# Patient Record
Sex: Female | Born: 1965 | Race: White | Hispanic: No | Marital: Single | Smoking: Current every day smoker
Health system: Southern US, Community
[De-identification: ages and names within clinical notes are randomized; demographics above are authoritative.]

## PROBLEM LIST (undated history)

## (undated) ENCOUNTER — Emergency Department (HOSPITAL_COMMUNITY): Admission: EM | Payer: Self-pay | Source: Home / Self Care

## (undated) DIAGNOSIS — I10 Essential (primary) hypertension: Secondary | ICD-10-CM

## (undated) DIAGNOSIS — J45909 Unspecified asthma, uncomplicated: Secondary | ICD-10-CM

## (undated) DIAGNOSIS — J189 Pneumonia, unspecified organism: Secondary | ICD-10-CM

## (undated) DIAGNOSIS — S0990XA Unspecified injury of head, initial encounter: Secondary | ICD-10-CM

## (undated) DIAGNOSIS — Z9181 History of falling: Secondary | ICD-10-CM

## (undated) DIAGNOSIS — R413 Other amnesia: Secondary | ICD-10-CM

## (undated) DIAGNOSIS — I6200 Nontraumatic subdural hemorrhage, unspecified: Secondary | ICD-10-CM

## (undated) DIAGNOSIS — K219 Gastro-esophageal reflux disease without esophagitis: Secondary | ICD-10-CM

## (undated) HISTORY — PX: BRAIN SURGERY: SHX531

## (undated) HISTORY — DX: Essential (primary) hypertension: I10

## (undated) HISTORY — PX: HERNIA REPAIR: SHX51

---

## 2002-01-28 ENCOUNTER — Emergency Department (HOSPITAL_COMMUNITY): Admission: EM | Admit: 2002-01-28 | Discharge: 2002-01-28 | Payer: Self-pay | Admitting: Emergency Medicine

## 2002-07-05 ENCOUNTER — Inpatient Hospital Stay (HOSPITAL_COMMUNITY): Admission: EM | Admit: 2002-07-05 | Discharge: 2002-07-11 | Payer: Self-pay | Admitting: Psychiatry

## 2005-06-01 ENCOUNTER — Observation Stay (HOSPITAL_COMMUNITY): Admission: EM | Admit: 2005-06-01 | Discharge: 2005-06-03 | Payer: Self-pay | Admitting: Emergency Medicine

## 2005-06-02 ENCOUNTER — Ambulatory Visit: Payer: Self-pay | Admitting: Gastroenterology

## 2005-10-01 ENCOUNTER — Inpatient Hospital Stay (HOSPITAL_COMMUNITY): Admission: EM | Admit: 2005-10-01 | Discharge: 2005-10-05 | Payer: Self-pay | Admitting: Emergency Medicine

## 2005-10-01 ENCOUNTER — Encounter: Payer: Self-pay | Admitting: Obstetrics & Gynecology

## 2005-10-02 ENCOUNTER — Encounter (INDEPENDENT_AMBULATORY_CARE_PROVIDER_SITE_OTHER): Payer: Self-pay | Admitting: *Deleted

## 2006-02-27 ENCOUNTER — Emergency Department (HOSPITAL_COMMUNITY): Admission: EM | Admit: 2006-02-27 | Discharge: 2006-02-27 | Payer: Self-pay | Admitting: Emergency Medicine

## 2006-09-17 ENCOUNTER — Emergency Department (HOSPITAL_COMMUNITY): Admission: EM | Admit: 2006-09-17 | Discharge: 2006-09-18 | Payer: Self-pay | Admitting: Emergency Medicine

## 2008-06-06 ENCOUNTER — Emergency Department (HOSPITAL_COMMUNITY): Admission: EM | Admit: 2008-06-06 | Discharge: 2008-06-06 | Payer: Self-pay | Admitting: Emergency Medicine

## 2008-09-22 ENCOUNTER — Ambulatory Visit: Payer: Self-pay | Admitting: Pulmonary Disease

## 2008-09-22 ENCOUNTER — Encounter: Payer: Self-pay | Admitting: Emergency Medicine

## 2008-09-22 ENCOUNTER — Inpatient Hospital Stay (HOSPITAL_COMMUNITY): Admission: AD | Admit: 2008-09-22 | Discharge: 2008-10-21 | Payer: Self-pay | Admitting: Neurosurgery

## 2008-09-22 ENCOUNTER — Encounter (INDEPENDENT_AMBULATORY_CARE_PROVIDER_SITE_OTHER): Payer: Self-pay | Admitting: Neurosurgery

## 2008-09-22 DIAGNOSIS — F1910 Other psychoactive substance abuse, uncomplicated: Secondary | ICD-10-CM

## 2008-09-28 ENCOUNTER — Ambulatory Visit: Payer: Self-pay | Admitting: Physical Medicine & Rehabilitation

## 2008-10-23 ENCOUNTER — Emergency Department (HOSPITAL_COMMUNITY): Admission: EM | Admit: 2008-10-23 | Discharge: 2008-10-23 | Payer: Self-pay | Admitting: Emergency Medicine

## 2008-10-27 ENCOUNTER — Emergency Department (HOSPITAL_COMMUNITY): Admission: EM | Admit: 2008-10-27 | Discharge: 2008-10-27 | Payer: Self-pay | Admitting: Emergency Medicine

## 2008-11-03 ENCOUNTER — Emergency Department (HOSPITAL_COMMUNITY): Admission: EM | Admit: 2008-11-03 | Discharge: 2008-11-03 | Payer: Self-pay | Admitting: Family Medicine

## 2008-11-18 ENCOUNTER — Emergency Department (HOSPITAL_COMMUNITY): Admission: EM | Admit: 2008-11-18 | Discharge: 2008-11-18 | Payer: Self-pay | Admitting: Emergency Medicine

## 2008-11-23 ENCOUNTER — Ambulatory Visit (HOSPITAL_COMMUNITY): Admission: RE | Admit: 2008-11-23 | Discharge: 2008-11-23 | Payer: Self-pay | Admitting: Neurosurgery

## 2008-12-07 ENCOUNTER — Emergency Department (HOSPITAL_COMMUNITY): Admission: EM | Admit: 2008-12-07 | Discharge: 2008-12-07 | Payer: Self-pay | Admitting: Emergency Medicine

## 2009-01-20 ENCOUNTER — Encounter: Admission: RE | Admit: 2009-01-20 | Discharge: 2009-01-20 | Payer: Self-pay | Admitting: Unknown Physician Specialty

## 2009-02-09 ENCOUNTER — Encounter: Admission: RE | Admit: 2009-02-09 | Discharge: 2009-02-09 | Payer: Self-pay | Admitting: Unknown Physician Specialty

## 2009-04-02 ENCOUNTER — Ambulatory Visit: Payer: Self-pay | Admitting: Internal Medicine

## 2009-04-02 ENCOUNTER — Inpatient Hospital Stay (HOSPITAL_COMMUNITY): Admission: EM | Admit: 2009-04-02 | Discharge: 2009-04-10 | Payer: Self-pay | Admitting: Emergency Medicine

## 2009-04-08 ENCOUNTER — Encounter (INDEPENDENT_AMBULATORY_CARE_PROVIDER_SITE_OTHER): Payer: Self-pay | Admitting: Internal Medicine

## 2009-04-08 DIAGNOSIS — J189 Pneumonia, unspecified organism: Secondary | ICD-10-CM | POA: Insufficient documentation

## 2009-04-08 DIAGNOSIS — F319 Bipolar disorder, unspecified: Secondary | ICD-10-CM | POA: Insufficient documentation

## 2009-04-08 DIAGNOSIS — R569 Unspecified convulsions: Secondary | ICD-10-CM | POA: Insufficient documentation

## 2009-04-08 DIAGNOSIS — I6200 Nontraumatic subdural hemorrhage, unspecified: Secondary | ICD-10-CM

## 2009-04-08 HISTORY — DX: Nontraumatic subdural hemorrhage, unspecified: I62.00

## 2009-05-11 ENCOUNTER — Emergency Department (HOSPITAL_COMMUNITY): Admission: EM | Admit: 2009-05-11 | Discharge: 2009-05-11 | Payer: Self-pay | Admitting: Emergency Medicine

## 2010-04-23 ENCOUNTER — Encounter: Payer: Self-pay | Admitting: Unknown Physician Specialty
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
## 2010-06-18 LAB — STREP PNEUMONIAE ANTIBODY SEROTYPES
Strep pneumo Type 12: 0.31 ug/mL
Strep pneumo Type 19: 1.29 ug/mL
Strep pneumo Type 4: 0.08 ug/mL
Strep pneumo Type 9: 3.9 ug/mL
Strep pneumoniae Type 18C Abs: 9.76 ug/mL
Strep pneumoniae Type 23F Abs: 0.19 ug/mL
Strep pneumoniae Type 3 Abs: 0.27 ug/mL
Strep pneumoniae Type 7F Abs: 0.76 ug/mL
Strep pneumoniae Type 8 Abs: 3.23 ug/mL
Strep pneumoniae Type 9N Abs: 2.19 ug/mL

## 2010-06-18 LAB — BASIC METABOLIC PANEL
BUN: 12 mg/dL (ref 6–23)
BUN: 13 mg/dL (ref 6–23)
BUN: 13 mg/dL (ref 6–23)
BUN: 6 mg/dL (ref 6–23)
BUN: 7 mg/dL (ref 6–23)
BUN: 8 mg/dL (ref 6–23)
CO2: 27 mEq/L (ref 19–32)
CO2: 27 mEq/L (ref 19–32)
CO2: 28 mEq/L (ref 19–32)
Calcium: 7.8 mg/dL — ABNORMAL LOW (ref 8.4–10.5)
Calcium: 7.8 mg/dL — ABNORMAL LOW (ref 8.4–10.5)
Calcium: 8 mg/dL — ABNORMAL LOW (ref 8.4–10.5)
Calcium: 8.2 mg/dL — ABNORMAL LOW (ref 8.4–10.5)
Calcium: 8.4 mg/dL (ref 8.4–10.5)
Chloride: 107 mEq/L (ref 96–112)
Chloride: 111 mEq/L (ref 96–112)
Creatinine, Ser: 0.5 mg/dL (ref 0.4–1.2)
Creatinine, Ser: 0.67 mg/dL (ref 0.4–1.2)
Creatinine, Ser: 0.71 mg/dL (ref 0.4–1.2)
GFR calc Af Amer: >60 mL/min (ref >60)
GFR calc Af Amer: >60 mL/min (ref >60)
GFR calc Af Amer: >60 mL/min (ref >60)
GFR calc Af Amer: >60 mL/min (ref >60)
GFR calc non Af Amer: >60 mL/min (ref >60)
GFR calc non Af Amer: >60 mL/min (ref >60)
GFR calc non Af Amer: >60 mL/min (ref >60)
GFR calc non Af Amer: >60 mL/min (ref >60)
GFR calc non Af Amer: >60 mL/min (ref >60)
GFR calc non Af Amer: >60 mL/min (ref >60)
Glucose, Bld: 117 mg/dL — ABNORMAL HIGH (ref 70–99)
Glucose, Bld: 72 mg/dL (ref 70–99)
Glucose, Bld: 82 mg/dL (ref 70–99)
Glucose, Bld: 96 mg/dL (ref 70–99)
Glucose, Bld: 97 mg/dL (ref 70–99)
Potassium: 3.4 mEq/L — ABNORMAL LOW (ref 3.5–5.1)
Potassium: 4 mEq/L (ref 3.5–5.1)
Potassium: 4 mEq/L (ref 3.5–5.1)
Potassium: 4.1 mEq/L (ref 3.5–5.1)
Sodium: 136 mEq/L (ref 135–145)
Sodium: 137 mEq/L (ref 135–145)
Sodium: 139 mEq/L (ref 135–145)
Sodium: 141 mEq/L (ref 135–145)
Sodium: 141 mEq/L (ref 135–145)
Sodium: 141 mEq/L (ref 135–145)
Sodium: 143 mEq/L (ref 135–145)

## 2010-06-18 LAB — HEMOGLOBIN A1C
Hgb A1c MFr Bld: 5.5 % (ref 4.6–6.1)
Mean Plasma Glucose: 111 mg/dL

## 2010-06-18 LAB — CARDIAC PANEL(CRET KIN+CKTOT+MB+TROPI)
CK, MB: 1.3 ng/mL (ref 0.3–4.0)
Relative Index: INVALID (ref 0.0–2.5)
Relative Index: INVALID (ref 0.0–2.5)
Total CK: 36 U/L (ref 7–177)
Troponin I: 0.01 ng/mL (ref 0.00–0.06)
Troponin I: 0.03 ng/mL (ref 0.00–0.06)

## 2010-06-18 LAB — CBC
HCT: 28.6 % — ABNORMAL LOW (ref 36.0–46.0)
HCT: 28.7 % — ABNORMAL LOW (ref 36.0–46.0)
HCT: 31.1 % — ABNORMAL LOW (ref 36.0–46.0)
HCT: 31.2 % — ABNORMAL LOW (ref 36.0–46.0)
HCT: 33.2 % — ABNORMAL LOW (ref 36.0–46.0)
Hemoglobin: 10.7 g/dL — ABNORMAL LOW (ref 12.0–15.0)
Hemoglobin: 10.8 g/dL — ABNORMAL LOW (ref 12.0–15.0)
Hemoglobin: 9.6 g/dL — ABNORMAL LOW (ref 12.0–15.0)
Hemoglobin: 9.9 g/dL — ABNORMAL LOW (ref 12.0–15.0)
MCHC: 34.5 g/dL (ref 30.0–36.0)
MCHC: 34.6 g/dL (ref 30.0–36.0)
MCHC: 35.1 g/dL (ref 30.0–36.0)
MCHC: 35.3 g/dL (ref 30.0–36.0)
MCV: 97.7 fL (ref 78.0–100.0)
Platelets: 157 10*3/uL (ref 150–400)
Platelets: 173 10*3/uL (ref 150–400)
Platelets: 190 10*3/uL (ref 150–400)
Platelets: 192 10*3/uL (ref 150–400)
Platelets: 288 10*3/uL (ref 150–400)
Platelets: 348 10*3/uL (ref 150–400)
Platelets: 396 10*3/uL (ref 150–400)
RBC: 3.13 MIL/uL — ABNORMAL LOW (ref 3.87–5.11)
RBC: 3.18 MIL/uL — ABNORMAL LOW (ref 3.87–5.11)
RBC: 3.19 MIL/uL — ABNORMAL LOW (ref 3.87–5.11)
RDW: 15.2 % (ref 11.5–15.5)
RDW: 15.3 % (ref 11.5–15.5)
RDW: 15.4 % (ref 11.5–15.5)
RDW: 15.5 % (ref 11.5–15.5)
RDW: 15.6 % — ABNORMAL HIGH (ref 11.5–15.5)
RDW: 15.6 % — ABNORMAL HIGH (ref 11.5–15.5)
WBC: 5.8 10*3/uL (ref 4.0–10.5)
WBC: 8.5 10*3/uL (ref 4.0–10.5)
WBC: 9.8 10*3/uL (ref 4.0–10.5)
WBC: 9.9 10*3/uL (ref 4.0–10.5)

## 2010-06-18 LAB — BRAIN NATRIURETIC PEPTIDE: Pro B Natriuretic peptide (BNP): 243 pg/mL — ABNORMAL HIGH (ref 0.0–100.0)

## 2010-06-18 LAB — CULTURE, BLOOD (ROUTINE X 2): Culture: NO GROWTH

## 2010-06-18 LAB — CK TOTAL AND CKMB (NOT AT ARMC): CK, MB: 1 ng/mL (ref 0.3–4.0)

## 2010-06-18 LAB — DIFFERENTIAL
Basophils Relative: 0 % (ref 0–1)
Eosinophils Absolute: 0 10*3/uL (ref 0.0–0.7)
Eosinophils Relative: 0 % (ref 0–5)
Lymphs Abs: 0.5 10*3/uL — ABNORMAL LOW (ref 0.7–4.0)
Monocytes Absolute: 0.2 10*3/uL (ref 0.1–1.0)
Neutro Abs: 5.1 10*3/uL (ref 1.7–7.7)

## 2010-06-18 LAB — COMPREHENSIVE METABOLIC PANEL
ALT: 21 U/L (ref 0–35)
AST: 20 U/L (ref 0–37)
Albumin: 3 g/dL — ABNORMAL LOW (ref 3.5–5.2)
Alkaline Phosphatase: 49 U/L (ref 39–117)
BUN: 18 mg/dL (ref 6–23)
Chloride: 104 mEq/L (ref 96–112)
GFR calc Af Amer: >60 mL/min (ref >60)
Potassium: 3.3 mEq/L — ABNORMAL LOW (ref 3.5–5.1)
Sodium: 139 mEq/L (ref 135–145)
Total Bilirubin: 1 mg/dL (ref 0.3–1.2)

## 2010-06-18 LAB — LIPID PANEL
LDL Cholesterol: 73 mg/dL (ref 0–99)
Total CHOL/HDL Ratio: 10 RATIO
VLDL: 35 mg/dL (ref 0–40)

## 2010-06-18 LAB — BLOOD GAS, ARTERIAL
Bicarbonate: 25.2 mEq/L — ABNORMAL HIGH (ref 20.0–24.0)
O2 Content: 2 L/min
O2 Saturation: 94.8 %
pCO2 arterial: 43.1 mmHg (ref 35.0–45.0)
pO2, Arterial: 77.6 mmHg — ABNORMAL LOW (ref 80.0–100.0)

## 2010-06-18 LAB — LEGIONELLA ANTIGEN, URINE: Legionella Antigen, Urine: NEGATIVE

## 2010-06-18 LAB — URINE CULTURE: Culture: NO GROWTH

## 2010-06-18 LAB — PHOSPHORUS: Phosphorus: 1.2 mg/dL — ABNORMAL LOW (ref 2.3–4.6)

## 2010-06-18 LAB — MAGNESIUM: Magnesium: 1.8 mg/dL (ref 1.5–2.5)

## 2010-07-09 LAB — URINE MICROSCOPIC-ADD ON

## 2010-07-09 LAB — POCT I-STAT, CHEM 8
Creatinine, Ser: 0.9 mg/dL (ref 0.4–1.2)
Glucose, Bld: 83 mg/dL (ref 70–99)
Hemoglobin: 10.5 g/dL — ABNORMAL LOW (ref 12.0–15.0)
Sodium: 141 mEq/L (ref 135–145)
TCO2: 23 mmol/L (ref 0–100)

## 2010-07-09 LAB — URINALYSIS, ROUTINE W REFLEX MICROSCOPIC
Glucose, UA: NEGATIVE mg/dL
Nitrite: NEGATIVE
Specific Gravity, Urine: 1.028 (ref 1.005–1.030)
pH: 6.5 (ref 5.0–8.0)

## 2010-07-10 LAB — ETHANOL: Alcohol, Ethyl (B): <5 mg/dL (ref 0–10)

## 2010-07-10 LAB — RAPID URINE DRUG SCREEN, HOSP PERFORMED
Amphetamines: NOT DETECTED
Barbiturates: NOT DETECTED
Opiates: NOT DETECTED

## 2010-07-10 LAB — CROSSMATCH: Antibody Screen: NEGATIVE

## 2010-07-10 LAB — BLOOD GAS, ARTERIAL
Acid-Base Excess: 1.4 mmol/L (ref 0.0–2.0)
Bicarbonate: 21.8 mEq/L (ref 20.0–24.0)
Bicarbonate: 23.1 mEq/L (ref 20.0–24.0)
Bicarbonate: 24.6 mEq/L — ABNORMAL HIGH (ref 20.0–24.0)
Bicarbonate: 24.7 mEq/L — ABNORMAL HIGH (ref 20.0–24.0)
FIO2: 0.5 %
O2 Saturation: 98.3 %
O2 Saturation: 99.2 %
O2 Saturation: 97.8 %
O2 Saturation: 98.7 %
PEEP: 5 cmH2O
PEEP: 5 cmH2O
Patient temperature: 37
Patient temperature: 37
Patient temperature: 98.6
TCO2: 19.7 mmol/L (ref 0–100)
TCO2: 21.8 mmol/L (ref 0–100)
TCO2: 25.6 mmol/L (ref 0–100)
pH, Arterial: 7.416 — ABNORMAL HIGH (ref 7.350–7.400)
pO2, Arterial: 205 mmHg — ABNORMAL HIGH (ref 80.0–100.0)
pO2, Arterial: 131 mmHg — ABNORMAL HIGH (ref 80.0–100.0)

## 2010-07-10 LAB — URINALYSIS, ROUTINE W REFLEX MICROSCOPIC
Glucose, UA: NEGATIVE mg/dL
Leukocytes, UA: NEGATIVE
Specific Gravity, Urine: >1.03 — ABNORMAL HIGH (ref 1.005–1.030)
pH: 5.5 (ref 5.0–8.0)

## 2010-07-10 LAB — BASIC METABOLIC PANEL
BUN: 10 mg/dL (ref 6–23)
BUN: 6 mg/dL (ref 6–23)
CO2: 26 mEq/L (ref 19–32)
CO2: 26 mEq/L (ref 19–32)
CO2: 28 mEq/L (ref 19–32)
Calcium: 7 mg/dL — ABNORMAL LOW (ref 8.4–10.5)
Chloride: 105 mEq/L (ref 96–112)
Chloride: 109 mEq/L (ref 96–112)
Chloride: 113 mEq/L — ABNORMAL HIGH (ref 96–112)
Creatinine, Ser: 0.52 mg/dL (ref 0.4–1.2)
Creatinine, Ser: 0.73 mg/dL (ref 0.4–1.2)
GFR calc Af Amer: >60 mL/min (ref >60)
GFR calc non Af Amer: >60 mL/min (ref >60)
Glucose, Bld: 190 mg/dL — ABNORMAL HIGH (ref 70–99)
Potassium: 3.4 mEq/L — ABNORMAL LOW (ref 3.5–5.1)
Sodium: 138 mEq/L (ref 135–145)
Sodium: 140 mEq/L (ref 135–145)
Sodium: 143 mEq/L (ref 135–145)

## 2010-07-10 LAB — CBC
HCT: 37.5 % (ref 36.0–46.0)
Hemoglobin: 13.1 g/dL (ref 12.0–15.0)
Hemoglobin: 9.1 g/dL — ABNORMAL LOW (ref 12.0–15.0)
Hemoglobin: 9.7 g/dL — ABNORMAL LOW (ref 12.0–15.0)
MCHC: 34.4 g/dL (ref 30.0–36.0)
MCHC: 34.4 g/dL (ref 30.0–36.0)
MCV: 96.6 fL (ref 78.0–100.0)
RBC: 3.92 MIL/uL (ref 3.87–5.11)
RBC: 2.92 MIL/uL — ABNORMAL LOW (ref 3.87–5.11)
RDW: 12.7 % (ref 11.5–15.5)
WBC: 15.9 10*3/uL — ABNORMAL HIGH (ref 4.0–10.5)

## 2010-07-10 LAB — COMPREHENSIVE METABOLIC PANEL
Albumin: 3.7 g/dL (ref 3.5–5.2)
Alkaline Phosphatase: 82 U/L (ref 39–117)
BUN: 15 mg/dL (ref 6–23)
Chloride: 110 mEq/L (ref 96–112)
Glucose, Bld: 89 mg/dL (ref 70–99)
Potassium: 4.2 mEq/L (ref 3.5–5.1)
Total Bilirubin: 0.4 mg/dL (ref 0.3–1.2)

## 2010-07-10 LAB — URINE MICROSCOPIC-ADD ON

## 2010-07-10 LAB — POCT CARDIAC MARKERS
CKMB, poc: 2 ng/mL (ref 1.0–8.0)
Myoglobin, poc: 169 ng/mL (ref 12–200)
Troponin i, poc: <0.05 ng/mL (ref 0.00–0.09)

## 2010-07-10 LAB — SALICYLATE LEVEL
Salicylate Lvl: 26.1 mg/dL — ABNORMAL HIGH (ref 2.8–20.0)
Salicylate Lvl: 16.2 mg/dL (ref 2.8–20.0)
Salicylate Lvl: 5.8 mg/dL (ref 2.8–20.0)

## 2010-07-10 LAB — DIFFERENTIAL
Basophils Absolute: 0 10*3/uL (ref 0.0–0.1)
Basophils Relative: 0 % (ref 0–1)
Monocytes Absolute: 0.5 10*3/uL (ref 0.1–1.0)
Neutro Abs: 14.2 10*3/uL — ABNORMAL HIGH (ref 1.7–7.7)

## 2010-07-10 LAB — ABO/RH
ABO/RH(D): O POS
ABO/RH(D): O POS

## 2010-07-13 LAB — DIFFERENTIAL
Basophils Absolute: 0 10*3/uL (ref 0.0–0.1)
Lymphocytes Relative: 28 % (ref 12–46)
Monocytes Absolute: 0.4 10*3/uL (ref 0.1–1.0)
Neutro Abs: 3.5 10*3/uL (ref 1.7–7.7)
Neutrophils Relative %: 60 % (ref 43–77)

## 2010-07-13 LAB — COMPREHENSIVE METABOLIC PANEL
Albumin: 3.8 g/dL (ref 3.5–5.2)
BUN: 6 mg/dL (ref 6–23)
Chloride: 106 mEq/L (ref 96–112)
Creatinine, Ser: 0.75 mg/dL (ref 0.4–1.2)
Total Bilirubin: 0.4 mg/dL (ref 0.3–1.2)
Total Protein: 6.9 g/dL (ref 6.0–8.3)

## 2010-07-13 LAB — CBC
HCT: 40.4 % (ref 36.0–46.0)
MCV: 96.1 fL (ref 78.0–100.0)
Platelets: 374 10*3/uL (ref 150–400)
RDW: 12.6 % (ref 11.5–15.5)

## 2010-07-13 LAB — URINALYSIS, ROUTINE W REFLEX MICROSCOPIC
Hgb urine dipstick: NEGATIVE
Nitrite: NEGATIVE
Specific Gravity, Urine: <1.005 — ABNORMAL LOW (ref 1.005–1.030)
Urobilinogen, UA: 0.2 mg/dL (ref 0.0–1.0)

## 2010-07-13 LAB — RAPID URINE DRUG SCREEN, HOSP PERFORMED
Barbiturates: NOT DETECTED
Opiates: NOT DETECTED
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
## 2010-08-15 NOTE — Consult Note (Signed)
NAME:  Melanie [name], Melanie [name]                  ACCOUNT NO.:   MEDICAL RECORD NO.:  [id number]           PATIENT TYPE:   LOCATION:                                 FACILITY:   PHYSICIAN:  Antonietta Breach, M.D.  DATE OF BIRTH:  [birth year]   DATE OF CONSULTATION:  10/15/2008  DATE OF DISCHARGE:                                 CONSULTATION   Ms. Greenwood has been having mild depressed mood with seizures every  other day.  She has a history of greater than 3 major depression periods  in her past and has a history of Lexapro being effective.   MENTAL STATUS EXAMINATION:  She is able to name 3/3 words immediate and  2/3 words at 3 minutes.  She knows the year, the month, day of the week  and the day of the month.  She also is oriented to place and person.  Her thought process is normal.  She still has some mild trouble with  word finding.  Her judgment however is within normal limits and her  insight is intact.   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  She does have some residual cognitive and memory deficits,  however, she does have the ability to make a consistent choice.  She can  also differentiate between her options of treatment as well as her  options of post discharge environment and their respective risks versus  benefits.  She can appreciate the nature of her general medical  problems.  She also can reason well.   She does have the capacity to leave the hospital against medical advice  if she chooses to do so.   Major depressive disorder recurrent, moderate   Ms. Hatler is not at risk to harm herself or others.  She agrees to  call emergency services immediately for any thoughts of harming herself,  thoughts of harming others or distress.   The indications, alternatives and adverse effects of Celexa were  discussed with the patient for anti-depression.  She understands and  would like to start Celexa.   Would start Celexa 10 mg p.o.  q.a.m. and then increase as tolerated in 1  week to 20 mg q.a.m.   The indications, alternatives and adverse effects were addressed well,  were discussed with the patient for anti-insomnia.  She is continuing  with insomnia.  The patient understands and would like to proceed with  Restoril with a goal of discontinuing Restoril as her main anti-  depression regimen resolves her insomnia, would not use more than  Restoril 15 mg at bedtime p.r.n. insomnia.   Would ask the social worker to set Ms. Cruise up with an outpatient  psychiatric appointment during the first week of discharge.  She had  also been admitted to cognitive behavioral therapy.     Antonietta Breach, M.D.    JW/MEDQ  D:  09/04/2009  T:  09/05/2009  Job:  [id number]

## 2010-08-15 NOTE — Procedures (Signed)
EEG NUMBER:  [id number]   REFERRING PHYSICIAN:  Danae Orleans. Venetia Maxon, MD.   CLINICAL HISTORY:  A 45 year old woman with left subdural hematoma, now  with confusion, and speech difficulty.  EEG is for evaluation.  The  patient had a previous EEG done on September 30, 2008 associated with mild  background slowing indicative of cerebral neuronal dysfunction, but no  focal findings.  Repeat EEG for evaluation.   DESCRIPTION:  The dominant rhythm tracing is intermittently seen  throughout, and it is seen better on the right than on the left.  This  rhythm is a alpha rhythm of 9 Hz, which predominates posteriorly.  Occasional 9 Hz rhythm are seen on the left, but also on occasion  rhythms are relatively slow and is present on the left in the 6-7 Hz  range.  In addition, there is intermittent generalized slowing, which  occurs throughout, with periods of generalized theta waves and some  frontal dominant delta rhythms.  No epileptiform discharges are seen.  Single channel devoted EKG revealed sinus rhythm throughout with a rate  of approximately 60 beats per minute.   CONCLUSIONS:  Abnormal study due to presence of  1. Mild intermittent generalized slowing of the background rhythms,      findings suggestive of diffuse widespread cerebral dysfunction.  2. Intermittent left-sided slowing, findings suggestive of focal      pathology involving the left side.  No epileptiform discharges are      seen.      Michael L. Thad Ranger, M.D.  Electronically Signed     WUJ:WJXB  D:  10/05/2008 23:57:51  T:  10/06/2008 05:36:10  Job #:  [id number]
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
## 2010-08-18 NOTE — Discharge Summary (Signed)
Melanie [name]             ACCOUNT NO.:  [id number]   MEDICAL RECORD NO.:  [id number]          PATIENT TYPE:  OBV   LOCATION:  A228                          FACILITY:  [facility]   PHYSICIAN:  Margaretmary Dys, M.D.DATE OF BIRTH:  1966-02-19   DATE OF ADMISSION:  05/31/2005  DATE OF DISCHARGE:  03/04/2007LH                                 DISCHARGE SUMMARY   DISCHARGE DIAGNOSES:  1.  Refractory hypoglycemia likely secondary to surreptitious use of an oral      hypoglycemic agent or cocaine binging.  2.  Syncope related to #1 above.  3.  Hepatitis B and C positive.  4.  Polysubstance abuse with marijuana, benzodiazepine, crack cocaine,      opiates, and tobacco.   DIET:  Regular diet.   ACTIVITY:  As tolerated.   SPECIAL INSTRUCTIONS:  1.  The patient was advised to quit the use of illicit drug use.  2.  The patient was advised to inform her sexual partners about hepatitis B      positivity. The patient says her boyfriend is aware.   FOLLOWUP:  The patient is to follow up with Dr. Jena Gauss in two weeks for  results of hepatitis B and C viral loads.   DISCHARGE MEDICATIONS:  None.   HOSPITAL COURSE:  Melanie [name] is a 45 year old Caucasian female with no  significant past medical history who was brought into the emergency room  with severe hypoglycemia. Apparently when EMS arrived at her home her blood  sugar was in the 20s. She did receive an amp of D-50, she became alert and  conscious, and also was given some food en route.  Since her arrival in the  emergency room she dropped her blood sugars again in the 70s, she received  three amps of D-50.  Despite the multiple D-50s her blood sugars remained  refractory low, she was admitted, and started on continuous infusion of 5%  dextrose which was subsequently changed to 10% dextrose as she was receiving  multiple doses of 50%. That seemed to have helped.   About 18-24 hours after admission her blood sugars improved and we were  able  to dicontinue her dextrose infusion without any more episodes of  hypoglycemia.   It is unclear why these hypoglycemic episodes have occurred and an extensive  evaluation was done. The patient had an elevated C-peptide level and also  elevated insulin levels. However, because the patient did not become more  hypoglycemic after we stopped her dextrose infusion. It was unlikely that  this is an insulinoma, would be more in favor of illicit drug use.   The patient also had some vague complaints of abdominal pain of unclear  etiology, although she had a ventral hernia that did not appear to be  obstructed or incarcerated.   On June 03, 2005, the patient was much better, had kept her blood sugars up  from more than 12 hours without supplemental dextrose infusion, and was  discharged in satisfactory condition.   DISPOSITION:  Home.   PERTINENT LABORATORY DATA:  White blood cell count was 9.3 with 88%  neutrophils,  hemoglobin 14, MCV 96,  platelet count 323,000. Sodium was 138,  potassium 3.6, chloride 105, bicarbonate 29, glucose 30, BUN 5, creatinine  0.5, calcium 8.3, alcohol level less than 5.  UA shows many squamous cells  and many bacteria, but leukocytes and nitrites were negative. Ketones were  negative. CT of the abdomen and pelvis showed evidence of ventral hernia,  which may be mildly inflamed; otherwise unremarkable. The head CT scan on  admission was negative with no evidence of intracranial abnormality.   A comprehensive workup for the hypoglycemia was also undertaken and AST was  60, ALT 64, albumin 2.9. C-peptide was high at 8.85, upper limits of normal  is 3.90. Thyroid stimulating hormone was down to 0.345. Hepatitis B surface  antibody and Hepatitis C antibody was also positive. Random insulin level  was 40. Urine toxicology was positive for benzodiazepines, cocaines,  opiates, and marijuana.   DISPOSITION:  I discussed with the endocrinologist on call at  [facility] and she was of the opinion that most likely the patient took  oral hypoglycemic agents or overdosed possibly cocaine binging but she will  be happy to see the patient as an outpatient or even in transfer if her  hypoglycemia stays refractory. As I mentioned above, her blood glucose has  improved and stayed that way. She will be discharged as outlined above. The  patient was recommended for psychiatric counseling which she declined during  this hospitalization.      Margaretmary Dys, M.D.  Electronically Signed     AM/MEDQ  D:  06/03/2005  T:  06/03/2005  Job:  [id number]

## 2010-08-18 NOTE — H&P (Signed)
Melanie [name], Melanie [name]             ACCOUNT NO.:  [id number]   MEDICAL RECORD NO.:  [id number]          PATIENT TYPE:  INP   LOCATION:  1823                         FACILITY:  [facility]   PHYSICIAN:  Cherylynn Ridges, M.D.    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   HISTORY OF PRESENT ILLNESS:  The patient has apparently been by several  medical facilities today, one being [facility], one being a hospital up in  [place] and one being at [facility].  She has a history of a known  left lower quadrant mass and was thought possibly to be ovarian cyst,  however, previous CT scan done in June 01, 2005, demonstrated an  incarcerated ventral hernia with some fat in the subcu.  Her current pain  started yesterday and has just got worse, but she has had minimal vomiting,  mild nausea, but this is mostly related to the pain of the hernia.  She has  had no real obstructive symptoms with projectile vomiting or obstipation.  This mass has maybe gotten slightly larger in size, but she comes in now  mostly because of discomfort which has worsened now because of the constant  pushing on her hernia trying to get it reduced.   PAST MEDICAL HISTORY:  Unremarkable.   PAST SURGICAL HISTORY:  1.  Previous carpal tunnel.  2.  C-sections x2.   SOCIAL HISTORY:  She is a smoker, nondrinker.  Otherwise, in no acute  distress.   REVIEW OF SYSTEMS:  She had a normal bowel movement yesterday.  She is very  hungry currently and wants to eat a regular meal.   PHYSICAL EXAMINATION:  VITAL SIGNS:  She is afebrile.  Other vital signs are  stable.  HEENT:  She is normocephalic, atraumatic and anicteric.  She does look  slightly dry.  Her lips are parched.  Tongue is chapped.  NECK:  Neck  is supple.  No palpable masses.  No bruits.  LUNGS:  Lungs are clear to auscultation.  She has no wheezes, rales or  rhonchi.  CARDIAC:  Regular rhythm and rate with no murmurs, gallops, thrills or  heaves.  ABDOMEN:  Her abdomen has a fullness in the left lower quadrant above the  side of a previous transverse Pfannenstiel incision.  The area is somewhat  tender from Dr. Evette Cristal having attempted to reduce the hernia after being  administered pain medication.  She has normoactive bowel sounds.  RECTAL:  Not performed.  PELVIC:  Not performed.   LABORATORY DATA AND X-RAY FINDINGS:  Normal white count.  She is slightly  anemic with hemoglobin of 11.8 and hematocrit of 33%.  Other laboratories  are normal with the exception of mildly elevated SGOT.   IMPRESSION:  Chronic incarcerated hernia.   PLAN:  Because there is possibility it may have changed since March of this  year, I  am repeating a computed axial tomography scan with contrast  currently to make sure she does not have any bowel herniation and  obstruction.  We will go ahead and admit her for pain control and IV  hydration.      Cherylynn Ridges, M.D.  Electronically Signed     JOW/MEDQ  D:  10/01/2005  T:  10/01/2005  Job:  [id number]
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
## 2010-08-18 NOTE — Discharge Summary (Signed)
Melanie [name], Melanie [name]             ACCOUNT NO.:  [id number]   MEDICAL RECORD NO.:  [id number]          PATIENT TYPE:  INP   LOCATION:  5727                         FACILITY:  [facility]   PHYSICIAN:  Angelia Mould. Derrell Lolling, M.D.DATE OF BIRTH:  [birth year]   DATE OF ADMISSION:  10/01/2005  DATE OF DISCHARGE:  10/05/2005                                 DISCHARGE SUMMARY   CHIEF COMPLAINT AND REASON FOR ADMISSION:  [name] is a healthy female  patient who presented with abdominal pain and chronically incarcerated left  lower quadrant ventral hernia.  The patient had been to [facility],  a hospital in [place], [place] as well as [facility] prior to being  evaluated in the ER at [facility].  Initially they thought her symptoms were  related to an ovarian cyst.  On review of CT scans done in the past in March  of this year the patient had an incarcerated ventral hernia with  subcutaneous fat, but the patient apparently was stable and was discharged  after that CT.  She presented back on the date of admission with pain that  initiated 24 hours prior to presentation and has increased significantly  over a period of less than 24 hours.  She has had nausea and minimal  vomiting.   On clinical exam she had no obstructive symptoms and her main complaint is  of pain.  Again, on exam she has fullness of the left lower quadrant above  the site of a previous transverse Pfannenstiel incision.  She is slightly  tender over this area.  There have been prior attempts during the day to  reduce the hernia after pain medications.  Her bowel sounds were present.  Her white count was normal.  She had some mild baseline anemia.  A repeat CT  scan was done and was pending at time of admission.   ADMITTING DIAGNOSES:  1.  Chronic incarcerated hernia.  2.  Tobacco abuse.   HOSPITAL COURSE:  The patient was admitted to the general medical floor  where she was started on p.r.n. Dilaudid IV for pain  control, IV fluids and  made n.p.o. for possible surgical intervention within the next 24 hours.   CT scan showed a lower middle ventral hernia, findings consistent with  incarceration.  Dr. Derrell Lolling discussed findings with the patient and plans  were made to take the patient to the OR for surgical repair due to increased  complaints of pain preoperatively.  The patient was switched to Dilaudid  PCA.   On October 02, 2005, the patient was taken to the OR by Dr. Derrell Lolling where she  underwent repair of a ventral incarcerated hernia with mesh and partial  omentectomy due to preop and postoperative diagnosis of incarcerated ventral  hernia.  She was in stable condition and sent to the PACU to recover and  subsequently sent back to 5700 for the remainder of the hospitalization.  She also had a J-P drain inserted intraoperatively as well.   Over the next few days the patient did extremely well.  She was requiring  high amounts of Dilaudid due  to complaints of pain.  She had large amounts  of J-P drainage out serosanguineous averaging 100 mL or more over a 24-hour  period.  By postop day 2 she was tolerating a regular diet, ambulating well  but still complaining of significant pain.  At this point attempts were made  to wean the patient off PCA and begin Percocet.  Toradol was also used for  pain.   By October 05, 2005, which was postop day 3, the patient was still complaining  of incisional pain but was ambulating in the room and to the bathroom  without difficulty and was able to navigate toileting without any problems.  Her abdomen was slightly distended but soft.  Staple incision was clean, dry  and intact.  With manual pressure no drainage was noted.  She still had a  significant amount of J-P drainage, 170 mL of serosanguineous, so the J-P  drain was left in place.  She was tolerating Percocet and related that she  does well with the addition of NSAIDs as well and felt the Toradol helped,  so she  will continue over-the-counter ibuprofen at home.  The patient  reports that she is unemployed and has significant problems with obtaining  medications, therefore, the recommendation to use over-the-counter ibuprofen  instead of Toradol.  Also worked with case management to assist the patient  in obtaining 4 dollar [employer] prescription for Pepcid to take while she was  taking the extra doses of ibuprofen.  She was also sent home on Percocet for  pain and she was given instructions by nursing staff regarding how to  manage the J-P drain.  Home health services were also requested to help with  management of the J-P drain until this can be discontinued.   DISCHARGE DIAGNOSES:  1.  Incarcerated ventral hernia status post exploratory laparotomy with      placement of mesh and partial omentectomy.  2.  Tobacco abuse.  3.  Pain issues.   DISCHARGE MEDICATIONS:  1.  Percocet 5/325 one or two every 4 hours as needed for pain #40      dispensed.  2.  Over-the-counter ibuprofen, Advil or Aleve as directed on the bottle.      Please use as needed in between Percocet for pain.  Please take with      food.  3.  Over-the-counter Colace, a stool softener.  Take while using the      Percocet.  4.  Famotidine 20 mg 1 b.i.d. while taking ibuprofen.  This is a Estate agent prescription.   DIET:  No restrictions.   ACTIVITY:  Increase activity slowly.  Sponge bathe while J-P drain is in  place.  No driving while taking the Percocet.  No lifting for 6 weeks.  Return to work no sooner than 6 weeks after your initial surgery date.  Dr.  Derrell Lolling will give you further instructions on when you are allowed to return  to work.   WOUND CARE:  Pat staple line dry after bathing.  You have a J-P drain in  place, keep the bulb compressed, empty at least once daily or when full,  write down all amounts and bring this paperwork with you when you follow up  with Dr. Derrell Lolling.  Elenor Quinones:  You are to see Dr.  Derrell Lolling on July 13 at 4:20 p.m.      Allison L. Rennis Harding, N.P.      Angelia Mould. Derrell Lolling, M.D.  Electronically  Signed    ALE/MEDQ  D:  10/05/2005  T:  10/05/2005  Job:  [id number]

## 2010-08-18 NOTE — Discharge Summary (Signed)
Melanie [name], Melanie [name]                       ACCOUNT NO.:  [id number]   MEDICAL RECORD NO.:  [id number]                   PATIENT TYPE:  IPS   LOCATION:  0508                                 FACILITY:  [facility]   PHYSICIAN:  Jeanice Lim, M.D.              DATE OF BIRTH:  [birth year]   DATE OF ADMISSION:  07/05/2002  DATE OF DISCHARGE:  07/11/2002                                 DISCHARGE SUMMARY   IDENTIFYING DATA:  This is a 45 year old divorced Caucasian female,  involuntarily committed, found on [place], had a note in pocket, suspicious  activity for suicidal ideation, smoking crack on and off for eight years,  had been on a three-week binge.   MEDICATIONS:  None.   ALLERGIES:  No known drug allergies.   PHYSICAL EXAMINATION:  Physical exam essentially within normal limits,  neurologically nonfocal.   ROUTINE ADMISSION LABORATORY DATA:  CBC within normal limits.  Alcohol level  200.  Urine drug screen positive for marijuana.   MENTAL STATUS EXAMINATION:  Alert, well-groomed female with good eye  contact, cooperative, speech clear, mood guilty, uncomfortable, affect  anxious, thought processes goal-directed, cognitively intact, judgment and  insight fair with a history of poor impulse control.   ADMITTING DIAGNOSES:   AXES I:  1. Depression, not otherwise specified.  2. Polysubstance abuse including cocaine, marijuana and possible alcohol     dependence.   AXIS II:  None.   AXIS III:  None.   AXIS IV:  Moderate, relatively related to social environment and limited  support system.   AXIS V:  30/60.   HOSPITAL COURSE:  The patient was admitted, ordered routine p.r.n.  medications, underwent further monitoring and was encouraged to participate  in individual, group and milieu therapy as well as substance abuse  treatment.  The patient was placed on low-dose Librium protocol for safe  detox, Protonix, Phenergan for nausea and Trileptal was started for mood  lability.  The patient was optimized on Trileptal and required Librium  p.r.n. in addition to taper due to withdrawal symptoms.  The patient  gradually reported a positive response, tolerating detox with a slight  extension of taper due to prolonged withdrawal symptoms and reported  improvement without clinical intervention.   CONDITION ON DISCHARGE:  Condition on discharge was markedly improved.  Mood  was more euthymic, affect brighter, thought processes goal-directed, thought  content negative for dangerous ideations or psychotic symptoms.  The patient  denied any acute withdrawal symptoms and reported motivation to be compliant  with the aftercare plan  as well as sober and abstinent.   DISCHARGE MEDICATIONS:  The patient was discharged on medications of:  1. Protonix 40 mg daily.  2. Trileptal 150 mg b.i.d. and two q.h.s.  3. Lexapro 10 mg one-half q.a.m.   FOLLOWUP:  The patient was to follow up at [facility] on July 14, 2002 at 3:30 p.m.  DISCHARGE DIAGNOSES:   AXES I:  1. Depression, not otherwise specified.  2. Polysubstance abuse including cocaine, marijuana and possible alcohol     dependence.   AXIS II:  None.   AXIS III:  None.   AXIS IV:  Moderate, relatively related to social environment and limited  support system.   AXIS V:  Global assessment of functioning on discharge was 50 to 55.                                                    Jeanice Lim, M.D.    JEM/MEDQ  D:  07/29/2002  T:  07/30/2002  Job:  [id number]

## 2010-08-18 NOTE — Group Therapy Note (Signed)
Melanie [name], Melanie [name]             ACCOUNT NO.:  [id number]   MEDICAL RECORD NO.:  [id number]          PATIENT TYPE:  OBV   LOCATION:  A228                          FACILITY:  [facility]   PHYSICIAN:  Margaretmary Dys, M.D.DATE OF BIRTH:  June 12, 1965   DATE OF PROCEDURE:  06/02/2005  DATE OF DISCHARGE:                                   PROGRESS NOTE   SUBJECTIVE:  Patient feels well today, has no significant complaints.  Still  has some vague abdominal pain.  Blood sugars have been better controlled on  10% dextrose and currently range from 141-208.  Patient has been weaned down  to 5% dextrose now.  Patient continues to deny taking any illicit drugs or  oral hyperglycemic agents.   She has no fever or chills.  No diarrhea.   OBJECTIVE:  GENERAL APPEARANCE:  Conscious, alert, comfortable, not in acute  distress.  Well oriented to time, place and person.  VITAL SIGNS:  Blood pressure 125/80, pulse 93, respirations 18, T-max 97.1.  Blood sugar has ranged between 141 to 208.  HEENT:  Normocephalic and atraumatic.  Oral mucosa was moist, no exudates.  NECK:  Supple.  No JVD or lymphadenopathy.  LUNGS:  Clear clinically.  CARDIOVASCULAR:  S1 and S2 regular.  ABDOMEN:  Soft and nontender, bowel sounds positive.  Patient has a ventral  hernia.  EXTREMITIES:  No edema.  NEUROLOGIC:  Grossly intact with no focal neurologic deficits.   LABORATORY/DIAGNOSTIC DATA:  White blood cell count was 5.8, hemoglobin  12.4, hematocrit 35.7, platelet count 264, no left shift.  Sodium 141,  chloride 103, CO2 30, glucose 161, BUN 4, creatinine 0.7.  Total bilirubin  0.5, alkaline phosphatase 134, AST 46, ALT 60, total protein 5.1, albumin  2.7, calcium 8.2.  Amylase level 101.  C peptide was 8.85.  Lipase 36.  TSH  0.345.  Hepatitis B surface antibody was positive.  Hepatitis C antibody was  positive.  Random insulin level was 40.   1.  Hyperglycemia which has improved on dextrose infusions.  It is  still      unclear why patient had hypoglycemia but we still strongly suspect      surreptitious use of sulfonyureas  or a hypoglycemic agent.  She seems      to be improving as she has not had any more hyperglycemia episode      despite being on 5% dextrose.  I discussed with the endocrinologist at      [facility], Dr. Jenelle Mages, who also felt that this was likely due      to some kind of drug overdose, advised waiting for urine levels for      sulfonyurea before undergoing any additional testing for  insulinoma.   1.  Hepatitis C positive with elevated liver function tests.  I will request      GI to see the patient. Will hold off requesting ultrasound or viral load      and defer to GI.  2.  Polysubstance abuse.  Patient continues to deny recent use of illicit      drug use.  ACT team consult has been obtained.  Patient will need some      kind of substance abuse program to help her.      Margaretmary Dys, M.D.  Electronically Signed     AM/MEDQ  D:  06/02/2005  T:  06/02/2005  Job:  [id number]

## 2010-08-18 NOTE — Consult Note (Signed)
[name], [name]             ACCOUNT NO.:  [id number]   MEDICAL RECORD NO.:  [id number]          PATIENT TYPE:  OBV   LOCATION:  A228                          FACILITY:  [facility]   PHYSICIAN:  Kassie Mends, M.D.      DATE OF BIRTH:  May 25, 1965   DATE OF CONSULTATION:  06/02/2005  DATE OF DISCHARGE:                                   CONSULTATION   GASTROENTEROLOGY CONSULTATION:   REQUESTING PHYSICIAN:  Margaretmary Dys, M.D.   REASON FOR CONSULTATION:  Elevated liver enzymes.   HISTORY OF PRESENT ILLNESS:  [name] is a 45 year old female who  presented to the emergency department on June 01, 2005 with mental status  changes, possibly seizure, and nausea and vomiting.  She has a past medical  history of suicide ideations in 2004.  Because [name] was having mental  status changes, her friend who found her called EMS.  They checked her  fingerstick, it was 20.  She was given D-50 and initially responded and then  her blood sugar dropped again.  She again had D-50 given.  She was placed on  D-10 at 150 mL/hr and then D-5 normal saline at 75 mL/hr.  Her fingersticks  have been 34 to 298.  She also complains of left lower quadrant abdominal  pain since before Thanksgiving.  She describes it as sharp.  Sometimes it  radiates to her right lower quadrant but it is primarily in the left lower  quadrant.  She has tried Aleve, Tylenol, and Motrin without relief from her  pain.  She denies any vomiting associated with the pain, diarrhea, black  stools or blood in her stool.  Her evaluation in the ED revealed elevated  liver enzymes. Viral serologies revealed prior HBV infection and current HCV  infection. She reports having a history of hepatitis at age 55.  Ten years  ago she snorted cocaine and shared straws with others.   PAST MEDICAL HISTORY:  Depression.   PAST SURGICAL HISTORY:  C-section x2.   ALLERGIES:  No known drug allergies.   MEDICATIONS:  1.  Dilaudid 100 mg for  12 doses.  2.  Reglan 10 mg for 2 doses.  3.  Lortab x2.  4.  Tylox x2.  5.  Phenergan 12.5 mg x2.   FAMILY HISTORY:  There is a family history of diabetes in her grandparents  and her sister.   SOCIAL HISTORY:  She smokes and rarely drinks alcohol.  She is personal pay  for medicines.   REVIEW OF SYSTEMS:  Per the HPI otherwise all systems are negative except  her last menstrual period was February 14.   PHYSICAL EXAMINATION:  VITAL SIGNS:  Temperature 97.1, blood pressure  125/80, pulse 93.GENERAL:  She is in no apparent distress, alert and  oriented x4.HEENT:  Atraumatic, normocephalic.  Pupils equal and reactive to  light.  Mouth:  No oral lesions, posterior pharynx without erythema or  exudate.  Her neck is without lymphadenopathy, has full range of motion.  CHEST:  Her lungs are clear to auscultation bilaterally.  Her cardiovascular  exam is a  regular rhythm, no murmur, normal S1 and S2.ABDOMEN:  Bowel sounds  are present, soft, nondistended, mild deformity about midline in the lower  quadrant, reproducible abdominal pain with raising her head, shoulders and  arms off the bed and holding consistent with a positive Carnett's  sign.EXTREMITIES:  Without cyanosis, clubbing, or edema.NEUROLOGIC:  She has  no focal neurologic deficits.   LABORATORIES:  Fingersticks are 153 to 175, white blood cell count 5.8 and  hemoglobin 12.4, platelets 264, alcohol less than 5, INR 0.9, potassium 3.6,  total bili 0.5, alk phos 134, AST 46, ALT 60, amylase 101, lipase 36, urine  drug screen positive for benzodiazepines, cocaine, opiates, and marijuana.   RADIOGRAPHIC STUDIES:  CT scan shows mildly dilated pancreatic duct and  common bile duct, moderate to large ventral hernia.   ASSESSMENT:  [name] is a 45 year old female who had a prior hepatitis B  virus infection which is now resolved.  She is infected with hepatitis C for  approximately 10 years.  She has a mildly dilated common bile  duct and  pancreatic duct on CT scan of unknown etiology. Thank you for allowing me to  see [name] in consultation.  My list of recommendations follows.   PLAN:  1.  Suggest endoscopic ultrasound to evaluate pancreatic duct and dilated      common bile duct on CT scan.  If entertaining the diagnosis of      insulinoma would recommend EUS as well.  Endoscopic ultrasound would      also be useful in trying to help identify the mass.  2.  She may receive treatment for hepatitis C as an outpatient.  3.  Discussed with the patient the need to completely avoid alcohol.  She      was told not to share toothbrushes with family members.  She was also      told if she ever develops bleeding that her family should wear gloves if      they are assisting her.  4.  Her left lower quadrant pain is musculoskeletal in origin.  Her CT scan      showed no evidence of incarceration of her ventral hernia.  She should      consider surgery consult as an outpatient if her abdominal pain      persists.      Kassie Mends, M.D.  Electronically Signed     SM/MEDQ  D:  06/02/2005  T:  06/02/2005  Job:  [id number]

## 2010-08-18 NOTE — Op Note (Signed)
Melanie [name], Melanie [name]             ACCOUNT NO.:  [id number]   MEDICAL RECORD NO.:  [id number]          PATIENT TYPE:  INP   LOCATION:  5727                         FACILITY:  [facility]   PHYSICIAN:  Angelia Mould. Derrell Lolling, M.D.DATE OF BIRTH:  Mar 05, 1966   DATE OF PROCEDURE:  10/02/2005  DATE OF DISCHARGE:                                 OPERATIVE REPORT   PREOPERATIVE DIAGNOSIS:  Incarcerated ventral incisional hernia.   POSTOPERATIVE DIAGNOSIS:  Incarcerated ventral incisional hernia.   OPERATION PERFORMED:  Repair incarcerated ventral incisional hernia with  mesh, partial omentectomy.   SURGEON:  Angelia Mould. Derrell Lolling, M.D.   OPERATIVE INDICATIONS:  This is a 45 year old white female who has had two  cesarean sections in the past.  For the past few weeks she has noted a lump  in her left lower quadrant.  It became quite painful, recently; and it was  so severe that she came to the emergency room last night and was admitted to  this hospital by Dr. Jimmye Norman.  A CT scan shows a midline ventral  incisional hernia with a large volume of fatty tissue incarcerated in the  subcutaneous tissue through a more narrowed defect.  The small intestine and  colon did not appear to be entering the defect.  The patient remained quite  tender on the mass, but had no GI symptoms, no fever or leukocytosis.  It  was her desire to go ahead and have this repaired, because of her pain; and  she was brought to operating room for that purpose.   OPERATIVE TECHNIQUE:  Following the induction of general endotracheal  anesthesia a Foley catheter was inserted.  Intravenous antibiotics were  given.  The patient's abdomen was prepped and draped in a sterile fashion.  A lower midline incision was made and the dissection carried down through  the subcutaneous tissue until we encountered a hernia sac.  The hernia sac,  itself, was probably 8 cm in diameter or greater.  We dissected this away  from the subcutaneous tissue  and then dissected down to the abdominal wall  fascia.  We dissected the hernia sac away from the fascial edge until we had  completely freed up the hernia sac, and the underlying omentum.  We could  then pass our finger into the peritoneal cavity; and run it all the way  around 360-degrees and see that there were no other incarcerated tissues.  There was a lot of bleeding from the omentum requiring resection of part of  the omentum.   We placed about 8 clamps on the inferior part of the omentum, divided the  bleeding part of the omentum, and removed it.  The omental vessels were then  ligated with 2-0 Vicryl ties.  This provided good hemostasis and the omentum  was healthy and pink.  It was returned the abdominal cavity.  We could see,  or feel no other abnormalities.  The fascia was then closed in the midline  with 5 interrupted sutures of #1 Novofil.  We irrigated the wound.  We took  a 3 inch x 6 inch piece of polypropylene  mesh; and then sewed it as an  anterior onlay repair with 8 interrupted mattress sutures of #0 Prolene.  We  irrigated the wound.  Hemostasis was good.  A 19-French Blake drain was  placed in the wound; and brought out in the left lower quadrant through the  previous Pfannenstiel incision.  This was sutured to the skin with a nylon  suture and connected to a suction bulb.  The subcutaneous tissue was closed  with  interrupted sutures of 2-0 Vicryl.  The skin was closed with skin staples.  Clean bandages were placed; and the patient taken to the recovery room in  stable condition.  Estimated blood loss was about 30 mL. Complications none.  Sponge, needle, and instrument counts were correct.      Angelia Mould. Derrell Lolling, M.D.  Electronically Signed     HMI/MEDQ  D:  10/02/2005  T:  10/02/2005  Job:  [id number]

## 2010-08-18 NOTE — H&P (Signed)
Melanie [name], Melanie [name]                       ACCOUNT NO.:  [id number]   MEDICAL RECORD NO.:  [id number]                   PATIENT TYPE:  IPS   LOCATION:  0508                                 FACILITY:  [facility]   PHYSICIAN:  Jeanice Lim, M.D.              DATE OF BIRTH:  12-03-1965   DATE OF ADMISSION:  07/05/2002  DATE OF DISCHARGE:  07/11/2002                         PSYCHIATRIC ADMISSION ASSESSMENT   IDENTIFYING INFORMATION:  This is a 45 year old divorced white female  involuntarily committed on July 05, 2002.   HISTORY OF PRESENT ILLNESS:  The patient presents on commitment.  The  patient was found [place] a bridge, caught by a motorist, who had called the  sheriff due to her suspicious activities.  She denies that she was trying to  jump.  She states it was not a suicide attempt.  She states she did have a  note in her pocket written to loved ones but denies it was a suicide note.  She reports some depression.  She has been doing some drinking and doing  drugs and smoking some crack cocaine.  She states she drank a pint on July 05, 2002.  Also has been smoking crack cocaine on and off for the past eight  years with a three-week history of binging.  She also reports she has done  some binge-drinking.  She denies any psychotic symptoms.  She is motivated  to remain clean.  She reports she has experienced some mood swings in the  past.   PAST PSYCHIATRIC HISTORY:  First hospitalization at [facility].  Was at [facility] last year for depression.  No other psychiatric  admissions.  No history of a suicide attempt.  She sees Dr. Jenne Campus in  [facility].  Has a history of detox from alcohol.  Her  longest history of sobriety has been 3-4 months.  That was from December to  March.   SOCIAL HISTORY:  This is a 45 year old divorced white female, divorced for  eight years.  First marriage.  Two children, age 25 and 13.  Lives with her  parents.  Her 40 year old  is with her ex-husband.  Her 6-year-old remains  with her.  She stays at home.  She reports legal problems.  No history of a  DUI.   FAMILY HISTORY:  None.   ALCOHOL/DRUG HISTORY:  Smokes a pack a day.  She reports she binge-drinks on  liquor.  Her last drink was on July 05, 2002 where she drank a pint.  She  does not drink in the mornings.  She denies any blackouts or seizures.  She  has been smoking crack cocaine, binging for the past three weeks.  Smoking  on and off for the past eight years.  States her last cocaine use was the  day before admission.   PRIMARY CARE PHYSICIAN:  Dr. Gaye Pollack.   MEDICAL PROBLEMS:  None.  MEDICATIONS:  None.   ALLERGIES:  No known allergies.   PHYSICAL EXAMINATION:  Done at [facility].  The patient is a young,  middle-aged female in no acute distress.  Her available vital signs were  temperature 97.4, heart rate 101, respirations 18, blood pressure 120/70.  She is 4 feet 11 inches tall.  She is 114 pounds.   LABORATORY DATA:  CBC is within normal limits.  Alcohol level is 200.  Urine  drug screen was positive for cocaine and positive for marijuana.   MENTAL STATUS EXAM:  Alert, well-groomed female with good eye contact.  Cooperative.  Speech is clear.  Mood:  The patient feels guilty and  uncomfortable.  She seems anxious.  Her thought processes are coherent with  no evidence of psychosis.  No auditory or visual hallucinations.  No  suicidal or homicidal ideation.  Cognitive function intact.  Memory is good.  Judgment is fair.  Insight is fair.  Poor impulse control.   DIAGNOSES:   AXIS I:  1. Depressive disorder not otherwise specified.  2. Polysubstance abuse.  3. Rule out bipolar disorder.   AXIS II:  Deferred.   AXIS III:  None.   AXIS IV:  Problems with primary support group, social environment, other  psychosocial problems.   AXIS V:  Current 35; past year 15.   PLAN:  Involuntary commitment for depression,  questionable suicidal  ideation, polysubstance abuse.  Contract for safety.  Check every 15  minutes.  Stabilize mood and thinking.  Will initiate the low-dose Librium  to detox safely.  Have clear liquids available for nausea, Protonix for  gastritis.  Will initiate a mood stabilizer.  The patient is to remain  alcohol and drug-free.  Medication-compliance was discussed with the  patient.  The patient to follow up with Dr. Jenne Campus.   TENTATIVE LENGTH OF STAY:  Three to five days.     Landry Corporal, N.P.                       Jeanice Lim, M.D.    JO/MEDQ  D:  07/27/2002  T:  07/28/2002  Job:  [phone]

## 2011-01-17 LAB — URINALYSIS, ROUTINE W REFLEX MICROSCOPIC
Glucose, UA: NEGATIVE
Hgb urine dipstick: NEGATIVE
Specific Gravity, Urine: <1.005 — ABNORMAL LOW
pH: 6.5

## 2011-01-17 LAB — WET PREP, GENITAL
Trich, Wet Prep: NONE SEEN
WBC, Wet Prep HPF POC: NONE SEEN
Yeast Wet Prep HPF POC: NONE SEEN

## 2011-01-17 LAB — GC/CHLAMYDIA PROBE AMP, GENITAL: GC Probe Amp, Genital: NEGATIVE

## 2011-11-23 ENCOUNTER — Inpatient Hospital Stay (HOSPITAL_COMMUNITY)
Admission: EM | Admit: 2011-11-23 | Discharge: 2011-11-30 | DRG: 871 | Disposition: A | Discharge status: Skilled Nursing Facility | Payer: Medicaid Other | Attending: Pulmonary Disease | Admitting: Pulmonary Disease

## 2011-11-23 ENCOUNTER — Emergency Department (HOSPITAL_COMMUNITY): Payer: Medicaid Other

## 2011-11-23 ENCOUNTER — Encounter (HOSPITAL_COMMUNITY): Payer: Self-pay

## 2011-11-23 DIAGNOSIS — J45909 Unspecified asthma, uncomplicated: Secondary | ICD-10-CM | POA: Diagnosis present

## 2011-11-23 DIAGNOSIS — L0291 Cutaneous abscess, unspecified: Secondary | ICD-10-CM

## 2011-11-23 DIAGNOSIS — E871 Hypo-osmolality and hyponatremia: Secondary | ICD-10-CM | POA: Diagnosis present

## 2011-11-23 DIAGNOSIS — R6521 Severe sepsis with septic shock: Secondary | ICD-10-CM | POA: Diagnosis present

## 2011-11-23 DIAGNOSIS — J9600 Acute respiratory failure, unspecified whether with hypoxia or hypercapnia: Secondary | ICD-10-CM

## 2011-11-23 DIAGNOSIS — D703 Neutropenia due to infection: Secondary | ICD-10-CM | POA: Diagnosis present

## 2011-11-23 DIAGNOSIS — F319 Bipolar disorder, unspecified: Secondary | ICD-10-CM

## 2011-11-23 DIAGNOSIS — K219 Gastro-esophageal reflux disease without esophagitis: Secondary | ICD-10-CM | POA: Diagnosis present

## 2011-11-23 DIAGNOSIS — R197 Diarrhea, unspecified: Secondary | ICD-10-CM | POA: Diagnosis present

## 2011-11-23 DIAGNOSIS — E079 Disorder of thyroid, unspecified: Secondary | ICD-10-CM | POA: Diagnosis present

## 2011-11-23 DIAGNOSIS — F1910 Other psychoactive substance abuse, uncomplicated: Secondary | ICD-10-CM

## 2011-11-23 DIAGNOSIS — Z9181 History of falling: Secondary | ICD-10-CM

## 2011-11-23 DIAGNOSIS — A403 Sepsis due to Streptococcus pneumoniae: Principal | ICD-10-CM | POA: Diagnosis present

## 2011-11-23 DIAGNOSIS — R413 Other amnesia: Secondary | ICD-10-CM | POA: Diagnosis present

## 2011-11-23 DIAGNOSIS — I6200 Nontraumatic subdural hemorrhage, unspecified: Secondary | ICD-10-CM

## 2011-11-23 DIAGNOSIS — E876 Hypokalemia: Secondary | ICD-10-CM | POA: Diagnosis present

## 2011-11-23 DIAGNOSIS — R404 Transient alteration of awareness: Secondary | ICD-10-CM | POA: Diagnosis present

## 2011-11-23 DIAGNOSIS — A419 Sepsis, unspecified organism: Secondary | ICD-10-CM

## 2011-11-23 DIAGNOSIS — F17200 Nicotine dependence, unspecified, uncomplicated: Secondary | ICD-10-CM | POA: Diagnosis present

## 2011-11-23 DIAGNOSIS — R6520 Severe sepsis without septic shock: Secondary | ICD-10-CM | POA: Diagnosis present

## 2011-11-23 DIAGNOSIS — Z23 Encounter for immunization: Secondary | ICD-10-CM

## 2011-11-23 DIAGNOSIS — D696 Thrombocytopenia, unspecified: Secondary | ICD-10-CM

## 2011-11-23 DIAGNOSIS — J189 Pneumonia, unspecified organism: Secondary | ICD-10-CM

## 2011-11-23 DIAGNOSIS — R5381 Other malaise: Secondary | ICD-10-CM | POA: Diagnosis present

## 2011-11-23 DIAGNOSIS — J13 Pneumonia due to Streptococcus pneumoniae: Secondary | ICD-10-CM

## 2011-11-23 DIAGNOSIS — R569 Unspecified convulsions: Secondary | ICD-10-CM

## 2011-11-23 HISTORY — DX: History of falling: Z91.81

## 2011-11-23 HISTORY — DX: Gastro-esophageal reflux disease without esophagitis: K21.9

## 2011-11-23 HISTORY — DX: Unspecified asthma, uncomplicated: J45.909

## 2011-11-23 HISTORY — DX: Other amnesia: R41.3

## 2011-11-23 HISTORY — DX: Unspecified injury of head, initial encounter: S09.90XA

## 2011-11-23 LAB — URINALYSIS, ROUTINE W REFLEX MICROSCOPIC
Bilirubin Urine: NEGATIVE
Glucose, UA: NEGATIVE mg/dL
Ketones, ur: NEGATIVE mg/dL
Leukocytes, UA: NEGATIVE
Nitrite: NEGATIVE
Protein, ur: 30 mg/dL — AB
Specific Gravity, Urine: 1.01 (ref 1.005–1.030)
Urobilinogen, UA: 0.2 mg/dL (ref 0.0–1.0)
pH: 6.5 (ref 5.0–8.0)

## 2011-11-23 LAB — COMPREHENSIVE METABOLIC PANEL
AST: 78 U/L — ABNORMAL HIGH (ref 0–37)
Albumin: 2.5 g/dL — ABNORMAL LOW (ref 3.5–5.2)
BUN: 30 mg/dL — ABNORMAL HIGH (ref 6–23)
CO2: 23 mEq/L (ref 19–32)
Calcium: 9.4 mg/dL (ref 8.4–10.5)
Creatinine, Ser: 0.92 mg/dL (ref 0.50–1.10)
GFR calc non Af Amer: 72 mL/min — ABNORMAL LOW (ref >90)

## 2011-11-23 LAB — PROCALCITONIN: Procalcitonin: 15.49 ng/mL

## 2011-11-23 LAB — BLOOD GAS, ARTERIAL
Bicarbonate: 21.6 mEq/L (ref 20.0–24.0)
O2 Content: 2 L/min
O2 Saturation: 91.5 %
Patient temperature: 37
pH, Arterial: 7.409 (ref 7.350–7.450)

## 2011-11-23 LAB — URINE MICROSCOPIC-ADD ON

## 2011-11-23 LAB — CBC WITH DIFFERENTIAL/PLATELET
Eosinophils Absolute: 0 10*3/uL (ref 0.0–0.7)
Lymphs Abs: 0.2 10*3/uL — ABNORMAL LOW (ref 0.7–4.0)
MCH: 32 pg (ref 26.0–34.0)
MCHC: 34.3 g/dL (ref 30.0–36.0)
Monocytes Absolute: 0.1 10*3/uL (ref 0.1–1.0)
Neutrophils Relative %: 72 % (ref 43–77)
Platelets: 172 10*3/uL (ref 150–400)

## 2011-11-23 LAB — TROPONIN I: Troponin I: <0.3 ng/mL (ref <0.30)

## 2011-11-23 LAB — CK TOTAL AND CKMB (NOT AT ARMC): CK, MB: 7.5 ng/mL (ref 0.3–4.0)

## 2011-11-23 MED ORDER — SODIUM CHLORIDE 0.9 % IV SOLN
1000.0000 mL | INTRAVENOUS | Status: DC
  Administered 2011-11-24: 1000 mL via INTRAVENOUS

## 2011-11-23 MED ORDER — VANCOMYCIN HCL IN DEXTROSE 1-5 GM/200ML-% IV SOLN
1000.0000 mg | Freq: Once | INTRAVENOUS | Status: AC
  Administered 2011-11-23: 1000 mg via INTRAVENOUS
  Filled 2011-11-23: qty 200

## 2011-11-23 MED ORDER — SODIUM CHLORIDE 0.9 % IV SOLN: 1000.0000 mL | Freq: Once | INTRAVENOUS | Status: DC

## 2011-11-23 MED ORDER — DEXTROSE 5 % IV SOLN
500.0000 mg | Freq: Once | INTRAVENOUS | Status: AC
  Administered 2011-11-23: 500 mg via INTRAVENOUS
  Filled 2011-11-23: qty 500

## 2011-11-23 MED ORDER — SODIUM CHLORIDE 0.9 % IV SOLN: 1000.0000 mL | INTRAVENOUS | Status: DC

## 2011-11-23 MED ORDER — SODIUM CHLORIDE 0.9 % IV BOLUS (SEPSIS)
1000.0000 mL | Freq: Once | INTRAVENOUS | Status: AC
  Administered 2011-11-23: 1000 mL via INTRAVENOUS

## 2011-11-23 MED ORDER — PIPERACILLIN-TAZOBACTAM 3.375 G IVPB
3.3750 g | Freq: Once | INTRAVENOUS | Status: AC
  Administered 2011-11-23: 3.375 g via INTRAVENOUS
  Filled 2011-11-23: qty 50

## 2011-11-23 MED ORDER — SODIUM CHLORIDE 0.9 % IV SOLN
1000.0000 mL | Freq: Once | INTRAVENOUS | Status: DC
  Administered 2011-11-23: 1000 mL via INTRAVENOUS

## 2011-11-23 MED ORDER — IPRATROPIUM BROMIDE 0.02 % IN SOLN
0.5000 mg | Freq: Once | RESPIRATORY_TRACT | Status: AC
  Administered 2011-11-23: 0.5 mg via RESPIRATORY_TRACT
  Filled 2011-11-23: qty 2.5

## 2011-11-23 MED ORDER — LIDOCAINE-EPINEPHRINE (PF) 1 %-1:200000 IJ SOLN
INTRAMUSCULAR | Status: AC
  Administered 2011-11-23: 22:00:00
  Filled 2011-11-23: qty 10

## 2011-11-23 MED ORDER — METHYLPREDNISOLONE SODIUM SUCC 125 MG IJ SOLR
125.0000 mg | Freq: Once | INTRAMUSCULAR | Status: AC
  Administered 2011-11-23: 125 mg via INTRAVENOUS
  Filled 2011-11-23: qty 2

## 2011-11-23 MED ORDER — ALBUTEROL SULFATE (5 MG/ML) 0.5% IN NEBU
5.0000 mg | INHALATION_SOLUTION | Freq: Once | RESPIRATORY_TRACT | Status: AC
  Administered 2011-11-23: 5 mg via RESPIRATORY_TRACT
  Filled 2011-11-23: qty 1

## 2011-11-23 MED ORDER — MIDAZOLAM HCL 5 MG/5ML IJ SOLN
INTRAMUSCULAR | Status: AC
  Administered 2011-11-23: 22:00:00
  Filled 2011-11-23: qty 5

## 2011-11-23 MED ORDER — SODIUM CHLORIDE 0.9 % IV SOLN: INTRAVENOUS | Status: DC

## 2011-11-23 NOTE — ED Notes (Signed)
CRITICAL VALUE ALERT  Critical value received:  WBC 1.2  Date of notification: 11/23/11  Time of notification:  2013  Critical value read back:yes  Nurse who received alert:  S. Colleen Can, RN  MD notified (1st page):  Dr. Rosalia Hammers  Time of first page:  [phone]

## 2011-11-23 NOTE — ED Notes (Signed)
Patient had a run of SVT, rate up to 205. Dr. Rosalia Hammers at bedside when this occurred. Rate then returned to 118.

## 2011-11-23 NOTE — ED Notes (Signed)
CRITICAL VALUE ALERT  Critical value received:  CKMB 7.5  Date of notification:  11/23/11  Time of notification:  2052  Critical value read back:yes  Nurse who received alert:  Arnetha Courser, RN  MD notified (1st page):  2052  Responding MD:  Dr. Rosalia Hammers

## 2011-11-23 NOTE — ED Notes (Signed)
Complain of being SOB. Pt states she does not know how long. Per EMS, refused IV and CPAP

## 2011-11-23 NOTE — ED Notes (Signed)
Been having trouble breathing for 1 week per pt.

## 2011-11-23 NOTE — ED Provider Notes (Signed)
History     CSN: [id number]  Arrival date & time 11/23/11  4098   First MD Initiated Contact with Patient 11/23/11 1934      Chief Complaint  Patient presents with  . Shortness of Breath    (Consider location/radiation/quality/duration/timing/severity/associated sxs/prior treatment) HPI Patient had brain injury two years ago.  Patient's son giving history due to brain injury and illness.  Son noted multiple falls on Monday and breathing "got bad".  Coughing, smokes, fever unknown.  Decreased po intake.  PMD is Dr. Jorene Guest.  Past Medical History  Diagnosis Date  . Thyroid disease   . GERD (gastroesophageal reflux disease)   . Seizure   . Asthma   . Head injury, acute   . Short-term memory loss   . Risk for falls     Past Surgical History  Procedure Date  . Brain surgery     History reviewed. No pertinent family history.  History  Substance Use Topics  . Smoking status: Unknown If Ever Smoked  . Smokeless tobacco: Not on file  . Alcohol Use: No    OB History    Grav Para Term Preterm Abortions TAB SAB Ect Mult Living                  Review of Systems  Unable to perform ROS   Allergies  Review of patient's allergies indicates no known allergies.  Home Medications  No current outpatient prescriptions on file.  BP 85/45  Pulse 133  Temp 99.1 F (37.3 C) (Oral)  Resp 22  Ht 4\' 11"  (1.499 m)  Wt 140 lb (63.504 kg)  BMI 28.28 kg/m2  SpO2 90%  LMP 11/01/2011  Physical Exam  Nursing note and vitals reviewed. Constitutional: She is oriented to person, place, and time. She appears well-developed and well-nourished.  HENT:  Head: Normocephalic and atraumatic.  Eyes: Conjunctivae and EOM are normal. Pupils are equal, round, and reactive to light.  Neck: Normal range of motion. Neck supple.  Cardiovascular: Regular rhythm.  Tachycardia present.   Pulmonary/Chest:       Diffuse rales  Abdominal: Soft.       Diffuse abdominal tenderness    Musculoskeletal: Normal range of motion.  Neurological: She is alert and oriented to person, place, and time. She has normal reflexes.  Skin: Skin is warm and dry.       Skin mottled  Psychiatric: She has a normal mood and affect.    ED Course  CENTRAL LINE Date/Time: 11/23/2011 10:30 PM Performed by: Hilario Quarry Authorized by: Hilario Quarry Consent: Verbal consent obtained. Written consent obtained.  CENTRAL LINE Date/Time: 11/23/2011 10:31 PM Performed by: Hilario Quarry Authorized by: Hilario Quarry Consent: Verbal consent obtained. Written consent obtained. Risks and benefits: risks, benefits and alternatives were discussed Consent given by: patient and parent Patient understanding: patient states understanding of the procedure being performed Patient consent: the patient's understanding of the procedure matches consent given Patient identity confirmed: verbally with patient and arm band Time out: Immediately prior to procedure a "time out" was called to verify the correct patient, procedure, equipment, support staff and site/side marked as required. Indications: vascular access and central pressure monitoring Anesthesia: local infiltration Local anesthetic: lidocaine 1% with epinephrine Anesthetic total: 2 ml Patient sedated: yes Sedation type: anxiolysis Sedatives: midazolam Vitals: Vital signs were monitored during sedation. Preparation: skin prepped with 2% chlorhexidine Skin prep agent dried: skin prep agent completely dried prior to procedure Sterile barriers: all five  maximum sterile barriers used - cap, mask, sterile gown, sterile gloves, and large sterile sheet Hand hygiene: hand hygiene performed prior to central venous catheter insertion Location details: right internal jugular Patient position: reverse Trendelenburg Catheter type: triple lumen Ultrasound guidance: yes Number of attempts: 2 Successful placement: yes Post-procedure: line sutured and  dressing applied Assessment: blood return through all parts, free fluid flow and placement verified by x-Dazja Houchin Patient tolerance: Patient tolerated the procedure well with no immediate complications.   (including critical care time)  Labs Reviewed - No data to display No results found.   No diagnosis found.  Date: 11/23/2011  Rate: 133  Rhythm: sinus tachycardia  QRS Axis: left  Intervals: normal  ST/T Wave abnormalities: normal  Conduction Disutrbances:none  Narrative Interpretation:   Old EKG Reviewed: none available   Initial valuation and is worrisome for sepsis. Patient has systolic blood pressure of 85/45 heart rate of 133 an oral temperature 99.1. The patient had a rectal temp checked which is 13. Dyspneic and has diffuse rhonchi but abdomen is also distended and diffusely tender. Patient is given a liter of normal saline and her heart rate is decreased to 118 and systolic blood pressure is elevated to 90. She has had Zosyn and vancomycin ordered for sepsis. Her chest x-[facility] has now been reviewed and reveals diffuse left-sided pneumonia and right lower lobe pneumonia and since she has presented from the community Zithromax was added for coverage. ABG is reviewed and she does notappear acidotic at this time. Her oxygen Labs none. Crossing over in [place] and I have discussed this with the lab in a semi-hard copy. Her white blood cell count is 1200. Sodium is 132, potassium 3.3, BUN 30, creatinine 0.92 SGOT 78 elevated, SGPT 38 elevated and as lactic acid 4.3. We're unable to obtain second IV site and I discussed with patient placing a central line. She is in agreement with this and written consent was being obtained   MDM     Patient's care discussed with Dr. Darrick Penna on call for critical care.  Patient is to be given an additional 2 L of normal saline through the central line. We discussed intubation. The patient had some desaturation during the procedure but she was agitated and the  pulse oximeter came off of her finger and it is also unknown if the mass has come off of her face on several occasions. The patient has become less agitated now the process is worn off and the draping is removed. She still convenient continues very tachypneic with speaking to [place] and continues to maintain good oxygen saturation. Protecting her airway well and seems to be stable for transport. I've also discussed her care with Dr. Gustavus Messing who will be assuming her care until transport arrives.  CRITICAL CARE Performed by: Hilario Quarry   Total critical care time:75 Critical care time was exclusive of separately billable procedures and treating other patients.  Critical care was necessary to treat or prevent imminent or life-threatening deterioration.  Critical care was time spent personally by me on the following activities: development of treatment plan with patient and/or surrogate as well as nursing, discussions with consultants, evaluation of patient's response to treatment, examination of patient, obtaining history from patient or surrogate, ordering and performing treatments and interventions, ordering and review of laboratory studies, ordering and review of radiographic studies, pulse oximetry and re-evaluation of patient's condition.      Hilario Quarry, MD 11/23/11 [id number]

## 2011-11-24 ENCOUNTER — Inpatient Hospital Stay (HOSPITAL_COMMUNITY): Payer: Medicaid Other

## 2011-11-24 ENCOUNTER — Encounter (HOSPITAL_COMMUNITY): Payer: Self-pay | Admitting: Neurology

## 2011-11-24 DIAGNOSIS — J9600 Acute respiratory failure, unspecified whether with hypoxia or hypercapnia: Secondary | ICD-10-CM

## 2011-11-24 DIAGNOSIS — A419 Sepsis, unspecified organism: Secondary | ICD-10-CM | POA: Diagnosis present

## 2011-11-24 DIAGNOSIS — J189 Pneumonia, unspecified organism: Secondary | ICD-10-CM

## 2011-11-24 DIAGNOSIS — R569 Unspecified convulsions: Secondary | ICD-10-CM

## 2011-11-24 DIAGNOSIS — R6520 Severe sepsis without septic shock: Secondary | ICD-10-CM

## 2011-11-24 LAB — CBC
MCH: 31.4 pg (ref 26.0–34.0)
MCHC: 33.3 g/dL (ref 30.0–36.0)
MCV: 94.2 fL (ref 78.0–100.0)
Platelets: 121 10*3/uL — ABNORMAL LOW (ref 150–400)
RBC: 3.28 MIL/uL — ABNORMAL LOW (ref 3.87–5.11)

## 2011-11-24 LAB — BASIC METABOLIC PANEL
CO2: 21 mEq/L (ref 19–32)
Glucose, Bld: 141 mg/dL — ABNORMAL HIGH (ref 70–99)
Potassium: 3.7 mEq/L (ref 3.5–5.1)
Sodium: 139 mEq/L (ref 135–145)

## 2011-11-24 LAB — MRSA PCR SCREENING: MRSA by PCR: POSITIVE — AB

## 2011-11-24 LAB — POCT I-STAT 3, ART BLOOD GAS (G3+)
Acid-base deficit: 6 mmol/L — ABNORMAL HIGH (ref 0.0–2.0)
Bicarbonate: 21.3 mEq/L (ref 20.0–24.0)
O2 Saturation: 94 %
Patient temperature: 97.7

## 2011-11-24 LAB — GLUCOSE, CAPILLARY
Glucose-Capillary: 88 mg/dL (ref 70–99)
Glucose-Capillary: 161 mg/dL — ABNORMAL HIGH (ref 70–99)

## 2011-11-24 LAB — CREATININE, SERUM: Creatinine, Ser: 0.73 mg/dL (ref 0.50–1.10)

## 2011-11-24 LAB — CORTISOL: Cortisol, Plasma: 89.8 ug/dL

## 2011-11-24 LAB — STREP PNEUMONIAE URINARY ANTIGEN: Strep Pneumo Urinary Antigen: POSITIVE — AB

## 2011-11-24 LAB — FIBRINOGEN: Fibrinogen: >800 mg/dL — ABNORMAL HIGH (ref 204–475)

## 2011-11-24 MED ORDER — PHENYLEPHRINE HCL 10 MG/ML IJ SOLN: 30.0000 ug/min | INTRAVENOUS | Status: DC | PRN

## 2011-11-24 MED ORDER — DOCUSATE SODIUM 100 MG PO CAPS
100.0000 mg | ORAL_CAPSULE | Freq: Two times a day (BID) | ORAL | Status: DC
  Administered 2011-11-24 – 2011-11-26 (×4): 100 mg via ORAL
  Filled 2011-11-24 (×12): qty 1

## 2011-11-24 MED ORDER — SODIUM CHLORIDE 0.9 % IV BOLUS (SEPSIS): 500.0000 mL | Freq: Once | INTRAVENOUS | Status: DC

## 2011-11-24 MED ORDER — DEXTROSE 5 % IV SOLN
500.0000 mg | Freq: Once | INTRAVENOUS | Status: DC
  Filled 2011-11-24: qty 500

## 2011-11-24 MED ORDER — HYDROCORTISONE SOD SUCCINATE 100 MG IJ SOLR
50.0000 mg | Freq: Four times a day (QID) | INTRAMUSCULAR | Status: DC
  Administered 2011-11-24 – 2011-11-26 (×9): 50 mg via INTRAVENOUS
  Filled 2011-11-24 (×13): qty 1

## 2011-11-24 MED ORDER — POTASSIUM CHLORIDE 10 MEQ/50ML IV SOLN
10.0000 meq | INTRAVENOUS | Status: AC
Start: 2011-11-24 — End: 2011-11-24
  Administered 2011-11-24 (×5): 10 meq via INTRAVENOUS
  Filled 2011-11-24: qty 250

## 2011-11-24 MED ORDER — DOBUTAMINE IN D5W 4-5 MG/ML-% IV SOLN: 2.5000 ug/kg/min | INTRAVENOUS | Status: DC | PRN

## 2011-11-24 MED ORDER — SODIUM CHLORIDE 0.9 % IV SOLN: 250.0000 mL | INTRAVENOUS | Status: DC | PRN

## 2011-11-24 MED ORDER — NOREPINEPHRINE BITARTRATE 1 MG/ML IJ SOLN: 2.0000 ug/min | INTRAVENOUS | Status: DC | PRN

## 2011-11-24 MED ORDER — VANCOMYCIN HCL IN DEXTROSE 1-5 GM/200ML-% IV SOLN
1000.0000 mg | Freq: Once | INTRAVENOUS | Status: DC
  Filled 2011-11-24: qty 200

## 2011-11-24 MED ORDER — MUPIROCIN 2 % EX OINT
1.0000 "application " | TOPICAL_OINTMENT | Freq: Two times a day (BID) | CUTANEOUS | Status: AC
  Administered 2011-11-24 – 2011-11-28 (×10): 1 via NASAL
  Filled 2011-11-24 (×3): qty 22

## 2011-11-24 MED ORDER — PIPERACILLIN-TAZOBACTAM 3.375 G IVPB 30 MIN
3.3750 g | Freq: Once | INTRAVENOUS | Status: DC
  Filled 2011-11-24: qty 50

## 2011-11-24 MED ORDER — CHLORHEXIDINE GLUCONATE CLOTH 2 % EX PADS
6.0000 | MEDICATED_PAD | Freq: Every day | CUTANEOUS | Status: AC
  Administered 2011-11-24 – 2011-11-28 (×5): 6 via TOPICAL

## 2011-11-24 MED ORDER — HEPARIN SODIUM (PORCINE) 5000 UNIT/ML IJ SOLN
5000.0000 [IU] | Freq: Three times a day (TID) | INTRAMUSCULAR | Status: DC
  Administered 2011-11-24 – 2011-11-29 (×16): 5000 [IU] via SUBCUTANEOUS
  Filled 2011-11-24 (×18): qty 1

## 2011-11-24 MED ORDER — PNEUMOCOCCAL VAC POLYVALENT 25 MCG/0.5ML IJ INJ
0.5000 mL | INJECTION | INTRAMUSCULAR | Status: AC
  Administered 2011-11-25: 0.5 mL via INTRAMUSCULAR
  Filled 2011-11-24: qty 0.5

## 2011-11-24 MED ORDER — SODIUM CHLORIDE 0.9 % IV BOLUS (SEPSIS): 500.0000 mL | INTRAVENOUS | Status: DC | PRN

## 2011-11-24 MED ORDER — SODIUM CHLORIDE 0.9 % IV SOLN: INTRAVENOUS | Status: DC

## 2011-11-24 MED ORDER — NORTRIPTYLINE HCL 10 MG PO CAPS
10.0000 mg | ORAL_CAPSULE | Freq: Every day | ORAL | Status: DC
  Administered 2011-11-24 – 2011-11-29 (×6): 10 mg via ORAL
  Filled 2011-11-24 (×7): qty 1

## 2011-11-24 MED ORDER — ALBUTEROL SULFATE (5 MG/ML) 0.5% IN NEBU: 2.5000 mg | INHALATION_SOLUTION | RESPIRATORY_TRACT | Status: DC | PRN

## 2011-11-24 MED ORDER — PANTOPRAZOLE SODIUM 40 MG IV SOLR
40.0000 mg | Freq: Every day | INTRAVENOUS | Status: DC
  Administered 2011-11-24: 40 mg via INTRAVENOUS
  Filled 2011-11-24 (×2): qty 40

## 2011-11-24 MED ORDER — OXYCODONE-ACETAMINOPHEN 5-325 MG PO TABS
1.0000 | ORAL_TABLET | ORAL | Status: DC | PRN
  Administered 2011-11-24 – 2011-11-30 (×19): 1 via ORAL
  Filled 2011-11-24 (×19): qty 1

## 2011-11-24 MED ORDER — PHENYLEPHRINE HCL 10 MG/ML IJ SOLN
25.0000 ug/min | INTRAMUSCULAR | Status: DC
  Administered 2011-11-24: 30 ug/min via INTRAVENOUS
  Filled 2011-11-24 (×2): qty 2

## 2011-11-24 MED ORDER — LEVETIRACETAM 500 MG PO TABS
500.0000 mg | ORAL_TABLET | Freq: Two times a day (BID) | ORAL | Status: DC
  Administered 2011-11-24 – 2011-11-30 (×13): 500 mg via ORAL
  Filled 2011-11-24 (×14): qty 1

## 2011-11-24 MED ORDER — PHENYLEPHRINE HCL 10 MG/ML IJ SOLN
25.0000 ug/min | INTRAVENOUS | Status: DC
  Administered 2011-11-24: 60 ug/min via INTRAVENOUS
  Filled 2011-11-24 (×2): qty 4

## 2011-11-24 MED ORDER — VANCOMYCIN HCL IN DEXTROSE 1-5 GM/200ML-% IV SOLN
1000.0000 mg | INTRAVENOUS | Status: DC
  Administered 2011-11-24 – 2011-11-25 (×2): 1000 mg via INTRAVENOUS
  Filled 2011-11-24 (×2): qty 200

## 2011-11-24 MED ORDER — PHENYLEPHRINE HCL 10 MG/ML IJ SOLN
INTRAMUSCULAR | Status: AC
  Filled 2011-11-24: qty 1

## 2011-11-24 MED ORDER — PIPERACILLIN-TAZOBACTAM 3.375 G IVPB
3.3750 g | Freq: Three times a day (TID) | INTRAVENOUS | Status: DC
  Administered 2011-11-24 – 2011-11-25 (×6): 3.375 g via INTRAVENOUS
  Filled 2011-11-24 (×8): qty 50

## 2011-11-24 MED ORDER — AZITHROMYCIN 500 MG IV SOLR
500.0000 mg | Freq: Every day | INTRAVENOUS | Status: DC
  Administered 2011-11-24 – 2011-11-25 (×2): 500 mg via INTRAVENOUS
  Filled 2011-11-24 (×2): qty 500

## 2011-11-24 NOTE — Progress Notes (Signed)
Dr Herma Carson made aware of blood cultures from [facility]. No new orders.

## 2011-11-24 NOTE — Progress Notes (Signed)
ANTIBIOTIC CONSULT NOTE - INITIAL  Pharmacy Consult for Vancomycin and Zosyn  Indication: rule out sepsis  No Known Allergies  Patient Measurements: Height: 4\' 11"  (149.9 cm) Weight: 145 lb 15.1 oz (66.2 kg) IBW/kg (Calculated) : 43.2  Adjusted Body Weight: 55 kg   Vital Signs: Temp: 99.1 F (37.3 C) (08/23 2336) Temp src: Rectal (08/23 2336) BP: 86/52 mmHg (08/24 0115) Pulse Rate: 112  (08/24 0145) Intake/Output from previous day: 08/23 0701 - 08/24 0700 In: -  Out: 300 [Urine:300] Intake/Output from this shift: Total I/O In: -  Out: 300 [Urine:300]  Labs:  [facility] 11/23/11 1930  WBC 1.2*  HGB 12.9  PLT 172  LABCREA --  CREATININE 0.92   Estimated Creatinine Clearance: 61.9 ml/min (by C-G formula based on Cr of 0.92). No results found for this basename: VANCOTROUGH:2,VANCOPEAK:2,VANCORANDOM:2,GENTTROUGH:2,GENTPEAK:2,GENTRANDOM:2,TOBRATROUGH:2,TOBRAPEAK:2,TOBRARND:2,AMIKACINPEAK:2,AMIKACINTROU:2,AMIKACIN:2, in the last 72 hours   Microbiology: No results found for this or any previous visit (from the past 720 hour(s)).  Medical History: Past Medical History  Diagnosis Date  . Thyroid disease   . GERD (gastroesophageal reflux disease)   . Seizure   . Asthma   . Head injury, acute   . Short-term memory loss   . Risk for falls     Medications:  Prescriptions prior to admission  Medication Sig Dispense Refill  . acetaminophen-codeine (TYLENOL #4) 300-60 MG per tablet Take 1 tablet by mouth 3 (three) times daily as needed.      Marland Kitchen albuterol (VENTOLIN HFA) 108 (90 BASE) MCG/ACT inhaler Inhale 2 puffs into the lungs every 6 (six) hours as needed.      . busPIRone (BUSPAR) 15 MG tablet Take 15 mg by mouth 3 (three) times daily.      . DULoxetine (CYMBALTA) 30 MG capsule Take 30 mg by mouth 2 (two) times daily.      Marland Kitchen levETIRAcetam (KEPPRA) 500 MG tablet Take 500 mg by mouth 2 (two) times daily.      . nortriptyline (PAMELOR) 10 MG capsule Take 10 mg by mouth at  bedtime.      . traZODone (DESYREL) 100 MG tablet Take 100 mg by mouth at bedtime.       Assessment: 46 yo female with sepsis for empiric antibiotics.  Vancomycin 1 g IV in ED at 9 pm last night   Goal of Therapy:  Vancomycin trough level 15-20 mcg/ml  Plan:  Vancomycin 1 g IV q24h Zosyn 3.375 g IV q8h Azithromycin 500 mg IV q24h  Kailah Pennel, Gary Fleet 11/24/2011,2:19 AM

## 2011-11-24 NOTE — H&P (Signed)
Name: Melanie [name] MRN: [id number] DOB: 04/11/1965    LOS: 1  PULMONARY / CRITICAL CARE MEDICINE  HPI:   46 years old female with PMH relevant for Asthma, history of head injury with seizures and short term memory loss. Presented to [facility] with one week history of malaise, fever, chills, productive cough and SOB. At admission to [facility] ED she was found to be hypoxic and hypotensive. Chest X ray showed bilateral infiltrates with very diffuse infiltrates on the entire LLL. Sepsis protocol was initiated, a right IJ triple lumen CVC was placed and the patient was transferred to [place]. At arrival to [facility] ICU she is alert, awake, with persistent hypotension and tachycardia. She is saturating 98% on 100% 6L [place] with a RR of 30/min. Apparently the patient was complaining of abdominal pain but now is resolved.  Current Status: Critical  Vital Signs: Temp:  [97.6 F (36.4 C)-103.5 F (39.7 C)] 97.6 F (36.4 C) (08/24 0400) Pulse Rate:  [85-133] 85  (08/24 0730) Resp:  [17-34] 18  (08/24 0730) BP: (52-113)/(33-78) 94/52 mmHg (08/24 0730) SpO2:  [90 %-100 %] 99 % (08/24 0730) FiO2 (%):  [100 %] 100 % (08/24 0115) Weight:  [63.504 kg (140 lb)-66.2 kg (145 lb 15.1 oz)] 66.2 kg (145 lb 15.1 oz) (08/24 0115)  Physical Examination: General:  Mild to moderate respiratory distress. Neuro:  Awake, alert, oriented x 3, nonfocal, short term memory issues. HEENT:  PERRL, pink conjunctivae, moist membranes Neck:  Supple, no JVD   Cardiovascular:  RRR, no M/R/G Lungs:  Bilateral diffuse crackles L>R Abdomen:  Soft, nontender, nondistended, bowel sounds present Musculoskeletal:  Moves all extremities, no pedal edema Skin:  No rash  ASSESSMENT AND PLAN  PULMONARY  Lab 11/24/11 0355 11/24/11 0142 11/23/11 2030  PHART -- 7.233* 7.409  PCO2ART -- 50.2* 34.9*  PO2ART -- 85.0 61.3*  HCO3 -- 21.3 21.6  O2SAT 72.2 94.0 91.5   Ventilator Settings: Vent Mode:  [-]  FiO2 (%):  [100 %] 100 % CXR:   Left lung multilobar pneumonia.  A:  1) Severe multilobar pneumonia 2) Hypercarbic and hypoxemic respiratory failure. (CO2 34.9 --> 50.2) 3) Septic shock P:   - Will continue supplemental O2 via [place] at 6L per minute  - Patient refused ABG. - Will follow blood, urine and sputum cultures. - Urine legionella and pneumococcal antigen pending. - Early goal directed therapy per sepsis protocol - Will continue Zosyn, Vancomycin and Azithromycin. - Albuterol PRN - Will intubate if respiratory status continues to worsen.  CARDIOVASCULAR  Lab 11/24/11 0230 11/23/11 2051 11/23/11 1930  TROPONINI -- -- <0.30  LATICACIDVEN 2.4* 4.3* --  PROBNP -- -- --   ECG:  Sinus tachycardia. Lines: Right IJ CVC placed today (11/23/11) at [facility] ED  A:  1) Septic shock 2) Lactic acidosis 3) Hyperdynamic heart with normal LV function on bedside echo. (personally done) P:  - Early goal directed therapy per sepsis protocol. - Titrate phenylephrine.  RENAL  Lab 11/24/11 0230 11/23/11 1930  NA -- 132*  K -- 3.3*  CL -- 96  CO2 -- 23  BUN -- 30*  CREATININE 0.73 0.92  CALCIUM -- 9.4  MG -- --  PHOS -- --   Intake/Output      08/23 0701 - 08/24 0700 08/24 0701 - 08/25 0700   I.V. (mL/kg) 348 (5.3)    IV Piggyback 200    Total Intake(mL/kg) 548 (8.3)    Urine (mL/kg/hr) 630 (0.4)  Total Output 630    Net -82           Intake/Output Summary (Last 24 hours) at 11/24/11 0813 Last data filed at 11/24/11 0700  Gross per 24 hour  Intake    548 ml  Output    630 ml  Net    -82 ml   Foley:  Placed today (11/23/11)  A:   1) Preserved kidney function 2) Hypokalemia 3) Hyponatremia legionella? P:   - Continue IVF resuscitation per sepsis protocol - Will replenish K - Follow up BMP in am.  GASTROINTESTINAL  Lab 11/23/11 1930  AST 78*  ALT 38*  ALKPHOS 41  BILITOT 0.8  PROT 6.6  ALBUMIN 2.5*   A:   1) Mildly elevated transaminases, likely secondary to sepsis. Legionella? P:    1) Will follow LFT's.  HEMATOLOGIC  Lab 11/24/11 0230 11/23/11 1930  HGB 10.3* 12.9  HCT 30.9* 37.6  PLT 121* 172  INR -- --  APTT -- --   A:   1) Neutropenia, unclear etiology (sepsis?) P:  1) Will follow CBC in am.  INFECTIOUS  Lab 11/24/11 0230 11/23/11 1930  WBC 2.3* 1.2*  PROCALCITON -- 15.49   Cultures: Blood, urine and sputum cultures sent 11/23/11) Antibiotics: Zosyn 8/23>>> Vancomycin 8/23>>> Azithromycin 8/23>>>  A:   1) Severe community acquired multilobar pneumonia in the setting of neutropenia. 2) Septic shock P:   - Cover broadly and follow cultures, legionella and pneumococcal antigen. - Will deescalate antibiotics with culture results.  ENDOCRINE  Lab 11/24/11 0426  GLUCAP 119*   A:   1) No history of DM  P:   - Will get POCT glucose q8hrs - Check cortisol level. - Stress dose steroids after cortisol level is drawn.  NEUROLOGIC  A:   1) History of head trauma, seizures and memory loss. 2) Awake, alert, oriented and non focal. P:   - Will continue anti seizure medications.  Will continue to titrate down pressors, maintain on low dose IVF and monitor for now, follow CVP and continue abx.  Stress dose steroids after cortisol level is drawn, follow up on culture, titrate O2, will intubate if respiratory status deteriorates.  The patient is critically ill with multiple organ systems failure and requires high complexity decision making for assessment and support, frequent evaluation and titration of therapies, application of advanced monitoring technologies and extensive interpretation of multiple databases.   Critical Care Time devoted to patient care services described in this note is: 73 Hour  Overton Mam, M.D. Pulmonary and Critical Care Medicine [facility] Pager: [phone]  11/24/2011, 8:07 AM

## 2011-11-24 NOTE — Progress Notes (Signed)
**Note De-Identified Quadir Muns Obfuscation** ABG attempted 1x patient became combative and refused the collection of the sample, MD notified

## 2011-11-24 NOTE — H&P (Addendum)
Name: Melanie [name] MRN: [id number] DOB: 26-May-1965    LOS: 1  PULMONARY / CRITICAL CARE MEDICINE  HPI:   46 years old female with PMH relevant for Asthma, history of head injury with seizures and short term memory loss. Presented to [facility] with one week history of malaise, fever, chills, productive cough and SOB. At admission to [facility] ED she was found to be hypoxic and hypotensive. Chest X ray showed bilateral infiltrates with very diffuse infiltrates on the entire LLL. Sepsis protocol was initiated, a right IJ triple lumen CVC was placed and the patient was transferred to [place]. At arrival to [facility] ICU she is alert, awake, with persistent hypotension and tachycardia. She is saturating 98% on 100% NRB with a RR of 24/min. Apparently the patient was complaining of abdominal pain but now is resolved.  Past Medical History  Diagnosis Date  . Thyroid disease   . GERD (gastroesophageal reflux disease)   . Seizure   . Asthma   . Head injury, acute   . Short-term memory loss   . Risk for falls    Past Surgical History  Procedure Date  . Brain surgery    Prior to Admission medications   Medication Sig Start Date End Date Taking? Authorizing Provider  acetaminophen-codeine (TYLENOL #4) 300-60 MG per tablet Take 1 tablet by mouth 3 (three) times daily as needed.   Yes Historical Provider, MD  albuterol (VENTOLIN HFA) 108 (90 BASE) MCG/ACT inhaler Inhale 2 puffs into the lungs every 6 (six) hours as needed.   Yes Historical Provider, MD  busPIRone (BUSPAR) 15 MG tablet Take 15 mg by mouth 3 (three) times daily.   Yes Historical Provider, MD  DULoxetine (CYMBALTA) 30 MG capsule Take 30 mg by mouth 2 (two) times daily.   Yes Historical Provider, MD  levETIRAcetam (KEPPRA) 500 MG tablet Take 500 mg by mouth 2 (two) times daily.   Yes Historical Provider, MD  nortriptyline (PAMELOR) 10 MG capsule Take 10 mg by mouth at bedtime.   Yes Historical Provider, MD  traZODone (DESYREL) 100 MG tablet  Take 100 mg by mouth at bedtime.   Yes Historical Provider, MD   Allergies No Known Allergies  Family History History reviewed. No pertinent family history. Social History 30 packs year smoking history. Currently smoking less than a pack per day. No alcohol or drugs.  Review Of Systems:  All systems reviewed and found negative except for what I mentioned in the HPI. .   Current Status:  Vital Signs: Temp:  [99.1 F (37.3 C)-103.5 F (39.7 C)] 99.1 F (37.3 C) (08/23 2336) Pulse Rate:  [92-133] 112  (08/24 0145) Resp:  [19-31] 19  (08/24 0145) BP: (71-98)/(43-68) 86/52 mmHg (08/24 0115) SpO2:  [90 %-100 %] 98 % (08/24 0145) FiO2 (%):  [100 %] 100 % (08/24 0115) Weight:  [140 lb (63.504 kg)-145 lb 15.1 oz (66.2 kg)] 145 lb 15.1 oz (66.2 kg) (08/24 0115)  Physical Examination: General:  Mild to moderate respiratory distress. Neuro:  Awake, alert, oriented x 3, nonfocal HEENT:  PERRL, pink conjunctivae, moist membranes Neck:  Supple, no JVD   Cardiovascular:  RRR, no M/R/G Lungs:  Bilateral diffuse crackles L>R Abdomen:  Soft, nontender, nondistended, bowel sounds present Musculoskeletal:  Moves all extremities, no pedal edema Skin:  No rash    ASSESSMENT AND PLAN  PULMONARY  Lab 11/24/11 0142 11/23/11 2030  PHART 7.233* 7.409  PCO2ART 50.2* 34.9*  PO2ART 85.0 61.3*  HCO3 21.3 21.6  O2SAT 94.0 91.5   Ventilator Settings: Vent Mode:  [-]  FiO2 (%):  [100 %] 100 % CXR:  Left lung multilobar pneumonia.   A:  1) Severe multilobar pneumonia 2) Hypercarbic and hypoxemic respiratory failure. (CO2 34.9 --> 50.2) 3) Septic shock P:   1) Will continue supplemental O2 via [place] at 6L per minute  2) Will repeat ABG at 4:00 am, may need intubation if worsening hypercarbia 3) Will follow blood, urine and sputum cultures 4) Will get urine legionella and pneumococcal antigen 5) Early goal directed therapy per sepsis protocol 6) Will continue Zosyn, Vancomycin and  Azithromycin. 7) Albuterol PRN   CARDIOVASCULAR  Lab 11/23/11 2051 11/23/11 1930  TROPONINI -- <0.30  LATICACIDVEN 4.3* --  PROBNP -- --   ECG:  Sinus tachycardia. Lines: Right IJ CVC placed today (11/23/11) at [facility] ED  A:  1) Septic shock 2) Lactic acidosis 3) Hyperdynamic heart with normal LV function on bedside echo. (personally done) P:  1) Early goal directed therapy per sepsis protocol. 2) Now on phenylephrine.  RENAL  Lab 11/23/11 1930  NA 132*  K 3.3*  CL 96  CO2 23  BUN 30*  CREATININE 0.92  CALCIUM 9.4  MG --  PHOS --   Intake/Output      08/23 0701 - 08/24 0700   Urine (mL/kg/hr) 300 (0.2)   Total Output 300   Net -300        Foley:  Placed today (11/23/11)  A:   1) Preserved kidney function 2) Hypokalemia 3) Hyponatremia legionella? P:   1) Continue IVF resuscitation per sepsis protocol 2) Will replenish K 3) Follow up CMP in am.  GASTROINTESTINAL  Lab 11/23/11 1930  AST 78*  ALT 38*  ALKPHOS 41  BILITOT 0.8  PROT 6.6  ALBUMIN 2.5*    A:   1) Mildly elevated transaminases, likely secondary to sepsis. Legionella? P:   1) Will follow LFT's  HEMATOLOGIC  Lab 11/23/11 1930  HGB 12.9  HCT 37.6  PLT 172  INR --  APTT --   A:   1) Neutropenia, unclear etiology (sepsis?) P:  1) Will follow CBC in am  INFECTIOUS  Lab 11/23/11 1930  WBC 1.2*  PROCALCITON 15.49   Cultures: Blood, urine and sputum cultures sent 11/23/11) Antibiotics: 1) Zosyn 2) Vancomycin 3) Azithromycin  A:   1) Severe community acquired multilobar pneumonia in the setting of neutropenia. 2) Septic shock P:   1) Will cover broadly and follow cultures, legionella and pneumococcal antigen. 2) Will deescalate antibiotics with culture results.  ENDOCRINE No results found for this basename: GLUCAP:5 in the last 168 hours A:   1) No history of DM  P:   1) Will get POCT glucose q8hrs  NEUROLOGIC  A:   1) History of head trauma, seizures and  memory loss. 2) Awake, alert, oriented and non focal. P:   10 Will continue anti seizure medications.  BEST PRACTICE / DISPOSITION - Level of Care:  ICU - Primary Service:  PCCM - Consultants:  None - Code Status:  Full code - Diet:  NPO - DVT Px:  Heparin - GI Px:  Protonix - Skin Integrity:  Intact - Social / Family:  Son updated at the bedside.  The patient is critically ill with multiple organ systems failure and requires high complexity decision making for assessment and support, frequent evaluation and titration of therapies, application of advanced monitoring technologies and extensive interpretation of multiple databases.  Critical Care Time devoted to patient care services described in this note is: 1 Hour  Overton Mam, M.D. Pulmonary and Critical Care Medicine [facility] Pager: [phone]  11/24/2011, 2:21 AM

## 2011-11-25 ENCOUNTER — Inpatient Hospital Stay (HOSPITAL_COMMUNITY): Payer: Medicaid Other

## 2011-11-25 LAB — BASIC METABOLIC PANEL
BUN: 22 mg/dL (ref 6–23)
Chloride: 108 mEq/L (ref 96–112)
GFR calc Af Amer: >90 mL/min (ref >90)
Glucose, Bld: 118 mg/dL — ABNORMAL HIGH (ref 70–99)
Potassium: 4 mEq/L (ref 3.5–5.1)
Sodium: 140 mEq/L (ref 135–145)

## 2011-11-25 LAB — CBC
HCT: 31.2 % — ABNORMAL LOW (ref 36.0–46.0)
Hemoglobin: 10.3 g/dL — ABNORMAL LOW (ref 12.0–15.0)
RDW: 14.1 % (ref 11.5–15.5)
WBC: 6.9 10*3/uL (ref 4.0–10.5)

## 2011-11-25 LAB — LEGIONELLA ANTIGEN, URINE: Legionella Antigen, Urine: NEGATIVE

## 2011-11-25 LAB — GLUCOSE, CAPILLARY
Glucose-Capillary: 132 mg/dL — ABNORMAL HIGH (ref 70–99)
Glucose-Capillary: 136 mg/dL — ABNORMAL HIGH (ref 70–99)
Glucose-Capillary: 93 mg/dL (ref 70–99)

## 2011-11-25 LAB — PHOSPHORUS: Phosphorus: 2.4 mg/dL (ref 2.3–4.6)

## 2011-11-25 MED ORDER — PANTOPRAZOLE SODIUM 40 MG PO TBEC
40.0000 mg | DELAYED_RELEASE_TABLET | Freq: Every day | ORAL | Status: DC
  Administered 2011-11-25 – 2011-11-26 (×2): 40 mg via ORAL
  Filled 2011-11-25 (×3): qty 1

## 2011-11-25 MED ORDER — GUAIFENESIN 100 MG/5ML PO SOLN
15.0000 mL | ORAL | Status: DC | PRN
  Filled 2011-11-25 (×2): qty 15

## 2011-11-25 MED ORDER — DEXTROSE 5 % IV SOLN
1.0000 g | Freq: Every day | INTRAVENOUS | Status: DC
  Administered 2011-11-26: 1 g via INTRAVENOUS
  Filled 2011-11-25 (×2): qty 10

## 2011-11-25 MED ORDER — GUAIFENESIN-DM 100-10 MG/5ML PO SYRP: 15.0000 mL | ORAL_SOLUTION | ORAL | Status: DC | PRN

## 2011-11-25 MED ORDER — VANCOMYCIN HCL 1000 MG IV SOLR
750.0000 mg | Freq: Two times a day (BID) | INTRAVENOUS | Status: DC
  Administered 2011-11-25 – 2011-11-26 (×2): 750 mg via INTRAVENOUS
  Filled 2011-11-25 (×3): qty 750

## 2011-11-25 NOTE — Progress Notes (Signed)
Name: Melanie [name] MRN: [id number] DOB: Mar 18, 1966    LOS: 2  PULMONARY / CRITICAL CARE MEDICINE  HPI:   46 years old female with PMH relevant for Asthma, history of head injury with seizures and short term memory loss. Presented to [facility] with one week history of malaise, fever, chills, productive cough and SOB. At admission to [facility] ED she was found to be hypoxic and hypotensive. Chest X ray showed bilateral infiltrates with very diffuse infiltrates on the entire LLL. Sepsis protocol was initiated, a right IJ triple lumen CVC was placed and the patient was transferred to [place]. At arrival to [facility] ICU she is alert, awake, with persistent hypotension and tachycardia. She is saturating 98% on 100% 6L [place] with a RR of 30/min. Apparently the patient was complaining of abdominal pain but now is resolved.  Current Status: Critical  Vital Signs: Temp:  [97.3 F (36.3 C)-98.3 F (36.8 C)] 97.6 F (36.4 C) (08/25 0739) Pulse Rate:  [66-108] 69  (08/25 0715) Resp:  [10-26] 12  (08/25 0715) BP: (84-142)/(53-81) 98/61 mmHg (08/25 0715) SpO2:  [88 %-100 %] 100 % (08/25 0715) Weight:  [67.5 kg (148 lb 13 oz)] 67.5 kg (148 lb 13 oz) (08/25 0400)  Physical Examination: General:  Mild to moderate respiratory distress. Neuro:  Awake, alert, oriented x 3, nonfocal, short term memory issues. HEENT:  PERRL, pink conjunctivae, moist membranes Neck:  Supple, no JVD   Cardiovascular:  RRR, no M/R/G Lungs:  Bilateral diffuse crackles L>R Abdomen:  Soft, nontender, nondistended, bowel sounds present Musculoskeletal:  Moves all extremities, no pedal edema Skin:  No rash  ASSESSMENT AND PLAN  PULMONARY  Lab 11/24/11 0355 11/24/11 0142 11/23/11 2030  PHART -- 7.233* 7.409  PCO2ART -- 50.2* 34.9*  PO2ART -- 85.0 61.3*  HCO3 -- 21.3 21.6  O2SAT 72.2 94.0 91.5   Ventilator Settings:   CXR:  Left lung multilobar pneumonia.  A:  1) Severe multilobar pneumonia 2) Hypercarbic and hypoxemic  respiratory failure. (CO2 34.9 --> 50.2) 3) Septic shock P:   - Will continue supplemental O2 via   - Patient refused ABG. - Culture per ID section. - Urine legionella and pneumococcal antigen negative. - Early goal directed therapy per sepsis protocol complete. - Abx per ID section. - Albuterol PRN - Will intubate if respiratory status continues to worsen.  CARDIOVASCULAR  Lab 11/24/11 0230 11/23/11 2051 11/23/11 1930  TROPONINI -- -- <0.30  LATICACIDVEN 2.4* 4.3* --  PROBNP -- -- --   ECG:  Sinus tachycardia. Lines: Right IJ CVC placed today (11/23/11) at [facility] ED  A:  1) Septic shock 2) Lactic acidosis 3) Hyperdynamic heart with normal LV function on bedside echo. (personally done) P:  - Early goal directed therapy per sepsis protocol complete. - Titrate phenylephrine as tolerated.  RENAL  Lab 11/25/11 0420 11/24/11 1220 11/24/11 0230 11/23/11 1930  NA 140 139 -- 132*  K 4.0 3.7 -- --  CL 108 109 -- 96  CO2 25 21 -- 23  BUN 22 20 -- 30*  CREATININE 0.68 0.65 0.73 0.92  CALCIUM 9.0 8.5 -- 9.4  MG 2.2 -- -- --  PHOS 2.4 -- -- --   Intake/Output      08/24 0701 - 08/25 0700 08/25 0701 - 08/26 0700   P.O. 300    I.V. (mL/kg) 1233.9 (18.3)    IV Piggyback 685    Total Intake(mL/kg) 2218.9 (32.9)    Urine (mL/kg/hr) 840 (0.5)  Total Output 840    Net +1378.9           Intake/Output Summary (Last 24 hours) at 11/25/11 1610 Last data filed at 11/25/11 0700  Gross per 24 hour  Intake 2146.4 ml  Output    840 ml  Net 1306.4 ml   Foley:  Placed today (11/23/11)  A:   1) Preserved kidney function 2) Hypokalemia addressed 3) Hyponatremia addressed 4) Low UOP P:   - Continue IVF resuscitation, increase NS to 75 ml/hr. - Will replenish K as needed. - Follow up BMP in am.  GASTROINTESTINAL  Lab 11/23/11 1930  AST 78*  ALT 38*  ALKPHOS 41  BILITOT 0.8  PROT 6.6  ALBUMIN 2.5*   A:   1) Mildly elevated transaminases, likely secondary to  sepsis. Legionella? P:   1) Will follow LFT's.  HEMATOLOGIC  Lab 11/25/11 0420 11/24/11 0230 11/23/11 1930  HGB 10.3* 10.3* 12.9  HCT 31.2* 30.9* 37.6  PLT 123* 121* 172  INR -- -- --  APTT -- -- --   A:   1) Neutropenia, unclear etiology (sepsis?) P:  1) Will follow CBC in am.  INFECTIOUS  Lab 11/25/11 0420 11/24/11 0230 11/23/11 1930  WBC 6.9 2.3* 1.2*  PROCALCITON -- -- 15.49   Cultures: Blood, urine and sputum cultures sent 11/23/11) Blood with GPC in pairs 2/2 Sputum NTD Urine NTD Antibiotics: Zosyn 8/23>>> Vancomycin 8/23>>> Azithromycin 8/23>>>  A:   1) Severe community acquired multilobar pneumonia in the setting of neutropenia. 2) Septic shock P:   - Continue current abx until cultures are finalized. - Will likely d/c zithromax on 8/26.  ENDOCRINE  Lab 11/25/11 0345 11/24/11 1939 11/24/11 1130 11/24/11 0426  GLUCAP 110* 161* 88 119*   A:   1) No history of DM  P:   - Will get POCT glucose q8hrs. - Cortisol level of 89.8 - Unclear if cortisol was drawn before or after hydrocortisone, will continue stress dose steroids while patient is on pressors and hypotensive then taper down after.  NEUROLOGIC  A:   1) History of head trauma, seizures and memory loss. 2) Awake, alert, oriented and non focal. P:   - Will continue anti seizure medications.  Will titrate pressors down as tolerated, monitor respiratory status, if improved will transfer out in AM.  CC time of 35 min.  Alyson Reedy, M.D. [facility] Pulmonary/Critical Care Medicine. Pager: [phone]. After hours pager: [phone].

## 2011-11-25 NOTE — Progress Notes (Signed)
CRITICAL VALUE ALERT  Critical value received:  Gram + cocci in pairs grown in blood cultures  Date of notification:  08.25.13  Time of notification:  15:03  Critical value read back:yes  Nurse who received alert:  Levander Campion BSN RN  MD notified (1st page):  Dr. Marin Shutter  Time of first page:  15:04  MD notified (2nd page):  Time of second page:  Responding MD:  Dr. Marin Shutter  Time MD responded:  15:04

## 2011-11-25 NOTE — Progress Notes (Signed)
ANTIBIOTIC CONSULT NOTE - FOLLOW UP  Pharmacy Consult for Vancomycin and Zosyn Indication: pneumonia  No Known Allergies  Patient Measurements: Height: 4\' 11"  (149.9 cm) Weight: 148 lb 13 oz (67.5 kg) IBW/kg (Calculated) : 43.2   Vital Signs: Temp: 97.6 F (36.4 C) (08/25 0739) Temp src: Oral (08/25 0739) BP: 118/78 mmHg (08/25 0900) Pulse Rate: 92  (08/25 0900) Intake/Output from previous day: 08/24 0701 - 08/25 0700 In: 2218.9 [P.O.:300; I.V.:1233.9; IV Piggyback:685] Out: 840 [Urine:840] Intake/Output from this shift: Total I/O In: 47.4 [I.V.:34.9; IV Piggyback:12.5] Out: 85 [Urine:85]  Labs:  Basename 11/25/11 0420 11/24/11 1220 11/24/11 0230 11/23/11 1930  WBC 6.9 -- 2.3* 1.2*  HGB 10.3* -- 10.3* 12.9  PLT 123* -- 121* 172  LABCREA -- -- -- --  CREATININE 0.68 0.65 0.73 --   Estimated Creatinine Clearance: 71.8 ml/min (by C-G formula based on Cr of 0.68). No results found for this basename: VANCOTROUGH:2,VANCOPEAK:2,VANCORANDOM:2,GENTTROUGH:2,GENTPEAK:2,GENTRANDOM:2,TOBRATROUGH:2,TOBRAPEAK:2,TOBRARND:2,AMIKACINPEAK:2,AMIKACINTROU:2,AMIKACIN:2, in the last 72 hours   Microbiology: Recent Results (from the past 720 hour(s))  CULTURE, BLOOD (ROUTINE X 2)     Status: Normal (Preliminary result)   Collection Time   11/23/11  8:13 PM      Component Value Range Status Comment   Specimen Description BLOOD RIGHT HAND   Final    Special Requests BOTTLES DRAWN AEROBIC AND ANAEROBIC [facility]   Final    Culture  Setup Time 11/24/2011 20:05   Final    Culture     Final    Value:        BLOOD CULTURE RECEIVED NO GROWTH TO DATE CULTURE WILL BE HELD FOR 5 DAYS BEFORE ISSUING A FINAL NEGATIVE REPORT   Report Status PENDING   Incomplete   CULTURE, BLOOD (ROUTINE X 2)     Status: Normal (Preliminary result)   Collection Time   11/23/11  8:14 PM      Component Value Range Status Comment   Specimen Description BLOOD RIGHT WRIST   Final    Special Requests BOTTLES DRAWN AEROBIC ONLY  [facility]   Final    Culture  Setup Time 11/24/2011 20:05   Final    Culture     Final    Value:        BLOOD CULTURE RECEIVED NO GROWTH TO DATE CULTURE WILL BE HELD FOR 5 DAYS BEFORE ISSUING A FINAL NEGATIVE REPORT   Report Status PENDING   Incomplete   MRSA PCR SCREENING     Status: Abnormal   Collection Time   11/24/11  1:15 AM      Component Value Range Status Comment   MRSA by PCR POSITIVE (*) NEGATIVE Final   Strep pneumo urine antigen: positive  Anti-infectives     Start     Dose/Rate Route Frequency Ordered Stop   11/24/11 2200   azithromycin (ZITHROMAX) 500 mg in dextrose 5 % 250 mL IVPB        500 mg 250 mL/hr over 60 Minutes Intravenous Daily at bedtime 11/24/11 0225     11/24/11 1000   vancomycin (VANCOCIN) IVPB 1000 mg/200 mL premix        1,000 mg 200 mL/hr over 60 Minutes Intravenous Every 24 hours 11/24/11 0225     11/24/11 0400   vancomycin (VANCOCIN) IVPB 1000 mg/200 mL premix  Status:  Discontinued        1,000 mg 200 mL/hr over 60 Minutes Intravenous  Once 11/24/11 0213 11/24/11 0215   11/24/11 0400   azithromycin (ZITHROMAX) 500 mg in dextrose  5 % 250 mL IVPB  Status:  Discontinued        500 mg 250 mL/hr over 60 Minutes Intravenous  Once 11/24/11 0213 11/24/11 0215   11/24/11 0400  piperacillin-tazobactam (ZOSYN) IVPB 3.375 g       3.375 g 12.5 mL/hr over 240 Minutes Intravenous 3 times per day 11/24/11 0225     11/24/11 0300   piperacillin-tazobactam (ZOSYN) IVPB 3.375 g  Status:  Discontinued        3.375 g 100 mL/hr over 30 Minutes Intravenous  Once 11/24/11 0213 11/24/11 0215   11/23/11 2045   azithromycin (ZITHROMAX) 500 mg in dextrose 5 % 250 mL IVPB        500 mg 250 mL/hr over 60 Minutes Intravenous  Once 11/23/11 2042 11/24/11 0008   11/23/11 2000  piperacillin-tazobactam (ZOSYN) IVPB 3.375 g       3.375 g 12.5 mL/hr over 240 Minutes Intravenous  Once 11/23/11 1953 11/24/11 0044   11/23/11 2000   vancomycin (VANCOCIN) IVPB 1000 mg/200 mL premix          1,000 mg 200 mL/hr over 60 Minutes Intravenous  Once 11/23/11 1953 11/23/11 2214          Assessment: Pneumonia:  On Day #3 of antibiotic therapy with Vancomycin, Zosyn, and Azithromycin.  She is now off pressors and her renal function is stable based on evaluation of her serum creatinine and urine output.  Her Zosyn and Azithromycin doses are appropriate but her Vancomycin dose can be increased to improve target attainment.  Also note her S. pneumo urine antigen is positive, which may aid with narrowing her antimicrobial coverage.  Goal of Therapy:  Vancomycin trough level 15-20 mcg/ml  Plan:  Increase Vancomycin to 750mg  IV Q12h Continue Zosyn 3.375gm IV q8h extended infusion Continue Azithromycin 500mg  IV q24h Will follow remaining micro data, including Legionella antigen Monitor renal function and urine output Will discuss possible antibiotic de-escalation with CCM MD on 8/26.  Estella Husk, Pharm.D., BCPS Clinical Pharmacist  Phone [phone] Pager [phone] 11/25/2011, 11:09 AM

## 2011-11-26 ENCOUNTER — Inpatient Hospital Stay (HOSPITAL_COMMUNITY): Payer: Medicaid Other

## 2011-11-26 DIAGNOSIS — F1910 Other psychoactive substance abuse, uncomplicated: Secondary | ICD-10-CM

## 2011-11-26 DIAGNOSIS — F319 Bipolar disorder, unspecified: Secondary | ICD-10-CM

## 2011-11-26 LAB — TYPE AND SCREEN: Antibody Screen: NEGATIVE

## 2011-11-26 LAB — CBC
HCT: 34.7 % — ABNORMAL LOW (ref 36.0–46.0)
Hemoglobin: 11.3 g/dL — ABNORMAL LOW (ref 12.0–15.0)
MCHC: 32.6 g/dL (ref 30.0–36.0)
MCV: 96.4 fL (ref 78.0–100.0)

## 2011-11-26 LAB — BASIC METABOLIC PANEL
BUN: 24 mg/dL — ABNORMAL HIGH (ref 6–23)
Creatinine, Ser: 0.7 mg/dL (ref 0.50–1.10)
GFR calc non Af Amer: >90 mL/min (ref >90)
Glucose, Bld: 99 mg/dL (ref 70–99)
Potassium: 4.2 mEq/L (ref 3.5–5.1)

## 2011-11-26 LAB — POCT I-STAT 3, ART BLOOD GAS (G3+)
Bicarbonate: 22.7 mEq/L (ref 20.0–24.0)
O2 Saturation: 97 %
TCO2: 24 mmol/L (ref 0–100)
pCO2 arterial: 38.4 mmHg (ref 35.0–45.0)
pH, Arterial: 7.38 (ref 7.350–7.450)

## 2011-11-26 LAB — GLUCOSE, CAPILLARY: Glucose-Capillary: 85 mg/dL (ref 70–99)

## 2011-11-26 LAB — CULTURE, BLOOD (ROUTINE X 2)

## 2011-11-26 MED ORDER — DEXTROSE 5 % IV SOLN
1.0000 g | Freq: Every day | INTRAVENOUS | Status: DC
  Administered 2011-11-26 – 2011-11-28 (×3): 1 g via INTRAVENOUS
  Filled 2011-11-26 (×5): qty 10

## 2011-11-26 MED ORDER — METHYLPREDNISOLONE SODIUM SUCC 40 MG IJ SOLR
40.0000 mg | Freq: Two times a day (BID) | INTRAMUSCULAR | Status: DC
  Administered 2011-11-26 – 2011-11-28 (×4): 40 mg via INTRAVENOUS
  Filled 2011-11-26 (×5): qty 1

## 2011-11-26 MED ORDER — HYDROCORTISONE SOD SUCCINATE 100 MG IJ SOLR: 25.0000 mg | Freq: Four times a day (QID) | INTRAMUSCULAR | Status: DC

## 2011-11-26 MED ORDER — FUROSEMIDE 10 MG/ML IJ SOLN
20.0000 mg | Freq: Two times a day (BID) | INTRAMUSCULAR | Status: AC
  Administered 2011-11-26 – 2011-11-28 (×4): 20 mg via INTRAVENOUS
  Filled 2011-11-26 (×4): qty 2

## 2011-11-26 NOTE — Progress Notes (Signed)
Pt diaphoretic, complains of chest pain, back pain, and headache. Applied heat pack to back. EKG completed. Dr. Tyson Alias notified. Will continue to monitor pt.

## 2011-11-26 NOTE — Evaluation (Signed)
Physical Therapy Evaluation Patient Details Name: Melanie [name] MRN: [id number] DOB: [birth year] Today's Date: 11/26/2011 Time: 7829-5621 PT Time Calculation (min): 26 min  PT Assessment / Plan / Recommendation Clinical Impression  pt presents with Multilobar PNA, Septic Schock, Resp Failure, history TBI and polysubstance.  pt needs max encouragement for participation.  pt notes she lives alone and unsure level of A from family.  pt would benefit from SNF at D/C.      PT Assessment  Patient needs continued PT services    Follow Up Recommendations  Skilled nursing facility    Barriers to Discharge None      Equipment Recommendations  None recommended by PT    Recommendations for Other Services OT consult   Frequency Min 3X/week    Precautions / Restrictions Precautions Precautions: Fall Restrictions Weight Bearing Restrictions: No   Pertinent Vitals/Pain Initially pt states no pain, then states pain in abdomen, head, back.  RN aware and has premedicated pt.        Mobility  Bed Mobility Bed Mobility: Not assessed Transfers Transfers: Sit to Stand;Stand to Sit Sit to Stand: 4: Min assist;With upper extremity assist;From chair/3-in-1;With armrests Stand to Sit: 4: Min guard;With upper extremity assist;With armrests;To chair/3-in-1 Details for Transfer Assistance: cues for use of UEs, max encouragement and control descent to chair.   Ambulation/Gait Ambulation/Gait Assistance: Not tested (comment) Stairs: No Wheelchair Mobility Wheelchair Mobility: No    Exercises     PT Diagnosis: Difficulty walking;Acute pain  PT Problem List: Decreased activity tolerance;Decreased mobility;Decreased balance;Decreased cognition;Decreased knowledge of use of DME;Cardiopulmonary status limiting activity;Pain PT Treatment Interventions: DME instruction;Gait training;Stair training;Functional mobility training;Therapeutic activities;Therapeutic exercise;Balance training;Cognitive  remediation;Patient/family education   PT Goals Acute Rehab PT Goals PT Goal Formulation: With patient Time For Goal Achievement: 12/10/11 Potential to Achieve Goals: Good Pt will go Supine/Side to Sit: with modified independence PT Goal: Supine/Side to Sit - Progress: Goal set today Pt will go Sit to Supine/Side: with modified independence PT Goal: Sit to Supine/Side - Progress: Goal set today Pt will go Sit to Stand: with modified independence PT Goal: Sit to Stand - Progress: Goal set today Pt will go Stand to Sit: with modified independence PT Goal: Stand to Sit - Progress: Goal set today Pt will Ambulate: >150 feet;with modified independence;with rolling walker PT Goal: Ambulate - Progress: Goal set today Pt will Go Up / Down Stairs: 3-5 stairs;with supervision;with rail(s) PT Goal: Up/Down Stairs - Progress: Goal set today  Visit Information  Last PT Received On: 11/26/11 Assistance Needed: +1    Subjective Data  Subjective: I don't feel well.   Patient Stated Goal: Home   Prior Functioning  Home Living Lives With: Alone Available Help at Discharge: Family (? level of A) Type of Home: House Home Access: Stairs to enter [employer] of Steps: couple Entrance Stairs-Rails:  (one rail unsure which side) Home Layout: One level Home Adaptive Equipment: Walker - rolling Additional Comments: pt at times states "I don't know."  and is unclear on Home situation and PLOF.   Prior Function Level of Independence: Independent with assistive device(s) Able to Take Stairs?: Yes Driving: Yes Vocation: On disability Communication Communication: No difficulties    Cognition  Overall Cognitive Status: History of cognitive impairments - further impaired Area of Impairment: Attention;Memory;Safety/judgement;Awareness of deficits;Problem solving Arousal/Alertness: Awake/alert Orientation Level: Disoriented to;Time;Place;Situation Behavior During Session: [facility] for tasks  performed Current Attention Level: Selective Memory Deficits: pt with decreased recall of home situation and presents situation.  Safety/Judgement: Decreased safety judgement for tasks assessed;Decreased awareness of need for assistance Cognition - Other Comments: pt with history of TBI and unclear prior level of cognition.  Per MD notes pt has baseline of some cognitive deficits.      Extremity/Trunk Assessment Right Lower Extremity Assessment RLE ROM/Strength/Tone: WFL for tasks assessed Left Lower Extremity Assessment LLE ROM/Strength/Tone: WFL for tasks assessed Trunk Assessment Trunk Assessment: Normal   Balance Balance Balance Assessed: No  End of Session PT - End of Session Equipment Utilized During Treatment: Gait belt Activity Tolerance: Patient tolerated treatment well Patient left: in chair;with call bell/phone within reach Nurse Communication: Mobility status  GP     Sunny Schlein, [place] [phone] 11/26/2011, 11:50 AM

## 2011-11-26 NOTE — Progress Notes (Signed)
Clinical Social Worker noted current PT recommendations for SNF.  CSW reviewed chart and attempted to meet with pt at bedside.  Pt currently saying, "I need to be cut off.  I need to be cut off."  When asked for clarification, pt stated, "I'm not trying to be ugly, but I think you know what that means."   Pt currently crying.  CSW offered to have RN come to see pt; pt agreeable.  RN notified and went to room.  CSW to continue to follow and assist as needed.  Angelia Mould, MSW, [place] [phone]

## 2011-11-26 NOTE — Progress Notes (Signed)
Spoke with Dr. Frederico Hamman regarding multiple unsuccessful attempts for lab draw for TSH. Will move this lab to be drawn with am labs. Also alerted MD to pt's increasing BP. Will continue to monitor for now.

## 2011-11-26 NOTE — Progress Notes (Signed)
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 Presented to [facility] with one week history of malaise, fever, chills, productive cough and SOB. At admission to [facility] ED she was found to be hypoxic and hypotensive. Chest X ray showed bilateral infiltrates with very diffuse infiltrates on the entire LLL. Sepsis protocol was initiated, a right IJ triple lumen CVC was placed and the patient was transferred to [place]. At arrival to [facility] ICU she is alert, awake, with persistent hypotension and tachycardia. She is saturating 98% on 100% 6L [place] with a RR of 30/min. Apparently the patient was complaining of abdominal pain but now is resolved.  Current Status:  in chair  Vital Signs: Temp:  [97.3 F (36.3 C)-98.3 F (36.8 C)] 97.3 F (36.3 C) (08/26 0800) Pulse Rate:  [46-96] 73  (08/26 1100) Resp:  [11-31] 24  (08/26 1100) BP: (111-145)/(71-106) 136/77 mmHg (08/26 1000) SpO2:  [77 %-100 %] 98 % (08/26 1100) Weight:  [69.8 kg (153 lb 14.1 oz)] 69.8 kg (153 lb 14.1 oz) (08/26 0400)  Physical Examination: General:  Moderate distress Neuro:  Awake, alert, oriented x 3 HEENT:  PERRL, pink conjunctivae, moist membranes Neck:  Supple, no JVD   Cardiovascular:  RRR, no M/R/G Lungs:  Reduced, mild wheezing Abdomen:  Soft, nontender, nondistended, bowel sounds present Musculoskeletal:  Moves all extremities, no pedal edema Skin:  No rash  ASSESSMENT AND PLAN  PULMONARY  Lab 11/24/11 0355 11/24/11 0142 11/23/11 2030  PHART -- 7.233* 7.409  PCO2ART -- 50.2* 34.9*  PO2ART -- 85.0 61.3*  HCO3 -- 21.3 21.6  O2SAT 72.2 94.0 91.5   Ventilator Settings:   CXR:  Left lung multilobar pneumonia.  A:  1) Severe multilobar pneumonia 2) Hypercarbic and hypoxemic respiratory failure. (CO2 34.9 --> 50.2) 3) Septic shock P:    - O2 support -NIMV to reduce wob and avoid ETT, plan bipap 4 hr son 6 hours off -pcxr in am  -still may require ett -limit diet -hydrocortisone to solumedrol, off pressors, wheezing, h;o asthma Lasix to neg balance  CARDIOVASCULAR  Lab 11/24/11 0230 11/23/11 2051 11/23/11 1930  TROPONINI -- -- <0.30  LATICACIDVEN 2.4* 4.3* --  PROBNP -- -- --   ECG:  Sinus tachycardia. Lines: Right IJ CVC placed today (11/23/11) at [facility] ED  A:  1) Septic shock 2) Lactic acidosis 3) Hyperdynamic heart with normal LV function on bedside echo. (personally done) P:  - off pressors -Int brady, no further neo abg if she allows  ecg  - Sinus tsh ensure  RENAL  Lab 11/26/11 0354 11/25/11 0420 11/24/11 1220 11/24/11 0230 11/23/11 1930  NA 140 140 139 -- 132*  K 4.2 4.0 -- -- --  CL 109 108 109 -- 96  CO2 23 25 21  -- 23  BUN 24* 22 20 -- 30*  CREATININE 0.70 0.68 0.65 0.73 0.92  CALCIUM 9.4 9.0 8.5 -- 9.4  MG 1.9 2.2 -- -- --  PHOS 3.0 2.4 -- -- --   Intake/Output      08/25 0701 - 08/26 0700 08/26 0701 - 08/27 0700   P.O. 600    I.V. (mL/kg) 1416.2 (20.3) 300 (4.3)   IV Piggyback 775    Total Intake(mL/kg) 2791.2 (40) 300 (4.3)   Urine (mL/kg/hr) 1090 (0.7)  200 (0.6)   Total Output 1090 200   Net +1701.2 +100          Intake/Output Summary (Last 24 hours) at 11/26/11 1158 Last data filed at 11/26/11 1100  Gross per 24 hour  Intake 2561.25 ml  Output   1125 ml  Net 1436.25 ml   Foley:  Placed today (11/23/11)  A:   1) Preserved kidney function 2) Hypokalemia addressed 3) Hyponatremia addressed 4) Low UOP P:   - KVo Lasix x 2 Neg 1 lit goal bmet in am   GASTROINTESTINAL  Lab 11/23/11 1930  AST 78*  ALT 38*  ALKPHOS 41  BILITOT 0.8  PROT 6.6  ALBUMIN 2.5*   A:   1) Mildly elevated transaminases, likely secondary to sepsis. Legionella? P:   1) Will follow LFT's. -ppi To full diet  HEMATOLOGIC  Lab 11/26/11 0354 11/25/11 0420 11/24/11 0230 11/23/11  1930  HGB 11.3* 10.3* 10.3* 12.9  HCT 34.7* 31.2* 30.9* 37.6  PLT 125* 123* 121* 172  INR -- -- -- --  APTT -- -- -- --   A:   1) Neutropenia, unclear etiology (sepsis?) P:  Sub qh hemoconcetration  INFECTIOUS  Lab 11/26/11 0354 11/25/11 0420 11/24/11 0230 11/23/11 1930  WBC 15.5* 6.9 2.3* 1.2*  PROCALCITON -- -- -- 15.49   Cultures: Blood, urine and sputum cultures sent 11/23/11) Blood with GPC in pairs 2/2 Sputum NTD Urine NTD Antibiotics: Zosyn 8/23>>>off Vancomycin 8/23>>>off Azithromycin 8/23>>>off Ceftriaxone 8/26>>> plan stop 30th  A:   1) Severe community acquired multilobar pneumonia in the setting of neutropenia. 2) Septic shock P:   - to ceftriaxone for pansens strep  ENDOCRINE  Lab 11/26/11 0726 11/26/11 0117 11/25/11 1515 11/25/11 1143 11/25/11 0753  GLUCAP 92 85 93 136* 132*   A:   1) No history of DM  P:   - Will get POCT glucose q8hrs. - steroids for lung , dc rel ai  NEUROLOGIC  A:   1) History of head trauma, seizures and memory loss. 2) Awake, alert, oriented and non focal. P:   - Will continue anti seizure medications. Chair nimv  Ccm time 30 min , NIMV, distress  Mcarthur Rossetti. Tyson Alias, MD, FACP Pgr: [phone] [facility]

## 2011-11-27 ENCOUNTER — Inpatient Hospital Stay (HOSPITAL_COMMUNITY): Payer: Medicaid Other

## 2011-11-27 DIAGNOSIS — I319 Disease of pericardium, unspecified: Secondary | ICD-10-CM

## 2011-11-27 LAB — CBC WITH DIFFERENTIAL/PLATELET
Eosinophils Relative: 0 % (ref 0–5)
Hemoglobin: 12 g/dL (ref 12.0–15.0)
Lymphocytes Relative: 5 % — ABNORMAL LOW (ref 12–46)
MCH: 32.1 pg (ref 26.0–34.0)
Monocytes Absolute: 0.3 10*3/uL (ref 0.1–1.0)
Neutrophils Relative %: 92 % — ABNORMAL HIGH (ref 43–77)
Platelets: 114 10*3/uL — ABNORMAL LOW (ref 150–400)
RBC: 3.74 MIL/uL — ABNORMAL LOW (ref 3.87–5.11)
WBC Morphology: INCREASED
WBC: 15.8 10*3/uL — ABNORMAL HIGH (ref 4.0–10.5)

## 2011-11-27 LAB — GLUCOSE, CAPILLARY
Glucose-Capillary: 110 mg/dL — ABNORMAL HIGH (ref 70–99)
Glucose-Capillary: 118 mg/dL — ABNORMAL HIGH (ref 70–99)
Glucose-Capillary: 139 mg/dL — ABNORMAL HIGH (ref 70–99)

## 2011-11-27 LAB — BASIC METABOLIC PANEL
BUN: 20 mg/dL (ref 6–23)
CO2: 21 mEq/L (ref 19–32)
Chloride: 108 mEq/L (ref 96–112)
Creatinine, Ser: 0.55 mg/dL (ref 0.50–1.10)
Glucose, Bld: 140 mg/dL — ABNORMAL HIGH (ref 70–99)
Potassium: 4.1 mEq/L (ref 3.5–5.1)

## 2011-11-27 MED ORDER — ALBUTEROL SULFATE (5 MG/ML) 0.5% IN NEBU
2.5000 mg | INHALATION_SOLUTION | Freq: Four times a day (QID) | RESPIRATORY_TRACT | Status: DC
  Administered 2011-11-27 – 2011-11-29 (×7): 2.5 mg via RESPIRATORY_TRACT
  Filled 2011-11-27 (×6): qty 0.5

## 2011-11-27 MED ORDER — IPRATROPIUM BROMIDE 0.02 % IN SOLN
0.5000 mg | Freq: Four times a day (QID) | RESPIRATORY_TRACT | Status: DC
  Administered 2011-11-27 – 2011-11-29 (×7): 0.5 mg via RESPIRATORY_TRACT
  Filled 2011-11-27 (×6): qty 2.5

## 2011-11-27 NOTE — Progress Notes (Signed)
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 Presented to [facility] with one week history of malaise, fever, chills, productive cough and SOB. At admission to [facility] ED she was found to be hypoxic and hypotensive. Chest X ray showed bilateral infiltrates with very diffuse infiltrates on the entire LLL. Sepsis protocol was initiated, a right IJ triple lumen CVC was placed and the patient was transferred to [place]. At arrival to [facility] ICU she is alert, awake, with persistent hypotension and tachycardia. She is saturating 98% on 100% 6L [place] with a RR of 30/min. Apparently the patient was complaining of abdominal pain but now is resolved.  Current Status:  in chair Neg 2.1 liters  Vital Signs: Temp:  [97.3 F (36.3 C)-98.2 F (36.8 C)] 97.3 F (36.3 C) (08/27 1201) Pulse Rate:  [54-94] 91  (08/27 0900) Resp:  [15-33] 22  (08/27 0900) BP: (129-163)/(67-128) 144/128 mmHg (08/27 0900) SpO2:  [91 %-99 %] 92 % (08/27 0900) Weight:  [67 kg (147 lb 11.3 oz)] 67 kg (147 lb 11.3 oz) (08/27 0400)  Physical Examination: General:  Less distress Neuro:  Awake, alert, oriented x 3 HEENT:  PERRL, pink conjunctivae, moist membranes Neck:  Supple, wnl JVD   Cardiovascular:  RRR, no M/R/G Lungs: rt coarse Abdomen:  Soft, nontender, nondistended, bowel sounds present Musculoskeletal:  Moves all extremities, no pedal edema Skin:  No rash  ASSESSMENT AND PLAN  PULMONARY  Lab 11/26/11 1227 11/24/11 0355 11/24/11 0142 11/23/11 2030  PHART 7.380 -- 7.233* 7.409  PCO2ART 38.4 -- 50.2* 34.9*  PO2ART 89.0 -- 85.0 61.3*  HCO3 22.7 -- 21.3 21.6  O2SAT 97.0 72.2 94.0 91.5   Ventilator Settings:   CXR:  Left lung multilobar pneumonia.  A:  1) Severe multilobar pneumonia 2) Hypercarbic and hypoxemic respiratory failure. (CO2  34.9 --> 50.2) P:   - O2 support -did not use NIMV, did not require -scheduled treatments, continue prn -pcxr in am  -IS Lasix when able  CARDIOVASCULAR  Lab 11/24/11 0230 11/23/11 2051 11/23/11 1930  TROPONINI -- -- <0.30  LATICACIDVEN 2.4* 4.3* --  PROBNP -- -- --   ECG:  Sinus tachycardia. Lines: Right IJ CVC placed today (11/23/11) at [facility] ED  A:  3) Hyperdynamic heart with normal LV function on bedside echo. (personally done) bardycardia asymptomatic P:  - off pressors -Int brady, no further neo abg reviewed -brady from head injury? And int hypoxia? Support ecg pr wnl Echo assess effusion, low voltage tsh wnl  RENAL  Lab 11/27/11 0650 11/26/11 0354 11/25/11 0420 11/24/11 1220 11/24/11 0230 11/23/11 1930  NA 145 140 140 139 -- 132*  K 4.1 4.2 -- -- -- --  CL 108 109 108 109 -- 96  CO2 21 23 25 21  -- 23  BUN 20 24* 22 20 -- 30*  CREATININE 0.55 0.70 0.68 0.65 0.73 --  CALCIUM 8.8 9.4 9.0 8.5 -- 9.4  MG -- 1.9 2.2 -- -- --  PHOS -- 3.0 2.4 -- -- --   Intake/Output      08/26 0701 - 08/27 0700 08/27 0701 - 08/28 0700   P.O. 660    I.V. (mL/kg) 810 (12.1) 20 (0.3)   IV Piggyback 52    Total Intake(mL/kg) 1522 (22.7) 20 (0.3)   Urine (  mL/kg/hr) 3700 (2.3) 500 (1.4)   Total Output 3700 500   Net -2178 -480        Urine Occurrence  1 x   Stool Occurrence 5 x 3 x     Intake/Output Summary (Last 24 hours) at 11/27/11 1228 Last data filed at 11/27/11 0900  Gross per 24 hour  Intake   1047 ml  Output   4000 ml  Net  -2953 ml   Foley:  Placed today (11/23/11)  A:   1) Preserved kidney function 2) Hypokalemia addressed:   - KVo Lasix continue Neg 1 lit goal, repeat this balance bmet in am   GASTROINTESTINAL  Lab 11/23/11 1930  AST 78*  ALT 38*  ALKPHOS 41  BILITOT 0.8  PROT 6.6  ALBUMIN 2.5*   A:   1) Mildly elevated transaminases, likely secondary to sepsis. Legionella? P:   -ppi, dc as not indicated To reg, no  nimv  HEMATOLOGIC  Lab 11/26/11 0354 11/25/11 0420 11/24/11 0230 11/23/11 1930  HGB 11.3* 10.3* 10.3* 12.9  HCT 34.7* 31.2* 30.9* 37.6  PLT 125* 123* 121* 172  INR -- -- -- --  APTT -- -- -- --   A:   1) Neutropenia, unclear etiology (sepsis?), hemoconcetrating P:  Sub qh hemoconcetration continue, lasix  INFECTIOUS  Lab 11/26/11 0354 11/25/11 0420 11/24/11 0230 11/23/11 1930  WBC 15.5* 6.9 2.3* 1.2*  PROCALCITON -- -- -- 15.49   Cultures: Blood, urine and sputum cultures sent 11/23/11) Blood with GPC in pairs 2/2 Sputum NTD Urine NTD Antibiotics: Zosyn 8/23>>>off Vancomycin 8/23>>>off Azithromycin 8/23>>>off Ceftriaxone 8/26>>> plan stop 30th  A:   1) Severe community acquired multilobar pneumonia in the setting of neutropenia. 2) Septic shock P:   - to ceftriaxone for pansens strep Stop dat ein place, better aeration rt base  ENDOCRINE  Lab 11/27/11 0742 11/27/11 0014 11/26/11 1704 11/26/11 0726 11/26/11 0117  GLUCAP 139* 118* 80 92 85   A:   1) No history of DM  P:   - Will get POCT glucose q8hrs. - steroids for lung , reduce in am after wheezing improved Dc cbg  NEUROLOGIC  A:   1) History of head trauma, seizures and memory loss. 2) Awake, alert, oriented and non focal. P:   - Will continue anti seizure medications. Chair  To sdu  Mcarthur Rossetti. Tyson Alias, MD, FACP Pgr: [phone] [facility]
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
## 2011-11-27 NOTE — Progress Notes (Signed)
IV team RN attempted to initiate new IV site, however was unsuccessful. Dr. Tyson Alias gave verbal order to maintain right PIV for 24 hrs.

## 2011-11-27 NOTE — Progress Notes (Signed)
Clinical Social Worker met with pt's father at bedside Brett Canales Sr.).  CSW introduced self and intern-Angela.  Pt and family agreeable to both CSW and intern being present.  CSW explained role and provided support.  CSW reviewed PT recommendations for SNF.  CSW reviewed SNF process.  Pt and family agreeable.  Pt and family given opportunity to process question and concerns related SNF process; all addressed.  Pt and family expressed interest in SNFs near [place], [place] due to close proximity to home (~40miles).  Pt expressed difficulty with feeling "anxious".  CSW reviewed mindfulness techniques to assist with decreasing feelings of anxiety.  Pt and family expressed gratitude for intervention.  CSW to continue to follow and assist as needed.    Angelia Mould, MSW, [place] [phone]

## 2011-11-27 NOTE — Progress Notes (Signed)
  Echocardiogram 2D Echocardiogram with Definity has been performed.  Delvon Chipps 11/27/2011, 3:38 PM

## 2011-11-27 NOTE — Clinical Social Work Placement (Addendum)
    Clinical Social Work Department CLINICAL SOCIAL WORK PLACEMENT NOTE 11/27/2011  Patient:  Melanie [name], Melanie [name]  Account Number:  [id number] Admit date:  11/23/2011  Clinical Social Worker:  Margaree Mackintosh  Date/time:  11/27/2011 04:31 PM  Clinical Social Work is seeking post-discharge placement for this patient at the following level of care:   SKILLED NURSING   (*CSW will update this form in Epic as items are completed)   11/27/2011  Patient/family provided with [facility] Department of Clinical Social Work's list of facilities offering this level of care within the geographic area requested by the patient (or if unable, by the patient's family).  11/27/2011  Patient/family informed of their freedom to choose among providers that offer the needed level of care, that participate in Medicare, Medicaid or managed care program needed by the patient, have an available bed and are willing to accept the patient.  11/27/2011  Patient/family informed of [facility]' ownership interest in [facility], as well as of the fact that they are under no obligation to receive care at this facility.  PASARR submitted to EDS on 11/28/11 PASARR number received from EDS on 11/28/11 - Patient has existing PASARR number  FL2 transmitted to all facilities in geographic area requested by pt/family on  11/27/2011 FL2 transmitted to all facilities within larger geographic area on 11/28/11 (MOE)  Patient informed that his/her managed care company has contracts with or will negotiate with  certain facilities, including the following:     Patient/family informed of bed offers received: 11/30/11  Patient chooses bed at [facility] Physician recommends and patient chooses bed at    Patient to be transferred to [facility] on 11/30/11   Patient to be transferred to facility by ambulance. The following physician request were entered in Epic:   Additional  Comments: Family expressed interest in [place], [place] area. 11/30/11: CSW talked with patient again regarding SNF placement for ST rehab and her preference is [facility]. 11/30/11: Call made to facility and they can accept her today. Patient has PASARR number and the Medicaid prior approval has been submitted. The facility will be forwarded this information when received.

## 2011-11-28 ENCOUNTER — Inpatient Hospital Stay (HOSPITAL_COMMUNITY): Payer: Medicaid Other

## 2011-11-28 LAB — BASIC METABOLIC PANEL
BUN: 17 mg/dL (ref 6–23)
Creatinine, Ser: 0.54 mg/dL (ref 0.50–1.10)
GFR calc Af Amer: >90 mL/min (ref >90)
GFR calc non Af Amer: >90 mL/min (ref >90)
Potassium: 2.8 mEq/L — ABNORMAL LOW (ref 3.5–5.1)

## 2011-11-28 LAB — GLUCOSE, CAPILLARY
Glucose-Capillary: 133 mg/dL — ABNORMAL HIGH (ref 70–99)
Glucose-Capillary: 160 mg/dL — ABNORMAL HIGH (ref 70–99)

## 2011-11-28 MED ORDER — METHYLPREDNISOLONE SODIUM SUCC 40 MG IJ SOLR
20.0000 mg | Freq: Two times a day (BID) | INTRAMUSCULAR | Status: DC
  Administered 2011-11-28: 20 mg via INTRAVENOUS
  Filled 2011-11-28 (×3): qty 0.5

## 2011-11-28 MED ORDER — FUROSEMIDE 10 MG/ML IJ SOLN
20.0000 mg | Freq: Two times a day (BID) | INTRAMUSCULAR | Status: DC
  Administered 2011-11-28 – 2011-11-29 (×2): 20 mg via INTRAVENOUS
  Filled 2011-11-28 (×4): qty 2

## 2011-11-28 MED ORDER — ONDANSETRON HCL 4 MG/2ML IJ SOLN
4.0000 mg | Freq: Four times a day (QID) | INTRAMUSCULAR | Status: DC | PRN
  Administered 2011-11-28 – 2011-11-30 (×4): 4 mg via INTRAVENOUS
  Filled 2011-11-28 (×4): qty 2

## 2011-11-28 MED ORDER — TRAZODONE HCL 100 MG PO TABS
100.0000 mg | ORAL_TABLET | Freq: Every day | ORAL | Status: DC
  Administered 2011-11-28 – 2011-11-29 (×2): 100 mg via ORAL
  Filled 2011-11-28 (×4): qty 1

## 2011-11-28 MED ORDER — BUSPIRONE HCL 15 MG PO TABS
15.0000 mg | ORAL_TABLET | Freq: Three times a day (TID) | ORAL | Status: DC
  Administered 2011-11-28 – 2011-11-30 (×7): 15 mg via ORAL
  Filled 2011-11-28 (×8): qty 1

## 2011-11-28 MED ORDER — POTASSIUM CHLORIDE CRYS ER 20 MEQ PO TBCR
40.0000 meq | EXTENDED_RELEASE_TABLET | ORAL | Status: AC
  Administered 2011-11-28 (×2): 40 meq via ORAL
  Filled 2011-11-28 (×2): qty 2

## 2011-11-28 MED ORDER — DULOXETINE HCL 30 MG PO CPEP
30.0000 mg | ORAL_CAPSULE | Freq: Two times a day (BID) | ORAL | Status: DC
  Administered 2011-11-28 – 2011-11-30 (×4): 30 mg via ORAL
  Filled 2011-11-28 (×6): qty 1

## 2011-11-28 MED FILL — Perflutren Lipid Microsphere IV Susp 1.1 MG/ML: INTRAVENOUS | Qty: 10 | Status: AC

## 2011-11-28 NOTE — Progress Notes (Signed)
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 Presented to [facility] with one week history of malaise, fever, chills, productive cough and SOB. At admission to [facility] ED she was found to be hypoxic and hypotensive. Chest X ray showed bilateral infiltrates with very diffuse infiltrates on the entire LLL. Sepsis protocol was initiated, a right IJ triple lumen CVC was placed and the patient was transferred to [place]. At arrival to [facility] ICU she is alert, awake, with persistent hypotension and tachycardia. She is saturating 98% on 100% 6L [place] with a RR of 30/min. Apparently the patient was complaining of abdominal pain but now is resolved.  Current Status:  in chair Less distress daily  Vital Signs: Temp:  [97.6 F (36.4 C)-98.9 F (37.2 C)] 98.4 F (36.9 C) (08/28 0815) Pulse Rate:  [53-68] 61  (08/28 1000) Resp:  [14-26] 16  (08/28 1000) BP: (117-158)/(60-98) 133/76 mmHg (08/28 1000) SpO2:  [90 %-100 %] 100 % (08/28 1000) Weight:  [65.2 kg (143 lb 11.8 oz)] 65.2 kg (143 lb 11.8 oz) (08/28 5621)  Physical Examination: General:  No distres today in chair Neuro:  Awake, alert, oriented x 3 HEENT:  PERRL, pink conjunctivae, moist membranes Neck:  Supple, wnl JVD   Cardiovascular:  RRR, no M/R/G Lungs: rt coarse, better aeration Abdomen:  Soft, nontender, nondistended, bowel sounds present Musculoskeletal:  Moves all extremities, no pedal edema Skin:  No rash  ASSESSMENT AND PLAN  PULMONARY  Lab 11/26/11 1227 11/24/11 0355 11/24/11 0142 11/23/11 2030  PHART 7.380 -- 7.233* 7.409  PCO2ART 38.4 -- 50.2* 34.9*  PO2ART 89.0 -- 85.0 61.3*  HCO3 22.7 -- 21.3 21.6  O2SAT 97.0 72.2 94.0 91.5   Ventilator Settings:   CXR:  Left lung multilobar pneumonia. Improved aeration 8/28  A:  1) Severe multilobar  pneumonia 2) Hypercarbic and hypoxemic respiratory failure. (CO2 34.9 --> 50.2) P:   - O2 support -scheduled treatments -pcxr in am  -IS Lasix improved with this approach pcxr better aeration mobilize  CARDIOVASCULAR  Lab 11/24/11 0230 11/23/11 2051 11/23/11 1930  TROPONINI -- -- <0.30  LATICACIDVEN 2.4* 4.3* --  PROBNP -- -- --   ECG:  Sinus tachycardia. Lines: Right IJ CVC placed today (11/23/11) at [facility] ED  A:  3) Hyperdynamic heart with normal LV function on bedside echo. (personally done) bardycardia asymptomatic and CHRONIC per dad P:  -tele for int brady, chronic per dad , asympto Echo no per effusion  RENAL  Lab 11/28/11 0336 11/27/11 0650 11/26/11 0354 11/25/11 0420 11/24/11 1220  NA 139 145 140 140 139  K 2.8* 4.1 -- -- --  CL 100 108 109 108 109  CO2 30 21 23 25 21   BUN 17 20 24* 22 20  CREATININE 0.54 0.55 0.70 0.68 0.65  CALCIUM 8.3* 8.8 9.4 9.0 8.5  MG -- -- 1.9 2.2 --  PHOS -- -- 3.0 2.4 --   Intake/Output      08/27 0701 - 08/28 0700 08/28 0701 - 08/29 0700   P.O.     I.V. (mL/kg) 170 (2.6) 30 (0.5)   IV Piggyback 52 2   Total Intake(mL/kg) 222 (3.4) 32 (0.5)   Urine (mL/kg/hr) 1200 (0.8)    Total Output 1200    Net -978 +32  Urine Occurrence 6 x 2 x   Stool Occurrence 4 x 2 x     Intake/Output Summary (Last 24 hours) at 11/28/11 1231 Last data filed at 11/28/11 1000  Gross per 24 hour  Intake    212 ml  Output    300 ml  Net    -88 ml   Foley:  Placed today (11/23/11)  A:   1) Preserved kidney function 2) Hypokalemia addressed:   - KVo Lasix continue same dose Neg 1 lit goal, again same dose bmet in am  Replace K Assess mag, phos in am   GASTROINTESTINAL  Lab 11/23/11 1930  AST 78*  ALT 38*  ALKPHOS 41  BILITOT 0.8  PROT 6.6  ALBUMIN 2.5*   A:   1) Mildly elevated transaminases, likely secondary to sepsis. Legionella? P:   Reg diet  HEMATOLOGIC  Lab 11/27/11 1105 11/26/11 0354 11/25/11 0420 11/24/11  0230 11/23/11 1930  HGB 12.0 11.3* 10.3* 10.3* 12.9  HCT 35.5* 34.7* 31.2* 30.9* 37.6  PLT 114* 125* 123* 121* 172  INR -- -- -- -- --  APTT -- -- -- -- --   A:   1) Neutropenia, unclear etiology (sepsis?), hemoconcetrating P:  Sub qh hemoconcetration continue, lasix maintain  INFECTIOUS  Lab 11/27/11 1105 11/26/11 0354 11/25/11 0420 11/24/11 0230 11/23/11 1930  WBC 15.8* 15.5* 6.9 2.3* 1.2*  PROCALCITON -- -- -- -- 15.49   Cultures: Blood, urine and sputum cultures sent 11/23/11) Blood with GPC in pairs 2/2 Sputum NTD Urine NTD Antibiotics: Zosyn 8/23>>>off Vancomycin 8/23>>>off Azithromycin 8/23>>>off Ceftriaxone 8/26>>> plan stop 30th  A:   1) Severe community acquired multilobar pneumonia in the setting of neutropenia. 2) Septic shock P:   - to ceftriaxone to stop date, no changes  ENDOCRINE  Lab 11/28/11 0811 11/27/11 2341 11/27/11 1548 11/27/11 0742 11/27/11 0014  GLUCAP 137* 133* 110* 139* 118*   A:   1) No history of DM  P:   -reduce steroids to 20 mg, to pred in am    NEUROLOGIC  A:   1) History of head trauma, seizures and memory loss. 2) Awake, alert, oriented and non focal. P:   - Will continue anti seizure medications. Chair pt  To sdu awiated [facility] service, will notify , seen today  Mcarthur Rossetti. Tyson Alias, MD, FACP Pgr: [phone] [facility]

## 2011-11-28 NOTE — Progress Notes (Signed)
Clinical Child psychotherapist spoke with requested facilities in [place], [place].  They are unable to accept pt due to insurance-[place] Medicaid only.  CSW left voicemail message with pt's father to discuss additional bed choices.  CSW submitted for [facility].   Angelia Mould, MSW, [place] [phone]

## 2011-11-28 NOTE — Progress Notes (Signed)
Pt had to episodes of diarrhea and c/o abd cramping.  Now c/o nausea.  Dr. Molli Knock notified, order for zofran entered.

## 2011-11-28 NOTE — Progress Notes (Signed)
Physical Therapy Treatment Patient Details Name: Melanie [name] MRN: [id number] DOB: 1965-07-07 Today's Date: 11/28/2011 Time: 4098-1191 PT Time Calculation (min): 28 min  PT Assessment / Plan / Recommendation Comments on Treatment Session  pt presents with PNA and Resp Failure.  pt with history TBI and often needs gentle encouragement to attend to task and for participation.  pt with improved mobility today.      Follow Up Recommendations  Skilled nursing facility    Barriers to Discharge        Equipment Recommendations  None recommended by PT    Recommendations for Other Services OT consult  Frequency Min 3X/week   Plan Discharge plan remains appropriate;Frequency remains appropriate    Precautions / Restrictions Precautions Precautions: Fall Restrictions Weight Bearing Restrictions: No   Pertinent Vitals/Pain Denies pain, but indicates she doesn't feel well.  RN aware.      Mobility  Bed Mobility Bed Mobility: Not assessed Transfers Transfers: Sit to Stand;Stand to Sit Sit to Stand: 4: Min assist;With upper extremity assist;From chair/3-in-1;With armrests Stand to Sit: 4: Min guard;With upper extremity assist;With armrests;To chair/3-in-1 Details for Transfer Assistance: pt needs gentle cues for encouragement and safety Ambulation/Gait Ambulation/Gait Assistance: 4: Min guard Ambulation Distance (Feet): 25 Feet Assistive device:  (pushing W/C) Ambulation/Gait Assistance Details: pt moves well, but needs max encouragement for participation, slow deep breathing, and attending to task.  2nd person utilized for A with equip.   Gait Pattern: Step-through pattern;Decreased stride length Gait velocity: Very slow.   Stairs: No Wheelchair Mobility Wheelchair Mobility: No    Exercises     PT Diagnosis:    PT Problem List:   PT Treatment Interventions:     PT Goals Acute Rehab PT Goals Time For Goal Achievement: 12/10/11 PT Goal: Sit to Stand - Progress:  Progressing toward goal PT Goal: Stand to Sit - Progress: Progressing toward goal PT Goal: Ambulate - Progress: Progressing toward goal  Visit Information  Last PT Received On: 11/28/11 Assistance Needed: +2 (helpful for equip)    Subjective Data  Subjective: Everyone says I look better, but I don't know.     Cognition  Overall Cognitive Status: History of cognitive impairments - further impaired Area of Impairment: Attention;Memory;Safety/judgement;Awareness of deficits;Problem solving Arousal/Alertness: Awake/alert Orientation Level: Disoriented to;Time;Place;Situation Behavior During Session: [facility] for tasks performed Current Attention Level: Selective Memory Deficits: pt with decreased recall of home situation and presents situation.   Safety/Judgement: Decreased safety judgement for tasks assessed;Decreased awareness of need for assistance;Impulsive Cognition - Other Comments: pt with history of TBI and unclear prior level of cognition.  Per MD notes pt has baseline of some cognitive deficits.      Balance  Balance Balance Assessed: No  End of Session PT - End of Session Equipment Utilized During Treatment: Gait belt;Oxygen Activity Tolerance: Patient tolerated treatment well Patient left: in chair;with call bell/phone within reach [facility] present) Nurse Communication: Mobility status   GP     Rolondo Pierre, Alison Murray 11/28/2011, 12:13 PM

## 2011-11-28 NOTE — Progress Notes (Signed)
Pt transferred from 2100 via WC with sitter.  VSS on arrival.  Oriented to unit, needs reinforcement.

## 2011-11-28 NOTE — Progress Notes (Signed)
[facility] ADULT ICU REPLACEMENT PROTOCOL FOR AM LAB REPLACEMENT ONLY  The patient does apply for the [facility] Adult ICU Electrolyte Replacment Protocol based on the criteria listed below:   1. Is GFR >/= 50 ml/min? yes  Patient's GFR today is >90 2. Is urine output >/= 0.5 ml/kg/hr for the last 8 hours? yes Patient's UOP is 0.56 ml/kg/hr 3. Is BUN < 30 mg/dL? yes  Patient's BUN today is 17 4. Abnormal electrolyte(s): K 2.8  5. Ordered repletion with: per protocol  6. If a panic level lab has been reported, has the CCM MD in charge been notified? yes.   Physician:  Dr Pedro Earls, Jonathon Castelo A 11/28/2011 5:08 AM

## 2011-11-29 DIAGNOSIS — J13 Pneumonia due to Streptococcus pneumoniae: Secondary | ICD-10-CM | POA: Diagnosis present

## 2011-11-29 DIAGNOSIS — D696 Thrombocytopenia, unspecified: Secondary | ICD-10-CM | POA: Diagnosis not present

## 2011-11-29 LAB — BASIC METABOLIC PANEL WITH GFR
BUN: 16 mg/dL (ref 6–23)
CO2: 34 meq/L — ABNORMAL HIGH (ref 19–32)
Calcium: 8.7 mg/dL (ref 8.4–10.5)
Chloride: 98 meq/L (ref 96–112)
Creatinine, Ser: 0.57 mg/dL (ref 0.50–1.10)
GFR calc Af Amer: >90 mL/min
GFR calc non Af Amer: >90 mL/min
Glucose, Bld: 143 mg/dL — ABNORMAL HIGH (ref 70–99)
Potassium: 3.7 meq/L (ref 3.5–5.1)
Sodium: 140 meq/L (ref 135–145)

## 2011-11-29 LAB — MAGNESIUM: Magnesium: 1.8 mg/dL (ref 1.5–2.5)

## 2011-11-29 LAB — PHOSPHORUS: Phosphorus: 4 mg/dL (ref 2.3–4.6)

## 2011-11-29 MED ORDER — POTASSIUM CHLORIDE CRYS ER 20 MEQ PO TBCR: 40.0000 meq | EXTENDED_RELEASE_TABLET | ORAL | Status: DC

## 2011-11-29 MED ORDER — LEVOFLOXACIN 500 MG PO TABS
500.0000 mg | ORAL_TABLET | Freq: Every day | ORAL | Status: DC
  Administered 2011-11-29 – 2011-11-30 (×2): 500 mg via ORAL
  Filled 2011-11-29 (×2): qty 1

## 2011-11-29 MED ORDER — PREDNISONE 20 MG PO TABS
30.0000 mg | ORAL_TABLET | Freq: Every day | ORAL | Status: DC
  Filled 2011-11-29: qty 1

## 2011-11-29 NOTE — Progress Notes (Signed)
Clinical Social Worker met with pt at bedside.  CSW reviewed only bed option at this point-[place].  Pt is adamaent that she does not wish to accept this bed offer.  Pt agreeable to "seeing if anything is open" in [facility].  CSW faxed pt out to [facility].  CSW to continue to follow and assist as needed. Pt did express that she is favoring going home with home health.  RNCM updated.  Angelia Mould, MSW, [place] [phone]

## 2011-11-29 NOTE — Progress Notes (Signed)
Order to transfer pt. Pt notified of plan to transfer.  Belongings packed.  Report called to [facility] RN [id number].  Pt to be transferred via WC to [phone].

## 2011-11-29 NOTE — Progress Notes (Signed)
Clinical Social Worker met with pt and provided bed offers.  Pt stated that since bed offers are not close to pt's home she would prefer to go home with home health.  CSW to notify RNCM.      Angelia Mould, MSW, [place] [phone]

## 2011-11-29 NOTE — Progress Notes (Signed)
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 Admitted 8/24 with severe sepsis due to pneumococcal PNA Current Status:  No distress. No new complaints  Vital Signs: Temp:  [98 F (36.7 C)-98.8 F (37.1 C)] 98.3 F (36.8 C) (08/29 0800) Pulse Rate:  [57-91] 63  (08/29 0800) Resp:  [14-23] 15  (08/29 0800) BP: (106-152)/(52-97) 130/80 mmHg (08/29 0800) SpO2:  [93 %-100 %] 100 % (08/29 0800) Weight:  [142 lb 10.2 oz (64.7 kg)] 142 lb 10.2 oz (64.7 kg) (08/28 1645)  Physical Examination: General:  Pleasant female, NAD  Neuro:  Awake, alert, oriented x 3 HEENT:  PERRL, pink conjunctivae, moist membranes Neck:  Supple, wnl JVD   Cardiovascular:  RRR, no M/R/G Lungs: resps even non labored, L>R rhonchi  Abdomen:  Soft, nontender, nondistended, bowel sounds present Musculoskeletal:  Moves all extremities, trace BLE edema  Skin:  No rash  ASSESSMENT AND PLAN  PULMONARY  Lab 11/26/11 1227 11/24/11 0355 11/24/11 0142 11/23/11 2030  PHART 7.380 -- 7.233* 7.409  PCO2ART 38.4 -- 50.2* 34.9*  PO2ART 89.0 -- 85.0 61.3*  HCO3 22.7 -- 21.3 21.6  O2SAT 97.0 72.2 94.0 91.5   CXR:  NSC L>R infiltrates  A:  1) Severe multilobar pneumococcal pneumonia 2) Hypercarbic and hypoxemic respiratory failure, resolved P:   - O2 support - wean as able  -cont BD treatments -2v CXR AM -mobilize -D/C further Lasix    RENAL  Lab 11/29/11 0500 11/28/11 0336 11/27/11 0650 11/26/11 0354 11/25/11 0420  NA 140 139 145 140 140  K 3.7 2.8* -- -- --  CL 98 100 108 109 108  CO2 34* 30 21 23 25   BUN 16 17 20  24* 22  CREATININE 0.57 0.54 0.55 0.70 0.68  CALCIUM 8.7 8.3* 8.8 9.4 9.0  MG 1.8 -- -- 1.9 2.2  PHOS 4.0 -- -- 3.0 2.4    Intake/Output Summary (Last 24 hours) at 11/29/11 0908 Last data filed at 11/29/11  0855  Gross per 24 hour  Intake    672 ml  Output    551 ml  Net    121 ml    A:   1) Preserved kidney function 2) Hypokalemia, resolved - KVo fluids  -Lasix continue same dose through today, re-eval 8/30 -goal net neg  -bmet in am  -Replace K PRN   GASTROINTESTINAL 1) Diarrhea on colace. Improved. Doubt C diff P:   D/C order for C diff D/C Colace  HEMATOLOGIC  Lab 11/27/11 1105 11/26/11 0354 11/25/11 0420 11/24/11 0230 11/23/11 1930  HGB 12.0 11.3* 10.3* 10.3* 12.9  HCT 35.5* 34.7* 31.2* 30.9* 37.6  PLT 114* 125* 123* 121* 172  INR -- -- -- -- --  APTT -- -- -- -- --   A:   1) Neutropenia, resolved 2) thrombocytopenia, mild D/C SQ heparin, ambulating Monitor cbc  INFECTIOUS  Lab 11/27/11 1105 11/26/11 0354 11/25/11 0420 11/24/11 0230 11/23/11 1930  WBC 15.8* 15.5* 6.9 2.3* 1.2*  PROCALCITON -- -- -- -- 15.49   Cultures: Blood cultures 8/23 >> strep pneumoniae Sputum>>> NTD Urine >>>neg Urine strep Ag >> positive  Antibiotics: Zosyn 8/23>>>off Vancomycin 8/23>>>off Azithromycin 8/23>>>off Ceftriaxone 8/26>>> 8/29 Levofloxacin (PO) 8/29 >> 9/3 (stop date ordered)  A:   1) Pneumococcal PNA 2) Septic shock, resolved P:   -  abx as above  ENDOCRINE  Lab 11/28/11 1209 11/28/11 0811 11/27/11 2341 11/27/11 1548 11/27/11 0742  GLUCAP 160* 137* 133* 110* 139*   A:   1) No history of DM  P:   -D/C Prednisone   NEUROLOGIC  A:   1) History of head trauma, seizures and memory loss. 2) Resolving delirium P:   - Will continue anti seizure medications. -mobilize  -pt/ot   Transfer to med-surg bed. Anticipate D/C home in next day or two   WHITEHEART,KATHRYN, NP 11/29/2011  9:11 AM Pager: [phone] or [phone]) [phone]  *Care during the described time interval was provided by me and/or other providers on the critical care team. I have reviewed this patient's available data, including medical history, events of note, physical examination and  test results as part of my evaluation.    Billy Fischer, MD ; [facility] [place] [phone].  After 5:30 PM or weekends, call [phone]

## 2011-11-30 ENCOUNTER — Inpatient Hospital Stay (HOSPITAL_COMMUNITY): Payer: Medicaid Other

## 2011-11-30 LAB — CBC
Hemoglobin: 11 g/dL — ABNORMAL LOW (ref 12.0–15.0)
MCH: 32 pg (ref 26.0–34.0)
MCHC: 33.3 g/dL (ref 30.0–36.0)
Platelets: 105 10*3/uL — ABNORMAL LOW (ref 150–400)

## 2011-11-30 MED ORDER — LEVOFLOXACIN 500 MG PO TABS: 500.0000 mg | ORAL_TABLET | Freq: Every day | ORAL | Status: AC

## 2011-11-30 NOTE — Progress Notes (Signed)
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 Admitted 8/24 with severe sepsis due to pneumococcal PNA  Cultures: Blood cultures 8/23 >> strep pneumoniae Sputum>>> NTD Urine >>>neg Urine strep Ag >> positive  Antibiotics: Zosyn 8/23>>>off Vancomycin 8/23>>>off Azithromycin 8/23>>>off Ceftriaxone 8/26>>> 8/29 Levofloxacin (PO) 8/29 >> 9/3 (stop date ordered)  Current Status:  No distress. No new complaints. Still feels weak  Vital Signs: Temp:  [97.4 F (36.3 C)-98.4 F (36.9 C)] 98.4 F (36.9 C) (08/30 0925) Pulse Rate:  [67-99] 96  (08/30 0925) Resp:  [13-18] 18  (08/30 0925) BP: (107-134)/(69-79) 115/76 mmHg (08/30 0925) SpO2:  [95 %-100 %] 97 % (08/30 0925) Weight:  [64.7 kg (142 lb 10.2 oz)] 64.7 kg (142 lb 10.2 oz) (08/29 2058)  Physical Examination: General:  Pleasant female, NAD  Neuro:  Awake, alert, oriented x 3 HEENT:  PERRL, pink conjunctivae, moist membranes Neck:  Supple, wnl JVD   Cardiovascular:  RRR, no M/R/G Lungs: resps even non labored, L>R rales  Abdomen:  Soft, nontender, nondistended, bowel sounds present Musculoskeletal:  Moves all extremities, trace BLE edema  Skin:  No rash   Lab 11/29/11 0500 11/28/11 0336 11/27/11 0650  NA 140 139 145  K 3.7 2.8* 4.1  CL 98 100 108  CO2 34* 30 21  BUN 16 17 20   CREATININE 0.57 0.54 0.55  GLUCOSE 143* 138* 140*    Lab 11/30/11 0555 11/27/11 1105 11/26/11 0354  HGB 11.0* 12.0 11.3*  HCT 33.0* 35.5* 34.7*  WBC 9.6 15.8* 15.5*  PLT 105* 114* 125*     CXR:  NSC L>R infiltrates, very small left effusion   ASSESSMENT AND PLAN  1) Severe multilobar pneumococcal pneumonia 2) Hypercarbic and hypoxemic respiratory failure, resolved P:   - O2 support - wean as able  -cont BD treatments -mobilize -f/u cxr 2 weeks our  office  3) Pneumococcal sepsis - complete 10-14 ds of antibiotics total - FU CXR in 1 wk for resoluton of infiltrates Diarrhea on colace. Improved. Doubt C diff   4) Neutropenia, resolved  Lab 11/30/11 0555 11/27/11 1105 11/26/11 0354  WBC 9.6 15.8* 15.5*    5) thrombocytopenia, mild  Lab 11/30/11 0555 11/27/11 1105 11/26/11 0354  PLT 105* 114* 125*   D/C'd SQ heparin, ambulating Monitor cbc  6) History of head trauma, seizures and memory loss 7)  Resolving delirium P:   SNF   Dispo:  Pt desaturated w/out oxygen to mid 80s. Still needs rolling walker. Has baseline memory problems. She is agreeable to short-term SNF.  BABCOCK,PETE   Independently examined pt, evaluated data & formulated above care plan with NP, editeed note   ALVA,RAKESH V.

## 2011-11-30 NOTE — Discharge Summary (Signed)
Physician Discharge Summary     Patient ID: Melanie [name] MRN: [id number] DOB/AGE: 48-Apr-1967 48 y.o.  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 Admitted 8/24 with severe sepsis due to pneumococcal PNA  Cultures:  Blood cultures 8/23 >> strep pneumoniae  Sputum>>> NTD  Urine >>>neg  Urine strep Ag >> positive    Antibiotics:  Zosyn 8/23>>>off  Vancomycin 8/23>>>off  Azithromycin 8/23>>>off  Ceftriaxone 8/26>>> 8/29  Levofloxacin (PO) 8/29 >> 9/3 (stop date ordered)  Hospital Course:  1) Severe multilobar pneumococcal pneumonia (pneumococcal)  2) Hypercarbic and hypoxemic respiratory failure, resolved  Admitted to the intensive care after initial presentation on 7/31. Was treated with supplemental oxygen, empiric antibiotics and pulmonary hygiene measures. She did not require NIPPV or intubation. Her pulmonary status has slowly improved. At time of discharge she still has exertional hypoxia on room air, requiring supplemental oxygen as her sats drop to low to mid 80% range with out oxygen support. She is medically cleared to D/C to SNF to continue supportive care and rehabilitation services.  Plan: - O2 support - wean as able (will go home on oxygen-->we can assess further oxygen need on her f/u appointment w/us -cont BD treatments  -mobilize  -f/u cxr and office visit w/ [place] on 9/16 at 1 pm   3) resolved septic shock, due to pneumococcal Pneumonia This was treated in usual fashion. Therapeutic interventions have included: central access for CVP monitoring, aggressive IVF administration, vaso-active gtt  support. She slowly improved from a hemodynamic stand-point. We were able to wean her off pressors and she was again hemodynamically stabilized.   4) Diarrhea on colace. Improved. Doubt C diff This resolved after stopping colase.   5) neutropenia in setting of sepsis.  Treated w/ supportive care by treating active infection. This is resolved.   Lab 11/30/11 0555 11/27/11 1105 11/26/11 0354  WBC 9.6 15.8* 15.5*   6) thrombocytopenia, mild Stable. Due to sepsis. Heparin was stopped. PAS hose placed. No transfusion required.   Lab 11/30/11 0555 11/27/11 1105 11/26/11 0354  PLT 105* 114* 125*   7)History of head trauma, seizures and memory loss this was complicated by acute encephalopathy/delirium This was treated w/ supportive measures while in the hospital. She is now ready for discharge as she is clinically back to baseline. Given her short term memory deficits it would be best for her to continue her recovery in the SNF setting given risk for hypoxia if she is off oxygen.   8) physical deconditioning This is after prolonged critical illness. She was seen by physical therapy who noted her weakness and recommended Skilled nursing placement for PT and OT service.    Discharge Exam: BP 115/76  Pulse 96  Temp 98.4 F (36.9 C) (Oral)  Resp 18  Ht 4\' 11"  (1.499 m)  Wt 142 lb 10.2 oz (64.7 kg)  BMI 28.81 kg/m2  SpO2 97%  LMP 11/01/2011 2 liters   Physical Examination:  General: Pleasant female, NAD  Neuro: Awake, alert, oriented x 3  HEENT: PERRL, pink conjunctivae, moist membranes  Neck: Supple, wnl JVD  Cardiovascular: RRR, no M/R/G  Lungs: resps even non labored, L>R rales  Abdomen: Soft, nontender,  nondistended, bowel sounds present  Musculoskeletal: Moves all extremities, trace BLE edema  Skin: No rash  Labs at discharge Lab Results  Component Value Date   CREATININE 0.57 11/29/2011   BUN 16 11/29/2011   NA 140 11/29/2011   K 3.7 11/29/2011   CL 98 11/29/2011   CO2 34*  11/29/2011   Lab Results  Component Value Date   WBC 9.6 11/30/2011   HGB 11.0* 11/30/2011   HCT 33.0* 11/30/2011   MCV 95.9 11/30/2011   PLT 105* 11/30/2011   Lab Results  Component Value Date   ALT 38* 11/23/2011   AST 78* 11/23/2011   ALKPHOS 41 11/23/2011   BILITOT 0.8 11/23/2011   Lab Results  Component Value Date   INR 0.99 04/02/2009   INR 0.9 09/22/2008    Current radiology studies Dg Chest 2 View  11/30/2011  *RADIOLOGY REPORT*  Clinical Data: Pneumonia.  Cough.  CHEST - 2 VIEW  Comparison: 11/28/2011  Findings: Stable left upper and lower lobe airspace opacities favor pneumonia.  Indistinct densities at the right lung base also may reflect pneumonia.  Cardiac and mediastinal contours appear unremarkable.  Blunted left costophrenic angles suggest small pleural effusion.  IMPRESSION:  1.  Stable radiographic appearance the chest, with left greater than right airspace opacities favoring pneumonia, and small left pleural effusion.   Original Report Authenticated By: Dellia Cloud, M.D.     Disposition:  SNF  Discharge Orders    Future Appointments: Provider: Department: Dept Phone: Center:   12/17/2011 1:30 PM Oretha Milch, MD [facility] [phone] None     Future Orders Please Complete By Expires   Discharge patient      Comments:   To SNF     Medication List  As of 11/30/2011  1:53 PM   TAKE these medications         acetaminophen-codeine 300-60 MG per tablet   Commonly known as: TYLENOL #4   Take 1 tablet by mouth 3 (three) times daily as needed.      busPIRone 15 MG tablet   Commonly known as: BUSPAR   Take 15 mg by mouth 3 (three) times daily.      DULoxetine 30 MG capsule   Commonly known as: CYMBALTA   Take 30 mg by mouth 2 (two) times daily.      levETIRAcetam 500 MG tablet   Commonly known as: KEPPRA   Take 500 mg by mouth 2 (two) times daily.      levofloxacin 500 MG tablet   Commonly known as: LEVAQUIN   Take 1 tablet (500 mg total)  by mouth daily.      nortriptyline 10 MG capsule   Commonly known as: PAMELOR   Take 10 mg by mouth at bedtime.      traZODone 100 MG tablet   Commonly known as: DESYREL   Take 100 mg by mouth at bedtime.      VENTOLIN HFA 108 (90 BASE) MCG/ACT inhaler   Generic drug: albuterol   Inhale 2 puffs into the lungs every 6 (six) hours as needed.           Follow-up Information    Follow up with Oretha Milch., MD on 12/17/2011. (at 1 pm for cxr, and 130 w/ dr Vassie Loll)    Contact information:   [facility] [address] [place] [address] [phone]          Discharged Condition: fair , to SNF - she  will need to be on oxygen until reassessed at SNF in a few days - expect this to improve  Physician Statement:   The Patient was personally examined, the discharge assessment and plan has been personally reviewed and I agree with ACNP Babcock's assessment and plan. > 30 minutes of time have been dedicated to discharge assessment, planning and discharge instructions.   Signed: Margel Joens V. 11/30/2011, 1:53 PM

## 2011-11-30 NOTE — Progress Notes (Signed)
Physical Therapy Treatment Patient Details Name: Melanie [name] MRN: [id number] DOB: Aug 07, 1965 Today's Date: 11/30/2011 Time: 0272-5366 PT Time Calculation (min): 16 min  PT Assessment / Plan / Recommendation Comments on Treatment Session  Attempted walk without use of O2   but she did drop sats into low 80s.  It took pt. several minutes to stabilize her sats once O2 reapplied.      Follow Up Recommendations  Skilled nursing facility    Barriers to Discharge        Equipment Recommendations  None recommended by PT    Recommendations for Other Services    Frequency Min 3X/week   Plan Discharge plan remains appropriate;Frequency remains appropriate    Precautions / Restrictions Precautions Precautions: Fall Restrictions Weight Bearing Restrictions: No   Pertinent Vitals/Pain No pain, ambulated without O2 and sats decreased to 81 on RA.  Sats came back up to mid 90's once O2 reapplied.    Mobility  Bed Mobility Bed Mobility: Supine to Sit;Sit to Supine Supine to Sit: 5: Supervision;With rails;HOB flat Sit to Supine: 5: Supervision;HOB flat Details for Bed Mobility Assistance: supervision for safety Transfers Transfers: Sit to Stand;Stand to Sit Sit to Stand: 4: Min guard;From bed;With upper extremity assist Stand to Sit: 4: Min guard;To bed;With upper extremity assist Details for Transfer Assistance: minimal safety cueing needed today Ambulation/Gait Ambulation/Gait Assistance: 4: Min guard Ambulation Distance (Feet): 60 Feet Assistive device: Rolling walker Ambulation/Gait Assistance Details: Pt. self limited her distance today due to being sleepy and wanting to go back to bed.  Generally safe to ambulate with RW with one assist Gait Pattern: Step-through pattern;Decreased stride length Gait velocity: slow Stairs: No    Exercises     PT Diagnosis:    PT Problem List:   PT Treatment Interventions:     PT Goals Acute Rehab PT Goals PT Goal: Supine/Side to Sit  - Progress: Progressing toward goal PT Goal: Sit to Supine/Side - Progress: Progressing toward goal PT Goal: Sit to Stand - Progress: Progressing toward goal PT Goal: Stand to Sit - Progress: Progressing toward goal PT Goal: Ambulate - Progress: Progressing toward goal  Visit Information  Last PT Received On: 11/30/11 Assistance Needed: +1    Subjective Data  Subjective: "I did not sleep much last night.  I want to go back to bed after we walk"   Cognition  Overall Cognitive Status: History of cognitive impairments - further impaired Area of Impairment: Attention;Memory;Safety/judgement;Awareness of deficits;Problem solving Arousal/Alertness: Awake/alert Orientation Level: Appears intact for tasks assessed Behavior During Session: [facility] for tasks performed Current Attention Level: Selective;Other (comment) (gentle cues to stay on task) Cognition - Other Comments: baseline cognitive deficits, memory due to TBI    Balance     End of Session PT - End of Session Equipment Utilized During Treatment: Gait belt Activity Tolerance: Patient tolerated treatment well Patient left: in bed;with call bell/phone within reach Nurse Communication: Mobility status   GP     Ferman Hamming 11/30/2011, 10:08 AM Weldon Picking PT Acute Rehab Services [phone] [place] [phone]

## 2011-12-01 LAB — CULTURE, BLOOD (ROUTINE X 2)

## 2011-12-17 ENCOUNTER — Inpatient Hospital Stay: Payer: Medicaid Other | Admitting: Pulmonary Disease

## 2012-01-02 ENCOUNTER — Encounter (HOSPITAL_COMMUNITY): Payer: Self-pay | Admitting: Emergency Medicine

## 2012-01-02 ENCOUNTER — Emergency Department (HOSPITAL_COMMUNITY): Payer: Medicaid Other

## 2012-01-02 ENCOUNTER — Emergency Department (HOSPITAL_COMMUNITY)
Admission: EM | Admit: 2012-01-02 | Discharge: 2012-01-02 | Disposition: A | Discharge status: Home / Self Care | Payer: Medicaid Other | Attending: Emergency Medicine | Admitting: Emergency Medicine

## 2012-01-02 DIAGNOSIS — F1910 Other psychoactive substance abuse, uncomplicated: Secondary | ICD-10-CM | POA: Insufficient documentation

## 2012-01-02 DIAGNOSIS — R51 Headache: Secondary | ICD-10-CM

## 2012-01-02 DIAGNOSIS — R4182 Altered mental status, unspecified: Secondary | ICD-10-CM | POA: Insufficient documentation

## 2012-01-02 DIAGNOSIS — R110 Nausea: Secondary | ICD-10-CM | POA: Insufficient documentation

## 2012-01-02 LAB — CBC WITH DIFFERENTIAL/PLATELET
Basophils Absolute: 0 10*3/uL (ref 0.0–0.1)
Eosinophils Relative: 1 % (ref 0–5)
Lymphocytes Relative: 17 % (ref 12–46)
Lymphs Abs: 1.4 10*3/uL (ref 0.7–4.0)
MCV: 93.8 fL (ref 78.0–100.0)
Neutro Abs: 6.6 10*3/uL (ref 1.7–7.7)
Neutrophils Relative %: 79 % — ABNORMAL HIGH (ref 43–77)
Platelets: 323 10*3/uL (ref 150–400)
RBC: 4.03 MIL/uL (ref 3.87–5.11)
RDW: 13.9 % (ref 11.5–15.5)
WBC: 8.4 10*3/uL (ref 4.0–10.5)

## 2012-01-02 LAB — URINALYSIS, ROUTINE W REFLEX MICROSCOPIC
Glucose, UA: NEGATIVE mg/dL
Ketones, ur: 15 mg/dL — AB
Leukocytes, UA: NEGATIVE
Nitrite: NEGATIVE
Specific Gravity, Urine: 1.02 (ref 1.005–1.030)
pH: 8 (ref 5.0–8.0)

## 2012-01-02 LAB — BASIC METABOLIC PANEL
CO2: 27 mEq/L (ref 19–32)
Chloride: 105 mEq/L (ref 96–112)
Creatinine, Ser: 0.66 mg/dL (ref 0.50–1.10)
GFR calc Af Amer: >90 mL/min (ref >90)
Sodium: 142 mEq/L (ref 135–145)

## 2012-01-02 LAB — RAPID URINE DRUG SCREEN, HOSP PERFORMED
Amphetamines: NOT DETECTED
Barbiturates: NOT DETECTED
Cocaine: POSITIVE — AB
Opiates: POSITIVE — AB
Tetrahydrocannabinol: NOT DETECTED

## 2012-01-02 MED ORDER — SODIUM CHLORIDE 0.9 % IV BOLUS (SEPSIS)
250.0000 mL | Freq: Once | INTRAVENOUS | Status: AC
  Administered 2012-01-02: 18:00:00 via INTRAVENOUS

## 2012-01-02 MED ORDER — SODIUM CHLORIDE 0.9 % IV SOLN: INTRAVENOUS | Status: DC

## 2012-01-02 NOTE — ED Notes (Addendum)
Per EMS pt has a hx of a head injury (date unknown). EMS reports that she "snorts her px meds up her nose." White residue noted upon assessment. Pt complains of a headache and chest pain. Pt reports that this headache feels like the "ones I usually have." Pt admits to snorting her px meds.

## 2012-01-02 NOTE — ED Provider Notes (Signed)
History  This chart was scribed for Melanie Jakes, MD by Bennett Scrape. This patient was seen in room [facility] and the patient's care was started at 3:50PM.  CSN: [id number]  Arrival date & time 01/02/12  1542   First MD Initiated Contact with Patient 01/02/12 1550      Chief Complaint  Patient presents with  . Headache    Patient is a 46 y.o. female presenting with headaches. The history is provided by the patient. No language interpreter was used.  Headache  This is a chronic problem. The current episode started more than 2 days ago. The problem occurs constantly. The problem has been gradually worsening. The pain is located in the frontal region. Associated symptoms include nausea. Pertinent negatives include no fever, no shortness of breath and no vomiting.    Melanie [name] is a 46 y.o. female brought in by ambulance (son called), who presents to the Emergency Department complaining of gradual onset, gradually worsening, constant frontal HA that started one week ago with associated nausea. She states that the pain is non-radiating and rates the pain a 10 out of 10 currently. She reports that she gets similar less severe HAs often that started one year ago. She has a h/o head injury but denies head injury or accident around that time. She denies having a diagnosis of migraines. She also c/o lower right chest area/epigastric area pain that she reports is chronic and denies recent changes. She denies fever, neck pain, sore throat, visual disturbance, cough, SOB, emesis, diarrhea, urinary symptoms, back pain, weakness, numbness and rash as associated symptoms. She has a h/o thyroid disease, GERD, asthma and seizure. She is a current everyday smoker but denies alcohol use.  In triage, pt had a white substance around her nose. Upon questioning by nurse, pt admitted that she snorts her prescribed medications up her nose.  Dr. Jorene Guest is PCP.  Past Medical History  Diagnosis Date   . Thyroid disease   . GERD (gastroesophageal reflux disease)   . Seizure   . Asthma   . Head injury, acute   . Short-term memory loss   . Risk for falls     Past Surgical History  Procedure Date  . Brain surgery     History reviewed. No pertinent family history.  History  Substance Use Topics  . Smoking status: Current Every Day Smoker -- 1.0 packs/day for 35 years    Types: Cigarettes  . Smokeless tobacco: Not on file  . Alcohol Use: No    No OB history provided.  Review of Systems  Constitutional: Negative for fever.  HENT: Negative for sore throat and neck pain.   Eyes: Negative for visual disturbance.  Respiratory: Negative for cough and shortness of breath.   Cardiovascular: Positive for chest pain (chronic lower CP).  Gastrointestinal: Positive for nausea and abdominal pain (Chronic epigastric pain). Negative for vomiting and diarrhea.  Genitourinary: Negative for dysuria and frequency.  Musculoskeletal: Negative for back pain.  Skin: Negative for rash.  Neurological: Positive for headaches.    Allergies  Review of patient's allergies indicates no known allergies.  Home Medications   Current Outpatient Rx  Name Route Sig Dispense Refill  . ACETAMINOPHEN-CODEINE #4 300-60 MG PO TABS Oral Take 1 tablet by mouth 3 (three) times daily as needed. For pain    . BUSPIRONE HCL 15 MG PO TABS Oral Take 15 mg by mouth 3 (three) times daily.    . DULOXETINE HCL 30 MG PO  CPEP Oral Take 30 mg by mouth 2 (two) times daily.    Marland Kitchen LEVETIRACETAM 500 MG PO TABS Oral Take 500 mg by mouth 2 (two) times daily.    Marland Kitchen NORTRIPTYLINE HCL 10 MG PO CAPS Oral Take 10 mg by mouth at bedtime.    Marland Kitchen QUETIAPINE FUMARATE 50 MG PO TABS Oral Take 50 mg by mouth at bedtime.    . TRAZODONE HCL 100 MG PO TABS Oral Take 100 mg by mouth at bedtime.    . ALBUTEROL SULFATE HFA 108 (90 BASE) MCG/ACT IN AERS Inhalation Inhale 2 puffs into the lungs every 6 (six) hours as needed.      Triage Vitals:  BP 148/90  Pulse 94  Temp 98.1 F (36.7 C) (Oral)  Resp 18  Ht 4\' 11"  (1.499 m)  Wt 130 lb (58.968 kg)  BMI 26.26 kg/m2  SpO2 96%  LMP 12/26/2011  Physical Exam  Nursing note and vitals reviewed. Constitutional: She is oriented to person, place, and time. She appears well-developed and well-nourished. No distress.  HENT:  Head: Normocephalic and atraumatic.  Mouth/Throat: Oropharynx is clear and moist.  Eyes: Conjunctivae normal and EOM are normal. Pupils are equal, round, and reactive to light. No scleral icterus.       Pupils 3 mm in size and equal  Neck: Normal range of motion. Neck supple. No tracheal deviation present.  Cardiovascular: Normal rate and regular rhythm.   No murmur heard. Pulmonary/Chest: Effort normal and breath sounds normal. No respiratory distress.  Abdominal: Soft. Bowel sounds are normal. There is no tenderness.  Musculoskeletal: Normal range of motion. She exhibits no edema (no lower leg swelling).  Neurological: She is alert and oriented to person, place, and time. No cranial nerve deficit.       Appears mildly confused but is oriented  x3  Skin: Skin is warm and dry.  Psychiatric: She has a normal mood and affect. Her behavior is normal.    ED Course  Procedures (including critical care time)  DIAGNOSTIC STUDIES: Oxygen Saturation is 96% on room air, adequate by my interpretation.    COORDINATION OF CARE: 4:02PM-Discussed treatment plan which includes a CT of the head and IV fluids with pt at bedside and pt agreed to plan.  4:10-4:15PM-Ordered 250 mL of bolus, a CT scan of the head, CBC, UA and drug screen.  7:26PM-Pt rechecked and states that her HA is still present but less severe. She appears less confused and states that she wants to go home. Discussed discharge plan with pt at bedside and pt agreed to plan.  Labs Reviewed  CBC WITH DIFFERENTIAL - Abnormal; Notable for the following:    Neutrophils Relative 79 (*)     All other components  within normal limits  URINALYSIS, ROUTINE W REFLEX MICROSCOPIC - Abnormal; Notable for the following:    Ketones, ur 15 (*)     All other components within normal limits  URINE RAPID DRUG SCREEN (HOSP PERFORMED) - Abnormal; Notable for the following:    Opiates POSITIVE (*)     Cocaine POSITIVE (*)     All other components within normal limits  BASIC METABOLIC PANEL  TROPONIN I   Ct Head Wo Contrast  01/02/2012  *RADIOLOGY REPORT*  Clinical Data: Frontal headache for 1 week, nausea, seizures  CT HEAD WITHOUT CONTRAST  Technique:  Contiguous axial images were obtained from the base of the skull through the vertex without contrast.  Comparison: CT brain scan of the 11/23/2008  Findings: The ventricular system is stable in size and configuration and the septum remains midline position.  No extra- axial fluid collection is seen.  No hemorrhage, mass lesion, or acute infarction is noted.  Evidence of prior left temporal parietal craniotomy is noted.  IMPRESSION: No acute intracranial abnormality.   Original Report Authenticated By: Juline Patch, M.D.    Dg Chest Port 1 View  01/02/2012  *RADIOLOGY REPORT*  Clinical Data: Headache  PORTABLE CHEST - 1 VIEW  Comparison: Chest x-ray of 11/30/2011, 11/23/2011, and 04/07/2009  Findings: There is a persistent parenchymal opacity at the left lung base overlying the heart shadow which on the lateral view from 11/30/2011 may be posteriorly positioned.  This is worrisome for mass in view of the persistence of this opacity although this could represent persistent pneumonia.  The right lung is clear.  No effusion is seen.  Heart size is stable.  IMPRESSION: Persistent opacity in the left lower lobe possibly representing pneumonia.  However neoplasm cannot be excluded.  Consider CT of the chest with IV contrast to assess further.   Original Report Authenticated By: Juline Patch, M.D.      Date: 01/02/2012  Rate: 99  Rhythm: normal sinus rhythm  QRS Axis:  normal  Intervals: normal  ST/T Wave abnormalities: nonspecific ST/T changes  Conduction Disutrbances:none  Narrative Interpretation:   Old EKG Reviewed: unchanged EKG is unchanged since 11/26/2011   1. Altered mental status   2. Headache   3. Substance abuse       MDM  Patient clinically is improved. Back to baseline mental status. Workup in the emergency department negative except for the finding of substance abuse positive cocaine and opiates in the system. Patient headache persists but the head CT was negative for any acute findings. Patient has a history of headaches chronic.  I personally performed the services described in this documentation, which was scribed in my presence. The recorded information has been reviewed and considered.        Melanie Jakes, MD 01/02/12 1950

## 2012-01-02 NOTE — ED Notes (Signed)
Pt son and father advised that pt got drunk last night and snorted pills. States hx of cocaine use but family denies any that they know of at this time. Family denies pt not ever knowing her dob/date. Son stated "she is just playing you". EDP aware

## 2012-06-05 ENCOUNTER — Other Ambulatory Visit: Payer: Self-pay

## 2012-06-05 DIAGNOSIS — G4700 Insomnia, unspecified: Secondary | ICD-10-CM

## 2016-03-13 ENCOUNTER — Emergency Department (HOSPITAL_COMMUNITY)
Admission: EM | Admit: 2016-03-13 | Discharge: 2016-03-13 | Disposition: A | Discharge status: Home / Self Care | Payer: Medicaid Other | Attending: Emergency Medicine | Admitting: Emergency Medicine

## 2016-03-13 ENCOUNTER — Encounter (HOSPITAL_COMMUNITY): Payer: Self-pay | Admitting: Emergency Medicine

## 2016-03-13 DIAGNOSIS — Z79899 Other long term (current) drug therapy: Secondary | ICD-10-CM | POA: Insufficient documentation

## 2016-03-13 DIAGNOSIS — H6091 Unspecified otitis externa, right ear: Secondary | ICD-10-CM | POA: Diagnosis not present

## 2016-03-13 DIAGNOSIS — H6691 Otitis media, unspecified, right ear: Secondary | ICD-10-CM | POA: Diagnosis not present

## 2016-03-13 DIAGNOSIS — F17210 Nicotine dependence, cigarettes, uncomplicated: Secondary | ICD-10-CM | POA: Insufficient documentation

## 2016-03-13 DIAGNOSIS — H9201 Otalgia, right ear: Secondary | ICD-10-CM | POA: Diagnosis present

## 2016-03-13 DIAGNOSIS — J45909 Unspecified asthma, uncomplicated: Secondary | ICD-10-CM | POA: Diagnosis not present

## 2016-03-13 MED ORDER — AMOXICILLIN-POT CLAVULANATE 875-125 MG PO TABS
1.0000 | ORAL_TABLET | Freq: Once | ORAL | Status: AC
  Administered 2016-03-13: 1 via ORAL
  Filled 2016-03-13: qty 1

## 2016-03-13 MED ORDER — HYDROCODONE-ACETAMINOPHEN 5-325 MG PO TABS: 1.0000 | ORAL_TABLET | ORAL | 0 refills | Status: DC | PRN

## 2016-03-13 MED ORDER — AMOXICILLIN-POT CLAVULANATE 875-125 MG PO TABS: 1.0000 | ORAL_TABLET | Freq: Two times a day (BID) | ORAL | 0 refills | Status: DC

## 2016-03-13 MED ORDER — NEOMYCIN-POLYMYXIN-HC 3.5-10000-1 OT SUSP: 3.0000 [drp] | Freq: Three times a day (TID) | OTIC | 0 refills | Status: DC

## 2016-03-13 NOTE — ED Notes (Signed)
Patient given discharge instruction, verbalized understand. Patient ambulatory out of the department.  

## 2016-03-13 NOTE — ED Provider Notes (Signed)
[facility]-EMERG[facility]Y [facility]PT Provider Note   CSN: [id number] Arrival date & time: 03/13/16  1246     History   Chief Complaint Chief Complaint  Patient presents with  . Otalgia  . Generalized Body Aches    HPI Melanie [name] is a 50 y.o. female.  Generalized body aches for 1 week with cough and bilateral ear pain right greater than left. Patient is eating and drinking. Patient is also tender in the area surrounding the right ear. No meningeal signs.      Past Medical History:  Diagnosis Date  . Asthma   . GERD (gastroesophageal reflux disease)   . Head injury, acute   . Risk for falls   . Short-term memory loss   . Thyroid disease     Patient Active Problem List   Diagnosis Date Noted  . Pneumococcal pneumonia (HCC) 11/29/2011  . Thrombocytopenia (HCC) 11/29/2011  . Acute respiratory failure (HCC) 11/24/2011  . Septic shock(785.52) 11/24/2011  . BIPOLAR DISORDER UNSPECIFIED 04/08/2009  . 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 PNEUMONIA 04/08/2009  . SEIZURE DISORDER 04/08/2009  . SUBSTANCE ABUSE, MULTIPLE 09/22/2008    Past Surgical History:  Procedure Laterality Date  . BRAIN SURGERY      OB History    Gravida Para Term Preterm AB Living   5 2 2   3      SAB TAB Ectopic Multiple Live Births   3               Home Medications    Prior to Admission medications   Medication Sig Start Date End Date Taking? Authorizing Provider  acetaminophen-codeine (TYLENOL #4) 300-60 MG per tablet Take 1 tablet by mouth 3 (three) times daily as needed. For pain    Historical Provider, MD  albuterol (VENTOLIN HFA) 108 (90 BASE) MCG/ACT inhaler Inhale 2 puffs into the lungs every 6 (six) hours as needed.    Historical Provider, MD  amoxicillin-clavulanate (AUGMENTIN) 875-125 MG tablet Take 1 tablet by mouth every 12 (twelve) hours. 03/13/16   Donnetta HutchingBrian Melane Windholz, MD  busPIRone (BUSPAR) 15 MG tablet Take 15 mg by mouth 3 (three) times daily.    Historical Provider, MD  DULoxetine (CYMBALTA)  30 MG capsule Take 30 mg by mouth 2 (two) times daily.    Historical Provider, MD  HYDROcodone-acetaminophen (NORCO/VICODIN) 5-325 MG tablet Take 1-2 tablets by mouth every 4 (four) hours as needed. 03/13/16   Donnetta HutchingBrian Brenton Joines, MD  levETIRAcetam (KEPPRA) 500 MG tablet Take 500 mg by mouth 2 (two) times daily.    Historical Provider, MD  neomycin-polymyxin-hydrocortisone (CORTISPORIN) 3.5-10000-1 otic suspension Place 3 drops into the right ear 3 (three) times daily. 03/13/16   Donnetta HutchingBrian Brahim Dolman, MD  nortriptyline (PAMELOR) 10 MG capsule Take 10 mg by mouth at bedtime.    Historical Provider, MD  QUEtiapine (SEROQUEL) 50 MG tablet Take 50 mg by mouth at bedtime.    Historical Provider, MD  traZODone (DESYREL) 100 MG tablet Take 100 mg by mouth at bedtime.    Historical Provider, MD    Family History Family History  Problem Relation Age of Onset  . Hypertension Father   . Diabetes Father   . Hypertension Other   . Diabetes Other     Social History Social History  Substance Use Topics  . Smoking status: Current Every Day Smoker    Packs/day: 1.00    Years: 35.00    Types: Cigarettes  . Smokeless tobacco: Never Used  . Alcohol use Yes  Comment: occas     Allergies   Patient has no known allergies.   Review of Systems Review of Systems  All other systems reviewed and are negative.    Physical Exam Updated Vital Signs BP 145/97 (BP Location: Left Arm)   Pulse 82   Temp 98 F (36.7 C) (Oral)   Resp 19   Ht 4\' 11"  (1.499 m)   Wt 130 lb 9.6 oz (59.2 kg)   LMP 12/26/2011   SpO2 100%   BMI 26.38 kg/m   Physical Exam  Constitutional: She is oriented to person, place, and time. She appears well-developed and well-nourished.  HENT:  Head: Normocephalic and atraumatic.  Right external ear canal inflamed and edematous. Difficult to evaluate the tympanic membrane  Eyes: Conjunctivae are normal.  Neck: Neck supple.  No meningeal signs  Cardiovascular: Normal rate and regular  rhythm.   Pulmonary/Chest: Effort normal and breath sounds normal.  Abdominal: Soft. Bowel sounds are normal.  Musculoskeletal: Normal range of motion.  Neurological: She is alert and oriented to person, place, and time.  Skin: Skin is warm and dry.  Psychiatric: She has a normal mood and affect. Her behavior is normal.  Nursing note and vitals reviewed.    ED Treatments / Results  Labs (all labs ordered are listed, but only abnormal results are displayed) Labs Reviewed - No data to display  EKG  EKG Interpretation None       Radiology No results found.  Procedures Procedures (including critical care time)  Medications Ordered in ED Medications  amoxicillin-clavulanate (AUGMENTIN) 875-125 MG per tablet 1 tablet (not administered)     Initial Impression / Assessment and Plan / ED Course  I have reviewed the triage vital signs and the nursing notes.  Pertinent labs & imaging results that were available during my care of the patient were reviewed by me and considered in my medical decision making (see chart for details).  Clinical Course     I suspect patient has a combination of right otitis media and right otitis externa. No evidence of meningitis. Will Rx Augmentin 875 twice a day, Cortisporin Otic, Norco.  Final Clinical Impressions(s) / ED Diagnoses   Final diagnoses:  Acute right otitis media  Otitis externa of right ear, unspecified chronicity, unspecified type    New Prescriptions New Prescriptions   AMOXICILLIN-CLAVULANATE (AUGMENTIN) 875-125 MG TABLET    Take 1 tablet by mouth every 12 (twelve) hours.   HYDROCODONE-ACETAMINOPHEN (NORCO/VICODIN) 5-325 MG TABLET    Take 1-2 tablets by mouth every 4 (four) hours as needed.   NEOMYCIN-POLYMYXIN-HYDROCORTISONE (CORTISPORIN) 3.5-10000-1 OTIC SUSPENSION    Place 3 drops into the right ear 3 (three) times daily.     Donnetta HutchingBrian Jalina Blowers, MD 03/13/16 [phone]

## 2016-03-13 NOTE — Discharge Instructions (Signed)
Prescription for antibiotic, pain medicine, eardrops. Return if worse or follow-up your primary care doctor.

## 2016-03-13 NOTE — ED Triage Notes (Signed)
Pt reports generalized body aches that started 1 week ago. Pt reports intermittent cough. No emesis today. Pt denies fever. Pt reports she has chronic headache from a prior head injury.

## 2016-08-03 ENCOUNTER — Encounter (HOSPITAL_COMMUNITY): Payer: Self-pay | Admitting: *Deleted

## 2016-08-03 ENCOUNTER — Emergency Department (HOSPITAL_COMMUNITY)
Admission: EM | Admit: 2016-08-03 | Discharge: 2016-08-03 | Disposition: A | Discharge status: Home / Self Care | Payer: Medicaid Other | Attending: Emergency Medicine | Admitting: Emergency Medicine

## 2016-08-03 ENCOUNTER — Emergency Department (HOSPITAL_COMMUNITY): Payer: Medicaid Other

## 2016-08-03 DIAGNOSIS — F17210 Nicotine dependence, cigarettes, uncomplicated: Secondary | ICD-10-CM | POA: Diagnosis not present

## 2016-08-03 DIAGNOSIS — M545 Low back pain, unspecified: Secondary | ICD-10-CM

## 2016-08-03 DIAGNOSIS — R110 Nausea: Secondary | ICD-10-CM | POA: Insufficient documentation

## 2016-08-03 DIAGNOSIS — R109 Unspecified abdominal pain: Secondary | ICD-10-CM

## 2016-08-03 DIAGNOSIS — K5900 Constipation, unspecified: Secondary | ICD-10-CM | POA: Diagnosis not present

## 2016-08-03 DIAGNOSIS — J45909 Unspecified asthma, uncomplicated: Secondary | ICD-10-CM | POA: Diagnosis not present

## 2016-08-03 DIAGNOSIS — R1031 Right lower quadrant pain: Secondary | ICD-10-CM | POA: Diagnosis present

## 2016-08-03 DIAGNOSIS — Z79899 Other long term (current) drug therapy: Secondary | ICD-10-CM | POA: Diagnosis not present

## 2016-08-03 LAB — URINALYSIS, ROUTINE W REFLEX MICROSCOPIC
Bilirubin Urine: NEGATIVE
Glucose, UA: NEGATIVE mg/dL
Ketones, ur: NEGATIVE mg/dL
Leukocytes, UA: NEGATIVE
NITRITE: NEGATIVE
Protein, ur: NEGATIVE mg/dL
SPECIFIC GRAVITY, URINE: 1.014 (ref 1.005–1.030)
pH: 6 (ref 5.0–8.0)

## 2016-08-03 LAB — BASIC METABOLIC PANEL
Anion gap: 9 (ref 5–15)
BUN: 12 mg/dL (ref 6–20)
CHLORIDE: 103 mmol/L (ref 101–111)
CO2: 26 mmol/L (ref 22–32)
Calcium: 9.6 mg/dL (ref 8.9–10.3)
Creatinine, Ser: 0.86 mg/dL (ref 0.44–1.00)
GFR calc non Af Amer: >60 mL/min (ref >60)
Glucose, Bld: 103 mg/dL — ABNORMAL HIGH (ref 65–99)
POTASSIUM: 4 mmol/L (ref 3.5–5.1)
SODIUM: 138 mmol/L (ref 135–145)

## 2016-08-03 LAB — CBC WITH DIFFERENTIAL/PLATELET
Basophils Absolute: 0 10*3/uL (ref 0.0–0.1)
Basophils Relative: 0 %
Eosinophils Absolute: 0.1 10*3/uL (ref 0.0–0.7)
Eosinophils Relative: 2 %
HCT: 44.9 % (ref 36.0–46.0)
Hemoglobin: 15 g/dL (ref 12.0–15.0)
LYMPHS ABS: 2.5 10*3/uL (ref 0.7–4.0)
LYMPHS PCT: 30 %
MCH: 31.1 pg (ref 26.0–34.0)
MCHC: 33.4 g/dL (ref 30.0–36.0)
MCV: 93.2 fL (ref 78.0–100.0)
Monocytes Absolute: 0.7 10*3/uL (ref 0.1–1.0)
Monocytes Relative: 9 %
NEUTROS PCT: 59 %
Neutro Abs: 5 10*3/uL (ref 1.7–7.7)
Platelets: 293 10*3/uL (ref 150–400)
RBC: 4.82 MIL/uL (ref 3.87–5.11)
RDW: 12.7 % (ref 11.5–15.5)
WBC: 8.4 10*3/uL (ref 4.0–10.5)

## 2016-08-03 MED ORDER — CYCLOBENZAPRINE HCL 10 MG PO TABS: 10.0000 mg | ORAL_TABLET | Freq: Three times a day (TID) | ORAL | 0 refills | Status: DC

## 2016-08-03 MED ORDER — IBUPROFEN 600 MG PO TABS: 600.0000 mg | ORAL_TABLET | Freq: Four times a day (QID) | ORAL | 0 refills | Status: DC

## 2016-08-03 MED ORDER — PROCHLORPERAZINE EDISYLATE 5 MG/ML IJ SOLN
5.0000 mg | Freq: Once | INTRAMUSCULAR | Status: AC
  Administered 2016-08-03: 5 mg via INTRAVENOUS
  Filled 2016-08-03: qty 2

## 2016-08-03 MED ORDER — MORPHINE SULFATE (PF) 4 MG/ML IV SOLN
4.0000 mg | Freq: Once | INTRAVENOUS | Status: AC
  Administered 2016-08-03: 4 mg via INTRAVENOUS
  Filled 2016-08-03: qty 1

## 2016-08-03 NOTE — ED Provider Notes (Signed)
[facility] Provider Note   CSN: [id number] Arrival date & time: 08/03/16  0957     History   Chief Complaint Chief Complaint  [name] presents with  . Back Pain    HPI Melanie [name] is a 51 y.o. female.  Melanie [name] is a 51 year old female who presents to [facility] emergency department with complaint of lower back pain and lower abdomen pain. Melanie [name] states that on yesterday May 3 at approximately 4 AM she was awakened by pain in her lower back, this pain radiated to her right flank and into her right lower abdomen and pelvis area. She tried applying icy hot, using heat, and taking aspirin, but none of these seem to really work for any length of time. She tried changing positions, but this did not help either  Melanie [name] states that she has not had any recent injury or fall involving her back or flank area. She's not been doing any heavy lifting, pushing, pulling, or straining that she can recall. She denies any recent problems with dysuria. She's not had any blood in Melanie urine. She has had some constipation recently. She presents now for assistance with this problem because she states Melanie pain is getting worse and worse.      Past Medical History:  Diagnosis Date  . Asthma   . GERD (gastroesophageal reflux disease)   . Head injury, acute   . Risk for falls   . Short-term memory loss   . Thyroid disease     [name] Active Problem List   Diagnosis Date Noted  . Pneumococcal pneumonia (HCC) 11/29/2011  . Thrombocytopenia (HCC) 11/29/2011  . Acute respiratory failure (HCC) 11/24/2011  . Septic shock(785.52) 11/24/2011  . BIPOLAR DISORDER UNSPECIFIED 04/08/2009  . 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 PNEUMONIA 04/08/2009  . SEIZURE DISORDER 04/08/2009  . SUBSTANCE ABUSE, MULTIPLE 09/22/2008    Past Surgical History:  Procedure Laterality Date  . BRAIN SURGERY      OB History    Gravida Para Term Preterm AB Living   5 2 2   3      SAB TAB Ectopic Multiple Live  Births   3               Home Medications    Prior to Admission medications   Medication Sig Start Date End Date Taking? Authorizing Provider  acetaminophen-codeine (TYLENOL #4) 300-60 MG per tablet Take 1 tablet by mouth 3 (three) times daily as needed. For pain    Historical Provider, MD  albuterol (VENTOLIN HFA) 108 (90 BASE) MCG/ACT inhaler Inhale 2 puffs into Melanie lungs every 6 (six) hours as needed.    Historical Provider, MD  amoxicillin-clavulanate (AUGMENTIN) 875-125 MG tablet Take 1 tablet by mouth every 12 (twelve) hours. 03/13/16   Donnetta Hutching, MD  busPIRone (BUSPAR) 15 MG tablet Take 15 mg by mouth 3 (three) times daily.    Historical Provider, MD  DULoxetine (CYMBALTA) 30 MG capsule Take 30 mg by mouth 2 (two) times daily.    Historical Provider, MD  HYDROcodone-acetaminophen (NORCO/VICODIN) 5-325 MG tablet Take 1-2 tablets by mouth every 4 (four) hours as needed. 03/13/16   Donnetta Hutching, MD  levETIRAcetam (KEPPRA) 500 MG tablet Take 500 mg by mouth 2 (two) times daily.    Historical Provider, MD  neomycin-polymyxin-hydrocortisone (CORTISPORIN) 3.5-10000-1 otic suspension Place 3 drops into Melanie right ear 3 (three) times daily. 03/13/16   Donnetta Hutching, MD  nortriptyline (PAMELOR) 10 MG capsule Take 10 mg by mouth at  bedtime.    Historical Provider, MD  QUEtiapine (SEROQUEL) 50 MG tablet Take 50 mg by mouth at bedtime.    Historical Provider, MD  traZODone (DESYREL) 100 MG tablet Take 100 mg by mouth at bedtime.    Historical Provider, MD    Family History Family History  Problem Relation Age of Onset  . Hypertension Father   . Diabetes Father   . Hypertension Other   . Diabetes Other     Social History Social History  Substance Use Topics  . Smoking status: Current Every Day Smoker    Packs/day: 1.00    Years: 35.00    Types: Cigarettes  . Smokeless tobacco: Never Used  . Alcohol use Yes     Comment: occas     Allergies   [name] has no known  allergies.   Review of Systems Review of Systems  Constitutional: Negative for activity change, chills and fever.       All ROS Neg except as noted in HPI  HENT: Negative for nosebleeds.   Eyes: Negative for photophobia and discharge.  Respiratory: Negative for cough, shortness of breath and wheezing.   Cardiovascular: Negative for chest pain and palpitations.  Gastrointestinal: Positive for abdominal pain, constipation and nausea. Negative for blood in stool.  Genitourinary: Positive for flank pain. Negative for dysuria, frequency and hematuria.  Musculoskeletal: Positive for back pain. Negative for arthralgias and neck pain.  Skin: Negative.   Neurological: Negative for dizziness, seizures and speech difficulty.  Psychiatric/Behavioral: Negative for confusion and hallucinations.     Physical Exam Updated Vital Signs BP (!) 157/90 (BP Location: Right Arm)   Pulse 87   Temp 98 F (36.7 C) (Oral)   Resp 18   Ht 4\' 11"  (1.499 m)   Wt 59 kg   LMP 12/26/2011   SpO2 98%   BMI 26.26 kg/m   Physical Exam  Constitutional: She is oriented to person, place, and time. She appears well-developed and well-nourished.  Non-toxic appearance.  HENT:  Head: Normocephalic.  Right Ear: Tympanic membrane and external ear normal.  Left Ear: Tympanic membrane and external ear normal.  Eyes: EOM and lids are normal. Pupils are equal, round, and reactive to light.  Neck: Normal range of motion. Neck supple. Carotid bruit is not present.  Cardiovascular: Normal rate, regular rhythm, normal heart sounds, intact distal pulses and normal pulses.   Pulmonary/Chest: Breath sounds normal. No respiratory distress.  Abdominal: Soft. Bowel sounds are normal. There is no splenomegaly or hepatomegaly. There is tenderness in Melanie right upper quadrant, right lower quadrant and suprapubic area. There is CVA tenderness. There is no guarding.  Musculoskeletal: Normal range of motion.       Lumbar back: She  exhibits pain.       Back:  Lymphadenopathy:       Head (right side): No submandibular adenopathy present.       Head (left side): No submandibular adenopathy present.    She has no cervical adenopathy.  Neurological: She is alert and oriented to person, place, and time. She has normal strength. No cranial nerve deficit or sensory deficit.  Skin: Skin is warm and dry.  Psychiatric: She has a normal mood and affect. Her speech is normal.  Nursing note and vitals reviewed.    ED Treatments / Results  Labs (all labs ordered are listed, but only abnormal results are displayed) Labs Reviewed  URINALYSIS, ROUTINE W REFLEX MICROSCOPIC  BASIC METABOLIC PANEL  CBC WITH DIFFERENTIAL/PLATELET  POC  URINE PREG, ED    EKG  EKG Interpretation None       Radiology No results found.  Procedures Procedures (including critical care time)  Medications Ordered in ED Medications  morphine 4 MG/ML injection 4 mg (not administered)  prochlorperazine (COMPAZINE) injection 5 mg (not administered)     Initial Impression / Assessment and Plan / ED Course  I have reviewed [facility]anie triage vital signs and [facility] nursing notes.  Pertinent labs & imaging results that were available during my care of Melanie [name] were reviewed by me and considered in my medical decision making (see chart for details).       Final Clinical Impressions(s) / ED Diagnoses MDM Vital signs within normal limits. Pulse oximetry is 96% on room air. Basic metabolic panel is within normal limits. Complete blood count is within normal limits. Urinalysis shows a small amount of hemoglobin and rare bacteria, otherwise normal.  CT stone study is negative for renal, ureteral, or bladder calculi. There is no hydronephrosis. There is no evidence of bowel obstruction. There is a normal appendix. There no acute problems, no CT findings to suggest Melanie reasoning for Melanie right flank area pain.  I have reexamined Melanie [name]. There no gross  neurologic deficits appreciated.  I suspect that this is a musculoskeletal type pain. [name] will be treated with Flexeril and ibuprofen. I've asked Melanie [name] to see her Medicaid access physician for additional management and workup of this pain. [name] acknowledges understanding of [facility]anie discharge instructions.    Final diagnoses:  Right flank pain  Midline low back pain without sciatica, unspecified chronicity    New Prescriptions Discharge Medication List as of 08/03/2016  1:16 PM    START taking these medications   Details  cyclobenzaprine (FLEXERIL) 10 MG tablet Take 1 tablet (10 mg total) by mouth 3 (three) times daily., Starting Fri 08/03/2016, Print    ibuprofen (ADVIL,MOTRIN) 600 MG tablet Take 1 tablet (600 mg total) by mouth 4 (four) times daily., Starting Fri 08/03/2016, Print         Ivery QualeBryant, Mckensie Scotti, PA-C 08/04/16 1102    Derwood KaplanNanavati, Ankit, MD 08/05/16 [phone]

## 2016-08-03 NOTE — ED Notes (Signed)
ED Provider at bedside. 

## 2016-08-03 NOTE — Discharge Instructions (Signed)
Your vital signs within normal limits. Your oxygen level is 98%. Your electrolytes and complete blood count within normal limits. Your urinalysis is negative for infection or acute problem. The CT scan is negative for kidney stone or acute problem. I suspect that your flank and back and abdomen pain may be musculoskeletal in nature. Please use Flexeril 3 times daily, use ibuprofen 4 times daily with food. Please see your Medicaid access physician for additional evaluation and management.

## 2016-08-03 NOTE — ED Triage Notes (Signed)
Pt has lower back pain that moves around to lower abdomen. Pt denies any urinary problems, other than urine being darker than normal.

## 2017-04-17 ENCOUNTER — Emergency Department (HOSPITAL_COMMUNITY)
Admission: EM | Admit: 2017-04-17 | Discharge: 2017-04-17 | Disposition: A | Discharge status: Home / Self Care | Payer: Medicaid Other | Attending: Emergency Medicine | Admitting: Emergency Medicine

## 2017-04-17 ENCOUNTER — Encounter (HOSPITAL_COMMUNITY): Payer: Self-pay | Admitting: *Deleted

## 2017-04-17 ENCOUNTER — Telehealth: Payer: Self-pay | Admitting: Vascular Surgery

## 2017-04-17 ENCOUNTER — Other Ambulatory Visit: Payer: Self-pay

## 2017-04-17 ENCOUNTER — Emergency Department (HOSPITAL_COMMUNITY): Payer: Medicaid Other

## 2017-04-17 DIAGNOSIS — R079 Chest pain, unspecified: Secondary | ICD-10-CM | POA: Diagnosis present

## 2017-04-17 DIAGNOSIS — J45909 Unspecified asthma, uncomplicated: Secondary | ICD-10-CM | POA: Insufficient documentation

## 2017-04-17 DIAGNOSIS — I771 Stricture of artery: Secondary | ICD-10-CM | POA: Diagnosis not present

## 2017-04-17 DIAGNOSIS — Z79899 Other long term (current) drug therapy: Secondary | ICD-10-CM | POA: Diagnosis not present

## 2017-04-17 DIAGNOSIS — F17210 Nicotine dependence, cigarettes, uncomplicated: Secondary | ICD-10-CM | POA: Insufficient documentation

## 2017-04-17 LAB — CBC
HCT: 45.1 % (ref 36.0–46.0)
Hemoglobin: 14.8 g/dL (ref 12.0–15.0)
MCH: 30.3 pg (ref 26.0–34.0)
MCHC: 32.8 g/dL (ref 30.0–36.0)
MCV: 92.2 fL (ref 78.0–100.0)
PLATELETS: 263 10*3/uL (ref 150–400)
RBC: 4.89 MIL/uL (ref 3.87–5.11)
RDW: 13.7 % (ref 11.5–15.5)
WBC: 10.1 10*3/uL (ref 4.0–10.5)

## 2017-04-17 LAB — BASIC METABOLIC PANEL
Anion gap: 10 (ref 5–15)
BUN: 15 mg/dL (ref 6–20)
CALCIUM: 9.6 mg/dL (ref 8.9–10.3)
CO2: 24 mmol/L (ref 22–32)
CREATININE: 0.85 mg/dL (ref 0.44–1.00)
Chloride: 104 mmol/L (ref 101–111)
GFR calc non Af Amer: >60 mL/min (ref >60)
GLUCOSE: 107 mg/dL — AB (ref 65–99)
Potassium: 4.8 mmol/L (ref 3.5–5.1)
Sodium: 138 mmol/L (ref 135–145)

## 2017-04-17 LAB — TROPONIN I

## 2017-04-17 MED ORDER — IOPAMIDOL (ISOVUE-370) INJECTION 76%
100.0000 mL | Freq: Once | INTRAVENOUS | Status: AC | PRN
  Administered 2017-04-17: 100 mL via INTRAVENOUS

## 2017-04-17 MED ORDER — HYDROCODONE-ACETAMINOPHEN 5-325 MG PO TABS: 2.0000 | ORAL_TABLET | Freq: Four times a day (QID) | ORAL | 0 refills | Status: DC | PRN

## 2017-04-17 MED ORDER — KETOROLAC TROMETHAMINE 30 MG/ML IJ SOLN
30.0000 mg | Freq: Once | INTRAMUSCULAR | Status: AC
  Administered 2017-04-17: 30 mg via INTRAVENOUS

## 2017-04-17 MED ORDER — KETOROLAC TROMETHAMINE 30 MG/ML IJ SOLN
INTRAMUSCULAR | Status: AC
  Filled 2017-04-17: qty 1

## 2017-04-17 MED ORDER — KETOROLAC TROMETHAMINE 60 MG/2ML IM SOLN: 30.0000 mg | Freq: Once | INTRAMUSCULAR | Status: DC

## 2017-04-17 MED ORDER — DIAZEPAM 5 MG PO TABS: 5.0000 mg | ORAL_TABLET | Freq: Three times a day (TID) | ORAL | 0 refills | Status: DC | PRN

## 2017-04-17 MED ORDER — OXYCODONE-ACETAMINOPHEN 5-325 MG PO TABS
1.0000 | ORAL_TABLET | Freq: Once | ORAL | Status: AC
Start: 2017-04-17 — End: 2017-04-17
  Administered 2017-04-17: 1 via ORAL
  Filled 2017-04-17: qty 1

## 2017-04-17 NOTE — ED Notes (Signed)
Meal tray given 

## 2017-04-17 NOTE — ED Triage Notes (Signed)
Pt c/o left sided chest pain under the breast x 3 days. No radiation of pain. Pt reports non productive cough which is slightly worse than normal, pt is a smoker. Pt reports nausea, denies vomiting. Denies SOB.

## 2017-04-17 NOTE — Discharge Instructions (Signed)
The testing today for your chest pain, was reassuring that you do not have a lung or heart problem.  We found an incidental problem with 1 of the arteries leaving your chest, into your left arm.  This does not appear to be causing your chest pain, but needs to be followed up with a vascular surgeon.  Please call Dr. Darrick Pennafields office for a follow-up appointment to be seen about it.  For the discomfort in your chest we are prescribing a pain reliever and muscle relaxer.  Also using heat on it with a heating pad or a warm bath 2 or 3 times a day can be helpful.

## 2017-04-17 NOTE — ED Provider Notes (Signed)
[facility] EMERGENCY DEPARTMENT Provider Note   CSN: [id number] Arrival date & time: 04/17/17  1046     History   Chief Complaint Chief Complaint  Patient presents with  . Chest Pain    HPI Melanie [name] is a 52 y.o. female.  She is here for evaluation of left-sided chest pain, constant for several days which makes it feel like she has to hold onto her chest, but that does not help much.  Reports having shortness of breath and cough as well.  She is not producing much sputum with cough.  She denies fever, vomiting, weakness or dizziness.  She continues to be a smoker.  She has a history of asthma.  There are no other known  HPI  Past Medical History:  Diagnosis Date  . Asthma   . GERD (gastroesophageal reflux disease)   . Head injury, acute   . Risk for falls   . Short-term memory loss     Patient Active Problem List   Diagnosis Date Noted  . Pneumococcal pneumonia (HCC) 11/29/2011  . Thrombocytopenia (HCC) 11/29/2011  . Acute respiratory failure (HCC) 11/24/2011  . Septic shock(785.52) 11/24/2011  . BIPOLAR DISORDER UNSPECIFIED 04/08/2009  . 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 PNEUMONIA 04/08/2009  . SEIZURE DISORDER 04/08/2009  . SUBSTANCE ABUSE, MULTIPLE 09/22/2008    Past Surgical History:  Procedure Laterality Date  . BRAIN SURGERY    . HERNIA REPAIR      OB History    Gravida Para Term Preterm AB Living   5 2 2   3      SAB TAB Ectopic Multiple Live Births   3               Home Medications    Prior to Admission medications   Medication Sig Start Date End Date Taking? Authorizing Provider  albuterol (VENTOLIN HFA) 108 (90 BASE) MCG/ACT inhaler Inhale 2 puffs into the lungs every 6 (six) hours as needed.   Yes [provider]  busPIRone (BUSPAR) 15 MG tablet Take 15 mg by mouth 3 (three) times daily.   Yes [provider]  diazepam (VALIUM) 5 MG tablet Take 1 tablet (5 mg total) by mouth every 8 (eight) hours as needed for muscle  spasms. 04/17/17   Mancel Bale, MD  HYDROcodone-acetaminophen (NORCO) 5-325 MG tablet Take 2 tablets by mouth every 6 (six) hours as needed for moderate pain. 04/17/17   Mancel Bale, MD    Family History Family History  Problem Relation Age of Onset  . Hypertension Father   . Diabetes Father   . Hypertension Other   . Diabetes Other     Social History Social History   Tobacco Use  . Smoking status: Current Every Day Smoker    Packs/day: 1.00    Years: 35.00    Pack years: 35.00    Types: Cigarettes  . Smokeless tobacco: Never Used  Substance Use Topics  . Alcohol use: Yes    Comment: occas  . Drug use: No    Comment: pt snorts px meds.     Allergies   Patient has no known allergies.   Review of Systems Review of Systems  All other systems reviewed and are negative.    Physical Exam Updated Vital Signs BP 108/81   Pulse 73   Temp 97.7 F (36.5 C) (Oral)   Resp 14   Ht 4\' 11"  (1.499 m)   Wt 59 kg (130 lb)   LMP 12/26/2011  SpO2 100%   BMI 26.26 kg/m   Physical Exam  Constitutional: She is oriented to person, place, and time. She appears well-developed and well-nourished.  HENT:  Head: Normocephalic and atraumatic.  Eyes: Conjunctivae and EOM are normal. Pupils are equal, round, and reactive to light.  Neck: Normal range of motion and phonation normal. Neck supple.  Cardiovascular: Normal rate and regular rhythm.  Pulmonary/Chest: Effort normal. She has no wheezes. She has no rales. She exhibits no tenderness.  Few scattered rhonchi.  Fair air movement bilaterally.  Abdominal: Soft. She exhibits no distension. There is no tenderness. There is no guarding.  Musculoskeletal: Normal range of motion.       Right lower leg: She exhibits no tenderness and no edema.       Left lower leg: She exhibits no tenderness and no edema.  Neurological: She is alert and oriented to person, place, and time. She exhibits normal muscle tone.  Skin: Skin is warm and  dry.  Psychiatric: She has a normal mood and affect. Her behavior is normal. Judgment and thought content normal.  Nursing note and vitals reviewed.    ED Treatments / Results  Labs (all labs ordered are listed, but only abnormal results are displayed) Labs Reviewed  BASIC METABOLIC PANEL - Abnormal; Notable for the following components:      Result Value   Glucose, Bld 107 (*)    All other components within normal limits  CBC  TROPONIN I    EKG  EKG Interpretation  Date/Time:  Wednesday April 17 2017 11:10:03 EST Ventricular Rate:  79 PR Interval:  140 QRS Duration: 68 QT Interval:  386 QTC Calculation: 442 R Axis:   -29 Text Interpretation:  Normal sinus rhythm Normal ECG since last tracing no significant change since last tracing no significant change Reconfirmed by Mancel BaleWentz, Ka Flammer [phone]([id number]) on 04/17/2017 2:05:44 PM       Radiology Dg Chest 2 View  Result Date: 04/17/2017 CLINICAL DATA:  Left-sided chest pain for the past 3 days. EXAM: CHEST  2 VIEW COMPARISON:  Chest x-ray dated January 02, 2012. FINDINGS: The heart size and mediastinal contours are within normal limits. Normal pulmonary vascularity. Atherosclerotic calcification of the aortic arch. No focal consolidation, pleural effusion, or pneumothorax. No acute osseous abnormality. IMPRESSION: No active cardiopulmonary disease. Electronically Signed   By: Obie DredgeWilliam T Derry M.D.   On: 04/17/2017 11:28   Ct Angio Chest Pe W/cm &/or Wo Cm  Result Date: 04/17/2017 CLINICAL DATA:  Left-sided chest pain three days. Pain under the left breast. EXAM: CT ANGIOGRAPHY CHEST WITH CONTRAST TECHNIQUE: Multidetector CT imaging of the chest was performed using the standard protocol during bolus administration of intravenous contrast. Multiplanar CT image reconstructions and MIPs were obtained to evaluate the vascular anatomy. CONTRAST:  100mL ISOVUE-370 IOPAMIDOL (ISOVUE-370) INJECTION 76% COMPARISON:  Two-view chest x-ray 04/17/2017.  FINDINGS: Cardiovascular: The heart size is normal. No significant pericardial effusion is present. Calcifications are present in the descending aorta. There is high-grade stenosis at the origin of the left subclavian artery. A more distal tandem stenosis is also suspected in the left subclavian artery proximal to the vertebral artery takeoff. Additional atherosclerotic calcifications are present at the origins of the innominate and left common carotid artery without significant stenosis. Pulmonary artery opacification is excellent. There are no focal filling defects to suggest pulmonary emboli. Mediastinum/Nodes: There is significant mediastinal or axillary adenopathy is present. Lungs/Pleura: Ill-defined micronodular airspace disease is present at the right lung base. There also some  ground-glass attenuation in the right upper lobe. No other focal nodule, mass, or airspace disease is present lungs. There is no pneumothorax. Upper Abdomen: Limited imaging of the upper abdomen is unremarkable. Musculoskeletal: Vertebral body heights alignment are normal. No focal lytic or blastic lesions are present. The ribs are within normal limits. Review of the MIP images confirms the above findings. IMPRESSION: 1. No pulmonary embolus. 2. Nodular airspace disease involving the right lower lobe a potentially right upper lobe raise concern for atypical infection. Aspiration is not excluded. 3.  Aortic Atherosclerosis (ICD10-I70.0). 4. 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 Questions second tandem high-grade stenosis proximal to the left vertebral artery. Electronically Signed   By: Marin Roberts M.D.   On: 04/17/2017 14:41    Procedures .Critical Care Performed by: Mancel Bale, MD Authorized by: Mancel Bale, MD   Critical care provider statement:    Critical care time (minutes):  40   Critical care start time:  04/17/2017 11:10 AM   Critical care end time:  04/17/2017 4:14 PM   Critical care was  necessary to treat or prevent imminent or life-threatening deterioration of the following conditions:  Respiratory failure   Critical care was time spent personally by me on the following activities:  Blood draw for specimens, development of treatment plan with patient or surrogate, evaluation of patient's response to treatment, obtaining history from patient or surrogate, ordering and performing treatments and interventions, ordering and review of laboratory studies, ordering and review of radiographic studies, pulse oximetry, re-evaluation of patient's condition, review of old charts and discussions with consultants    (including critical care time)  Medications Ordered in ED Medications  ketorolac (TORADOL) 30 MG/ML injection (not administered)  oxyCODONE-acetaminophen (PERCOCET/ROXICET) 5-325 MG per tablet 1 tablet (1 tablet Oral Given 04/17/17 1244)  iopamidol (ISOVUE-370) 76 % injection 100 mL (100 mLs Intravenous Contrast Given 04/17/17 1423)  ketorolac (TORADOL) 30 MG/ML injection 30 mg (30 mg Intravenous Given 04/17/17 1534)     Initial Impression / Assessment and Plan / ED Course  I have reviewed the triage vital signs and the nursing notes.  Pertinent labs & imaging results that were available during my care of the patient were reviewed by me and considered in my medical decision making (see chart for details).  Clinical Course as of Apr 17 1618  Wed Apr 17, 2017  1528 CT Angio Chest PE W/Cm &/Or Wo Cm [EW]  1528 CT Angio Chest PE W/Cm &/Or Wo Cm [EW]    Clinical Course User Index [EW] Mancel Bale, MD     Patient Vitals for the past 24 hrs:  BP Temp Temp src Pulse Resp SpO2 Height Weight  04/17/17 1242 108/81 - - - 14 - - -  04/17/17 1220 111/78 - - 73 18 100 % - -  04/17/17 1109 (!) 107/55 97.7 F (36.5 C) Oral 79 18 96 % - -  04/17/17 1104 - - - - - - 4\' 11"  (1.499 m) 59 kg (130 lb)   I discussed case findings with Dr. Darrick Penna, vascular surgery relative to subclavian  artery stenosis.  He states that this is not causing her chest pain, and that he will evaluate and treat her in the office as needed.  Patient is to call for an appointment and he was given her information.   4:12 PM Reevaluation with update and discussion. After initial assessment and treatment, an updated evaluation reveals she reports the pain is better after the Toradol and has no  further complaints.  At this time she has a normal pulse in the left and right radial arteries, and normal capillary refill, less than 2 seconds in the fingers of the right hand. Mancel Bale     Final Clinical Impressions(s) / ED Diagnoses   Final diagnoses:  Nonspecific chest pain  Subclavian artery stenosis, left (HCC)   Chest pain, nonspecific; doubt ACS, PE or pneumonia.  Incidental left subclavian artery stenosis, with preserved function, pulse and sensation in left hand.  There is compromise.  Doubt dissection.  Nursing Notes Reviewed/ Care Coordinated Applicable Imaging Reviewed Interpretation of Laboratory Data incorporated into ED treatment  The patient appears reasonably screened and/or stabilized for discharge and I doubt any other medical condition or other [facility] requiring further screening, evaluation, or treatment in the ED at this time prior to discharge.  Plan: Home Medications-continue usual; Home Treatments-rest, heat as needed; return here if the recommended treatment, does not improve the symptoms; Recommended follow up-PCP follow-up 1 week and vascular surgery as soon as possible for discussion of subclavian artery stenosis     ED Discharge Orders        Ordered    HYDROcodone-acetaminophen (NORCO) 5-325 MG tablet  Every 6 hours PRN     04/17/17 1618    diazepam (VALIUM) 5 MG tablet  Every 8 hours PRN     04/17/17 1618       Mancel Bale, MD 04/17/17 1620

## 2017-04-17 NOTE — Telephone Encounter (Signed)
Called by Dr Effie ShyWentz regarding this patient with chest pain and incidental finding of left subclavian stenosis.  I do not believe this is the origin of her chest pain.  We will schedule elective outpt visit with us to see if she warrants any intervention for this.  Fabienne Brunsharles Fields, MD [facility] Office: [phone] Pager: [phone]

## 2017-04-18 ENCOUNTER — Other Ambulatory Visit: Payer: Self-pay

## 2017-04-18 DIAGNOSIS — I771 Stricture of artery: Secondary | ICD-10-CM

## 2017-05-13 ENCOUNTER — Emergency Department (HOSPITAL_COMMUNITY)
Admission: EM | Admit: 2017-05-13 | Discharge: 2017-05-14 | Disposition: A | Discharge status: Home / Self Care | Payer: Medicaid Other | Attending: Emergency Medicine | Admitting: Emergency Medicine

## 2017-05-13 ENCOUNTER — Emergency Department (HOSPITAL_COMMUNITY): Payer: Medicaid Other

## 2017-05-13 ENCOUNTER — Encounter (HOSPITAL_COMMUNITY): Payer: Self-pay | Admitting: *Deleted

## 2017-05-13 ENCOUNTER — Other Ambulatory Visit: Payer: Self-pay

## 2017-05-13 DIAGNOSIS — F17210 Nicotine dependence, cigarettes, uncomplicated: Secondary | ICD-10-CM | POA: Insufficient documentation

## 2017-05-13 DIAGNOSIS — J4521 Mild intermittent asthma with (acute) exacerbation: Secondary | ICD-10-CM | POA: Diagnosis not present

## 2017-05-13 DIAGNOSIS — R0789 Other chest pain: Secondary | ICD-10-CM

## 2017-05-13 DIAGNOSIS — Z79899 Other long term (current) drug therapy: Secondary | ICD-10-CM | POA: Diagnosis not present

## 2017-05-13 LAB — BASIC METABOLIC PANEL
Anion gap: 12 (ref 5–15)
BUN: 12 mg/dL (ref 6–20)
CALCIUM: 9.8 mg/dL (ref 8.9–10.3)
CHLORIDE: 104 mmol/L (ref 101–111)
CO2: 24 mmol/L (ref 22–32)
CREATININE: 1.01 mg/dL — AB (ref 0.44–1.00)
GFR calc non Af Amer: >60 mL/min (ref >60)
Glucose, Bld: 108 mg/dL — ABNORMAL HIGH (ref 65–99)
Potassium: 3.8 mmol/L (ref 3.5–5.1)
SODIUM: 140 mmol/L (ref 135–145)

## 2017-05-13 LAB — CBC
HCT: 46 % (ref 36.0–46.0)
Hemoglobin: 15 g/dL (ref 12.0–15.0)
MCH: 29.3 pg (ref 26.0–34.0)
MCHC: 32.6 g/dL (ref 30.0–36.0)
MCV: 89.8 fL (ref 78.0–100.0)
PLATELETS: 276 10*3/uL (ref 150–400)
RBC: 5.12 MIL/uL — AB (ref 3.87–5.11)
RDW: 13.8 % (ref 11.5–15.5)
WBC: 7.8 10*3/uL (ref 4.0–10.5)

## 2017-05-13 MED ORDER — PREDNISONE 20 MG PO TABS: ORAL_TABLET | ORAL | 0 refills | Status: DC

## 2017-05-13 MED ORDER — ALBUTEROL SULFATE (2.5 MG/3ML) 0.083% IN NEBU
5.0000 mg | INHALATION_SOLUTION | Freq: Once | RESPIRATORY_TRACT | Status: AC
  Administered 2017-05-13: 5 mg via RESPIRATORY_TRACT
  Filled 2017-05-13: qty 6

## 2017-05-13 MED ORDER — ONDANSETRON HCL 4 MG/2ML IJ SOLN
4.0000 mg | Freq: Once | INTRAMUSCULAR | Status: AC
  Administered 2017-05-14: 4 mg via INTRAVENOUS
  Filled 2017-05-13: qty 2

## 2017-05-13 MED ORDER — KETOROLAC TROMETHAMINE 30 MG/ML IJ SOLN
30.0000 mg | Freq: Once | INTRAMUSCULAR | Status: AC
  Administered 2017-05-13: 30 mg via INTRAVENOUS
  Filled 2017-05-13: qty 1

## 2017-05-13 MED ORDER — IBUPROFEN 600 MG PO TABS: 600.0000 mg | ORAL_TABLET | Freq: Four times a day (QID) | ORAL | 0 refills | Status: DC | PRN

## 2017-05-13 MED ORDER — METHYLPREDNISOLONE SODIUM SUCC 125 MG IJ SOLR
125.0000 mg | Freq: Once | INTRAMUSCULAR | Status: AC
  Administered 2017-05-13: 125 mg via INTRAVENOUS
  Filled 2017-05-13: qty 2

## 2017-05-13 NOTE — ED Provider Notes (Signed)
[facility] EMERGENCY DEPARTMENT Provider Note   CSN: [id number] Arrival date & time: 05/13/17  2128     History   Chief Complaint Chief Complaint  Patient presents with  . Chest Pain    HPI Melanie [name] is a 52 y.o. female.  HPI Patient presents with 3 days of left-sided chest pain.  Pain is worse with movement and palpation of the left chest.  She has had some shortness of breath and cough productive of thick sputum.  Denies any fever or chills.  No new lower extremity swelling or pain.  Patient was seen last month for similar symptoms.  Had CT angiogram without evidence of PE.  It was noted at the time that she had stenosis of her left subclavian artery and has an appointment to follow-up with vascular surgery as an outpatient.  Vascular surgery, however, at the time did not think this was contributing to her symptoms. Past Medical History:  Diagnosis Date  . Asthma   . GERD (gastroesophageal reflux disease)   . Head injury, acute   . Risk for falls   . Short-term memory loss     Patient Active Problem List   Diagnosis Date Noted  . Pneumococcal pneumonia (HCC) 11/29/2011  . Thrombocytopenia (HCC) 11/29/2011  . Acute respiratory failure (HCC) 11/24/2011  . Septic shock(785.52) 11/24/2011  . BIPOLAR DISORDER UNSPECIFIED 04/08/2009  . 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 PNEUMONIA 04/08/2009  . SEIZURE DISORDER 04/08/2009  . SUBSTANCE ABUSE, MULTIPLE 09/22/2008    Past Surgical History:  Procedure Laterality Date  . BRAIN SURGERY    . HERNIA REPAIR      OB History    Gravida Para Term Preterm AB Living   5 2 2   3      SAB TAB Ectopic Multiple Live Births   3               Home Medications    Prior to Admission medications   Medication Sig Start Date End Date Taking? Authorizing Provider  albuterol (VENTOLIN HFA) 108 (90 BASE) MCG/ACT inhaler Inhale 2 puffs into the lungs every 6 (six) hours as needed.   Yes [provider]  albuterol (PROVENTIL  HFA;VENTOLIN HFA) 108 (90 Base) MCG/ACT inhaler Inhale 2 puffs into the lungs every 4 (four) hours as needed for wheezing or shortness of breath (or coughing). 05/14/17   Dione Booze, MD  HYDROcodone-acetaminophen [facility]) 5-325 MG tablet Take 2 tablets by mouth every 6 (six) hours as needed for moderate pain. 05/14/17   Dione Booze, MD  ibuprofen (ADVIL,MOTRIN) 600 MG tablet Take 1 tablet (600 mg total) by mouth every 6 (six) hours as needed for moderate pain. 05/13/17   Loren Racer, MD  predniSONE (DELTASONE) 20 MG tablet 3 tabs po day one, then 2 po daily x 4 days 05/14/17   Loren Racer, MD    Family History Family History  Problem Relation Age of Onset  . Hypertension Father   . Diabetes Father   . Hypertension Other   . Diabetes Other     Social History Social History   Tobacco Use  . Smoking status: Current Every Day Smoker    Packs/day: 1.00    Years: 35.00    Pack years: 35.00    Types: Cigarettes  . Smokeless tobacco: Never Used  Substance Use Topics  . Alcohol use: Yes    Comment: occas  . Drug use: No    Comment: pt snorts px meds.     Allergies  Patient has no known allergies.   Review of Systems Review of Systems  Constitutional: Negative for chills and fever.  HENT: Negative for sore throat and trouble swallowing.   Eyes: Negative for visual disturbance.  Respiratory: Positive for cough, shortness of breath and wheezing.   Cardiovascular: Positive for chest pain. Negative for palpitations and leg swelling.  Gastrointestinal: Negative for abdominal pain, constipation, diarrhea, nausea and vomiting.  Genitourinary: Negative for dysuria, flank pain and frequency.  Musculoskeletal: Positive for myalgias. Negative for back pain, neck pain and neck stiffness.  Skin: Negative for rash and wound.  Neurological: Negative for dizziness, weakness, light-headedness, numbness and headaches.  All other systems reviewed and are negative.    Physical  Exam Updated Vital Signs BP 114/76   Pulse 97   Temp 98.8 F (37.1 C) (Oral)   Resp (!) 22   Ht 4\' 11"  (1.499 m)   Wt 59 kg (130 lb)   LMP 12/26/2011   SpO2 100%   BMI 26.26 kg/m   Physical Exam  Constitutional: She is oriented to person, place, and time. She appears well-developed and well-nourished. No distress.  HENT:  Head: Normocephalic and atraumatic.  Mouth/Throat: Oropharynx is clear and moist. No oropharyngeal exudate.  Eyes: EOM are normal. Pupils are equal, round, and reactive to light.  Neck: Normal range of motion. Neck supple. No JVD present.  Cardiovascular: Normal rate and regular rhythm. Exam reveals no gallop and no friction rub.  No murmur heard. Pulmonary/Chest: Effort normal. She has wheezes. She has rales. She exhibits tenderness.  Scattered rhonchi and expiratory wheezes.  Chest tenderness reproduced with palpation of the left anterior chest.  No crepitance or deformity.  Abdominal: Soft. Bowel sounds are normal. There is no tenderness. There is no rebound and no guarding.  Musculoskeletal: Normal range of motion. She exhibits no edema or tenderness.  2+ bilateral radial pulses.  No lower extremity swelling, asymmetry or tenderness.  Lymphadenopathy:    She has no cervical adenopathy.  Neurological: She is alert and oriented to person, place, and time.  Moves all extremities without focal deficit.  Sensation intact.  Skin: Skin is warm and dry. Capillary refill takes less than 2 seconds. No rash noted. She is not diaphoretic. No erythema.  Psychiatric: She has a normal mood and affect. Her behavior is normal.  Nursing note and vitals reviewed.    ED Treatments / Results  Labs (all labs ordered are listed, but only abnormal results are displayed) Labs Reviewed  BASIC METABOLIC PANEL - Abnormal; Notable for the following components:      Result Value   Glucose, Bld 108 (*)    Creatinine, Ser 1.01 (*)    All other components within normal limits  CBC  - Abnormal; Notable for the following components:   RBC 5.12 (*)    All other components within normal limits  I-STAT TROPONIN, ED    EKG  EKG Interpretation  Date/Time:  Monday May 13 2017 21:55:38 EST Ventricular Rate:  99 PR Interval:    QRS Duration: 79 QT Interval:  351 QTC Calculation: 451 R Axis:   -70 Text Interpretation:  Sinus rhythm Left anterior fascicular block When compared with ECG of 04/17/2017, No significant change was found Confirmed by Dione BoozeGlick, Mava Suares ([id number]) on 05/13/2017 11:00:19 PM       Radiology Dg Chest 2 View  Result Date: 05/13/2017 CLINICAL DATA:  Acute onset of left-sided chest pain. EXAM: CHEST  2 VIEW COMPARISON:  Chest radiograph and CTA of  the chest performed 04/17/2017 FINDINGS: The lungs are well-aerated and clear. There is no evidence of focal opacification, pleural effusion or pneumothorax. The heart is normal in size; the mediastinal contour is within normal limits. No acute osseous abnormalities are seen. IMPRESSION: No acute cardiopulmonary process seen. Electronically Signed   By: Roanna Raider M.D.   On: 05/13/2017 22:42    Procedures Procedures (including critical care time)  Medications Ordered in ED Medications  methylPREDNISolone sodium succinate (SOLU-MEDROL) 125 mg/2 mL injection 125 mg (125 mg Intravenous Given 05/13/17 2243)  albuterol (PROVENTIL) (2.5 MG/3ML) 0.083% nebulizer solution 5 mg (5 mg Nebulization Given During Downtime 05/13/17 2254)  ketorolac (TORADOL) 30 MG/ML injection 30 mg (30 mg Intravenous Given 05/13/17 2242)  ondansetron (ZOFRAN) injection 4 mg (4 mg Intravenous Given 05/14/17 0014)  ipratropium-albuterol (DUONEB) 0.5-2.5 (3) MG/3ML nebulizer solution 3 mL (3 mLs Nebulization Given 05/14/17 0250)  traMADol (ULTRAM) tablet 50 mg (50 mg Oral Given 05/14/17 0140)  ipratropium-albuterol (DUONEB) 0.5-2.5 (3) MG/3ML nebulizer solution 3 mL (3 mLs Nebulization Given 05/14/17 0253)     Initial Impression / Assessment  and Plan / ED Course  I have reviewed the triage vital signs and the nursing notes.  Pertinent labs & imaging results that were available during my care of the patient were reviewed by me and considered in my medical decision making (see chart for details).     Signed out to oncoming emergency physician pending albuterol treatment and reassessment  Final Clinical Impressions(s) / ED Diagnoses   Final diagnoses:  Exacerbation of intermittent asthma, unspecified asthma severity  Left-sided chest wall pain    ED Discharge Orders        Ordered    predniSONE (DELTASONE) 20 MG tablet     05/13/17 2345    HYDROcodone-acetaminophen (NORCO) 5-325 MG tablet  Every 6 hours PRN     05/14/17 0356    albuterol (PROVENTIL HFA;VENTOLIN HFA) 108 (90 Base) MCG/ACT inhaler  Every 4 hours PRN     05/14/17 0356    ibuprofen (ADVIL,MOTRIN) 600 MG tablet  Every 6 hours PRN     05/13/17 2345       Loren Racer, MD 05/14/17 2231

## 2017-05-13 NOTE — ED Triage Notes (Signed)
Pt brought in by [facility] for c/o chest pain; pt c/o left side chest pain that radiates to under her left breast and around her back; pt states the pain is a "tugging" feeling

## 2017-05-13 NOTE — ED Notes (Signed)
Pt given meal tray for dinner

## 2017-05-14 LAB — I-STAT TROPONIN, ED: TROPONIN I, POC: 0.01 ng/mL (ref 0.00–0.08)

## 2017-05-14 MED ORDER — IPRATROPIUM-ALBUTEROL 0.5-2.5 (3) MG/3ML IN SOLN
3.0000 mL | Freq: Once | RESPIRATORY_TRACT | Status: AC
  Administered 2017-05-14: 3 mL via RESPIRATORY_TRACT
  Filled 2017-05-14: qty 3

## 2017-05-14 MED ORDER — HYDROCODONE-ACETAMINOPHEN 5-325 MG PO TABS: 2.0000 | ORAL_TABLET | Freq: Four times a day (QID) | ORAL | 0 refills | Status: DC | PRN

## 2017-05-14 MED ORDER — ALBUTEROL SULFATE HFA 108 (90 BASE) MCG/ACT IN AERS: 2.0000 | INHALATION_SPRAY | RESPIRATORY_TRACT | 0 refills | Status: AC | PRN

## 2017-05-14 MED ORDER — ONDANSETRON 8 MG PO TBDP: 8.0000 mg | ORAL_TABLET | Freq: Once | ORAL | Status: DC

## 2017-05-14 MED ORDER — TRAMADOL HCL 50 MG PO TABS
50.0000 mg | ORAL_TABLET | Freq: Once | ORAL | Status: AC
  Administered 2017-05-14: 50 mg via ORAL
  Filled 2017-05-14: qty 1

## 2017-05-14 NOTE — ED Provider Notes (Signed)
1:38 AM Patient signed out to me by Dr. Ranae PalmsYelverton.  Asthma exacerbation with left-sided chest pain.  She was given dose of ketorolac and given an albuterol nebulizer treatment.  She states that the breathing has improved, but she still is having significant chest pain.  On reexam, she has diffuse, coarse, expiratory wheezes.  She will be given additional nebulizer treatment with albuterol and ipratropium, and given a dose of oral tramadol.  She noted relatively little improvement with second nebulizer treatment and with tramadol.  On reexam, there is still some coarse expiratory rhonchi, but she is in no respiratory distress whatsoever.  She is given a take-home pack of hydrocodone-acetaminophen and given a prescription for albuterol inhaler in addition to the prednisone and ibuprofen prescriptions written by Dr. Ranae PalmsYelverton.   Dione BoozeGlick, Stevens Magwood, MD 05/14/17 [phone]

## 2017-05-15 MED FILL — Hydrocodone-Acetaminophen Tab 5-325 MG: ORAL | Qty: 6 | Status: AC

## 2017-05-30 ENCOUNTER — Encounter: Payer: Self-pay | Admitting: Vascular Surgery

## 2017-05-30 ENCOUNTER — Other Ambulatory Visit: Payer: Self-pay

## 2017-05-30 ENCOUNTER — Ambulatory Visit (INDEPENDENT_AMBULATORY_CARE_PROVIDER_SITE_OTHER): Payer: Medicaid Other | Admitting: Vascular Surgery

## 2017-05-30 ENCOUNTER — Ambulatory Visit (HOSPITAL_COMMUNITY)
Admission: RE | Admit: 2017-05-30 | Discharge: 2017-05-30 | Disposition: A | Discharge status: Home / Self Care | Payer: Medicaid Other | Source: Ambulatory Visit | Attending: Vascular Surgery | Admitting: Vascular Surgery

## 2017-05-30 VITALS — BP 121/80 | HR 79 | Temp 97.5°F | Resp 16 | Ht 59.0 in | Wt 127.0 lb

## 2017-05-30 DIAGNOSIS — I771 Stricture of artery: Secondary | ICD-10-CM

## 2017-05-30 DIAGNOSIS — R9389 Abnormal findings on diagnostic imaging of other specified body structures: Secondary | ICD-10-CM | POA: Diagnosis not present

## 2017-05-30 LAB — VAS US CAROTID
LCCADSYS: 101 cm/s
LEFT ECA DIAS: -17 cm/s
Left CCA dist dias: 33 cm/s
Left CCA prox dias: 45 cm/s
Left CCA prox sys: 105 cm/s
Left ICA dist dias: -29 cm/s
Left ICA dist sys: -68 cm/s
Left ICA prox dias: 44 cm/s
Left ICA prox sys: 154 cm/s
RCCADSYS: -87 cm/s
RCCAPDIAS: 29 cm/s
RIGHT CCA MID DIAS: 32 cm/s
RIGHT ECA DIAS: -19 cm/s
Right CCA prox sys: 85 cm/s

## 2017-05-30 NOTE — Progress Notes (Signed)
Vitals:   05/30/17 1146  BP: 100/72  Pulse: 82  Resp: 16  Temp: (!) 97.5 F (36.4 C)  TempSrc: Oral  SpO2: 96%  Weight: 127 lb (57.6 kg)  Height: 4\' 11"  (1.499 m)

## 2017-05-30 NOTE — Progress Notes (Signed)
Referring Physician: Dr Durel Salts [facility] ER  Patient name: Melanie [name] MRN: [id number] DOB: [birth year] Sex: female  REASON FOR CONSULT: Left subclavian stenosis  HPI: Melanie [name] is a 52 y.o. female sent for evaluation of the left subclavian artery stenosis.  This was found incidentally on a CT Angio of the chest looking for pulmonary embolus.  The patient has had about 6 weeks history of chest pain.  This was evaluated by Dr. Effie Shy in the ER on April 17, 2017.  She subsequently had a similar evaluation by Dr. Ranae Palms on May 13, 2017.  This was thought to primarily be secondary to her asthma.  The patient was sent out on hydrocodone and Valium.  She denies any numbness or tingling in her left hand.  She has no exertional fatigue in her left arm.  She has no dizziness.  There is no ulcerations fingertips.  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  She smokes greater than 1 pack of cigarettes per day.  Greater than 3 minutes today spent regarding smoking cessation counseling.  Past Medical History:  Diagnosis Date  . Asthma   . GERD (gastroesophageal reflux disease)   . Head injury, acute   . Risk for falls   . Short-term memory loss    Past Surgical History:  Procedure Laterality Date  . BRAIN SURGERY    . HERNIA REPAIR      Family History  Problem Relation Age of Onset  . Hypertension Father   . Diabetes Father   . Hypertension Other   . Diabetes Other     SOCIAL HISTORY: Social History   Socioeconomic History  . Marital status: Divorced    Spouse name: Not on file  . Number of children: Not on file  . Years of education: Not on file  . Highest education level: Not on file  Social Needs  . Financial resource strain: Not on file  . Food insecurity - worry: Not on file  . Food insecurity - inability: Not on file  . Transportation needs - medical: Not on file  . Transportation needs -  non-medical: Not on file  Occupational History  . Not on file  Tobacco Use  . Smoking status: Current Every Day Smoker    Packs/day: 1.00    Years: 35.00    Pack years: 35.00    Types: Cigarettes  . Smokeless tobacco: Never Used  Substance and Sexual Activity  . Alcohol use: Yes    Comment: occas  . Drug use: No    Comment: pt snorts px meds.  . Sexual activity: Not on file  Other Topics Concern  . Not on file  Social History Narrative  . Not on file    No Known Allergies  Current Outpatient Medications  Medication Sig Dispense Refill  . albuterol (PROVENTIL HFA;VENTOLIN HFA) 108 (90 Base) MCG/ACT inhaler Inhale 2 puffs into the lungs every 4 (four) hours as needed for wheezing or shortness of breath (or coughing). 1 Inhaler 0  . albuterol (VENTOLIN HFA) 108 (90 BASE) MCG/ACT inhaler Inhale 2 puffs into the lungs every 6 (six) hours as needed.    . diazepam (VALIUM) 5 MG tablet Take 5 mg by mouth daily.    Marland Kitchen HYDROcodone-acetaminophen (NORCO) 5-325 MG tablet Take 2 tablets by mouth every 6 (six) hours as needed for moderate pain. 6 tablet 0  . ibuprofen (ADVIL,MOTRIN) 600 MG tablet Take 1 tablet (600  mg total) by mouth every 6 (six) hours as needed for moderate pain. 30 tablet 0  . predniSONE (DELTASONE) 20 MG tablet 3 tabs po day one, then 2 po daily x 4 days 11 tablet 0   No current facility-administered medications for this visit.     ROS:   General:  No weight loss, Fever, chills  HEENT: No recent headaches, no nasal bleeding, no visual changes, no sore throat  Neurologic: No dizziness, blackouts, seizures. No recent symptoms of stroke or mini- stroke. No recent episodes of slurred speech, or temporary blindness.  Cardiac: No recent episodes of chest pain/pressure, no shortness of breath at rest.  No shortness of breath with exertion.  Denies history of atrial fibrillation or irregular heartbeat  Vascular: No history of rest pain in feet.  No history of claudication.   No history of non-healing ulcer, No history of DVT   Pulmonary: No home oxygen, no productive cough, no hemoptysis,  No asthma or wheezing  Musculoskeletal:  [ ]  Arthritis, [ ]  Low back pain,  [ ]  Joint pain  Hematologic:No history of hypercoagulable state.  No history of easy bleeding.  No history of anemia  Gastrointestinal: No hematochezia or melena,  No gastroesophageal reflux, no trouble swallowing  Urinary: [ ]  chronic Kidney disease, [ ]  on HD - [ ]  MWF or [ ]  TTHS, [ ]  Burning with urination, [ ]  Frequent urination, [ ]  Difficulty urinating;   Skin: No rashes  Psychological: No history of anxiety,  No history of depression   Physical Examination  Vitals:   05/30/17 1146 05/30/17 1151  BP: 100/72 121/80  Pulse: 82 79  Resp: 16   Temp: (!) 97.5 F (36.4 C)   TempSrc: Oral   SpO2: 96%   Weight: 127 lb (57.6 kg)   Height: 4\' 11"  (1.499 m)     Body mass index is 25.65 kg/m.  General:  Alert and oriented, no acute distress HEENT: Normal Neck: No bruit or JVD Pulmonary: Clear to auscultation bilaterally.  Some tenderness to palpation left upper chest laterally Cardiac: Regular Rate and Rhythm without murmur Abdomen: Soft, non-tender, non-distended, no mass Skin: No rash, no ulcerations Extremity Pulses:  2+ left radial absent right radial, 2+ brachial, femoral, dorsalis pedis, posterior tibial pulses bilaterally Musculoskeletal: No deformity or edema  Neurologic: Upper and lower extremity motor 5/5 and symmetric  DATA:  I reviewed the patient's CT Angio which shows about a 50% narrowing at the origin of the left subclavian artery.  Data: Patient had a carotid duplex ultrasound today.  This showed less than 40% right internal carotid artery stenosis.  40-60% left internal carotid artery stenosis.  Right vertebral artery was antegrade.  Left vertebral artery had some reversal of flow.  Monophasic turbulent waveform in the left subclavian artery consistent with  stenosis.  ASSESSMENT: Patient with asymptomatic left subclavian stenosis.  I do not believe her chest pain is secondary to this.  Consideration would only be given for treating the subclavian stenosis if she developed symptoms of exertional fatigue or posterior circulation issues.  She has none of these.   PLAN: Patient was encouraged to quit smoking.  She will have a follow-up carotid duplex exam in 1 year to follow the asymptomatic left internal carotid artery stenosis.  I discussed with her today the symptoms of exertional fatigue and symptoms related to subclavian stenosis and she will return sooner if she develops any of these.  She did request hydrocodone today at the office  visit but I told her that she would need to discuss this with her primary care physician Dr. Cecelia Byars and apparently she is working on trying to get an outpatient appointment.   Fabienne Bruns, MD [facility] Office: [phone] Pager: [phone]

## 2017-09-05 ENCOUNTER — Encounter: Payer: Self-pay | Admitting: Vascular Surgery

## 2017-11-23 ENCOUNTER — Encounter (HOSPITAL_COMMUNITY): Payer: Self-pay | Admitting: Emergency Medicine

## 2017-11-23 ENCOUNTER — Other Ambulatory Visit: Payer: Self-pay

## 2017-11-23 ENCOUNTER — Emergency Department (HOSPITAL_COMMUNITY): Payer: Medicaid Other

## 2017-11-23 ENCOUNTER — Emergency Department (HOSPITAL_COMMUNITY)
Admission: EM | Admit: 2017-11-23 | Discharge: 2017-11-23 | Disposition: A | Discharge status: Home / Self Care | Payer: Medicaid Other | Attending: Emergency Medicine | Admitting: Emergency Medicine

## 2017-11-23 DIAGNOSIS — S59912A Unspecified injury of left forearm, initial encounter: Secondary | ICD-10-CM | POA: Diagnosis present

## 2017-11-23 DIAGNOSIS — J45909 Unspecified asthma, uncomplicated: Secondary | ICD-10-CM | POA: Insufficient documentation

## 2017-11-23 DIAGNOSIS — S50812A Abrasion of left forearm, initial encounter: Secondary | ICD-10-CM

## 2017-11-23 DIAGNOSIS — W108XXA Fall (on) (from) other stairs and steps, initial encounter: Secondary | ICD-10-CM | POA: Insufficient documentation

## 2017-11-23 DIAGNOSIS — Y92019 Unspecified place in single-family (private) house as the place of occurrence of the external cause: Secondary | ICD-10-CM | POA: Diagnosis not present

## 2017-11-23 DIAGNOSIS — F17210 Nicotine dependence, cigarettes, uncomplicated: Secondary | ICD-10-CM | POA: Diagnosis not present

## 2017-11-23 DIAGNOSIS — Y998 Other external cause status: Secondary | ICD-10-CM | POA: Insufficient documentation

## 2017-11-23 DIAGNOSIS — Y9389 Activity, other specified: Secondary | ICD-10-CM | POA: Insufficient documentation

## 2017-11-23 DIAGNOSIS — M545 Low back pain, unspecified: Secondary | ICD-10-CM

## 2017-11-23 LAB — URINALYSIS, ROUTINE W REFLEX MICROSCOPIC
Bilirubin Urine: NEGATIVE
GLUCOSE, UA: NEGATIVE mg/dL
Ketones, ur: NEGATIVE mg/dL
Leukocytes, UA: NEGATIVE
Nitrite: NEGATIVE
PH: 8 (ref 5.0–8.0)
Protein, ur: NEGATIVE mg/dL
SPECIFIC GRAVITY, URINE: 1.001 — AB (ref 1.005–1.030)

## 2017-11-23 MED ORDER — METHOCARBAMOL 500 MG PO TABS: 500.0000 mg | ORAL_TABLET | Freq: Three times a day (TID) | ORAL | 0 refills | Status: DC | PRN

## 2017-11-23 MED ORDER — MELOXICAM 7.5 MG PO TABS: 15.0000 mg | ORAL_TABLET | Freq: Every day | ORAL | 0 refills | Status: DC

## 2017-11-23 MED ORDER — HYDROCODONE-ACETAMINOPHEN 5-325 MG PO TABS
2.0000 | ORAL_TABLET | Freq: Once | ORAL | Status: AC
  Administered 2017-11-23: 2 via ORAL
  Filled 2017-11-23: qty 2

## 2017-11-23 MED ORDER — KETOROLAC TROMETHAMINE 60 MG/2ML IM SOLN
60.0000 mg | Freq: Once | INTRAMUSCULAR | Status: AC
  Administered 2017-11-23: 60 mg via INTRAMUSCULAR
  Filled 2017-11-23: qty 2

## 2017-11-23 MED ORDER — BACITRACIN ZINC 500 UNIT/GM EX OINT
1.0000 "application " | TOPICAL_OINTMENT | Freq: Two times a day (BID) | CUTANEOUS | Status: DC
  Administered 2017-11-23: 1 via TOPICAL
  Filled 2017-11-23: qty 0.9

## 2017-11-23 NOTE — ED Triage Notes (Addendum)
Patient c/o left arm pain and lower back pain after tripping and falling down steps yesterday. Denies hitting head or LOC. Patient states has laceration to left arm that she has wrapped with gauze. Denies any complications with urination or BMS.

## 2017-11-23 NOTE — ED Provider Notes (Signed)
[facility] EMERGENCY DEPARTMENT Provider Note   CSN: [id number] Arrival date & time: 11/23/17  1227     History   Chief Complaint Chief Complaint  Patient presents with  . Fall    HPI Melanie [name] is a 52 y.o. female.  HPI  52 y/o female - has hx of htn, head injury Presents after having mechanical fall yesterday when she tripped down the stairs of her home - hit the concrete at the bottom - c/o low back pain and L forearm abrasion - she cleaned it prior to arrival and bandaged it but b/c of low back pain wanted to have a f/u exam / evaluation.  Sx are constant, worse with tryhing to walk and not associated with any numbness or weakness of the elgs.  Past Medical History:  Diagnosis Date  . Asthma   . GERD (gastroesophageal reflux disease)   . Head injury, acute   . Hypertension   . Risk for falls   . Short-term memory loss     Patient Active Problem List   Diagnosis Date Noted  . Pneumococcal pneumonia (HCC) 11/29/2011  . Thrombocytopenia (HCC) 11/29/2011  . Acute respiratory failure (HCC) 11/24/2011  . Septic shock(785.52) 11/24/2011  . BIPOLAR DISORDER UNSPECIFIED 04/08/2009  . 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 PNEUMONIA 04/08/2009  . SEIZURE DISORDER 04/08/2009  . SUBSTANCE ABUSE, MULTIPLE 09/22/2008    Past Surgical History:  Procedure Laterality Date  . BRAIN SURGERY    . HERNIA REPAIR       OB History    Gravida  5   Para  2   Term  2   Preterm  0   AB  3   Living        SAB  3   TAB  0   Ectopic  0   Multiple      Live Births               Home Medications    Prior to Admission medications   Medication Sig Start Date End Date Taking? Authorizing Provider  albuterol (PROVENTIL HFA;VENTOLIN HFA) 108 (90 Base) MCG/ACT inhaler Inhale 2 puffs into the lungs every 4 (four) hours as needed for wheezing or shortness of breath (or coughing). 05/14/17  Yes Dione Booze, MD  HYDROcodone-acetaminophen [facility]) 5-325 MG tablet Take 2  tablets by mouth every 6 (six) hours as needed for moderate pain. Patient not taking: Reported on 11/23/2017 05/14/17   Dione Booze, MD  meloxicam (MOBIC) 7.5 MG tablet Take 2 tablets (15 mg total) by mouth daily. 11/23/17   Eber Hong, MD  methocarbamol (ROBAXIN) 500 MG tablet Take 1 tablet (500 mg total) by mouth every 8 (eight) hours as needed for muscle spasms. 11/23/17   Eber Hong, MD    Family History Family History  Problem Relation Age of Onset  . Hypertension Father   . Diabetes Father   . Hypertension Other   . Diabetes Other     Social History Social History   Tobacco Use  . Smoking status: Current Every Day Smoker    Packs/day: 1.00    Years: 35.00    Pack years: 35.00    Types: Cigarettes  . Smokeless tobacco: Never Used  Substance Use Topics  . Alcohol use: Yes    Comment: occas  . Drug use: No    Comment: pt snorts px meds.     Allergies   Patient has no known allergies.   Review of Systems Review of  Systems  Musculoskeletal: Positive for back pain. Negative for neck pain.  Skin: Positive for wound.  Neurological: Negative for weakness and numbness.     Physical Exam Updated Vital Signs BP (!) 143/81 (BP Location: Right Arm)   Pulse (!) 110   Temp (S) 100.3 F (37.9 C) (Oral)   Resp 20   Ht 4\' 11"  (1.499 m)   Wt 59 kg   LMP 12/26/2011   SpO2 98%   BMI 26.26 kg/m   Physical Exam  Constitutional: She appears well-developed and well-nourished. No distress.  HENT:  Head: Normocephalic.  Eyes: Conjunctivae are normal. No scleral icterus.  Cardiovascular: Normal rate and regular rhythm.  Pulmonary/Chest: Effort normal and breath sounds normal.  Musculoskeletal: Normal range of motion. She exhibits tenderness ( ttp over the abrasion site of the left extensor forearm proximally). She exhibits no edema.  Full range of motion of all 4 extremities, the joints are diffusely supple compartments are soft but there is tenderness over the left  proximal forearm.  Tenderness present over the lumbar spine as well as the pelvis paraspinal muscles bilaterally  Neurological: She is alert. Coordination normal.  Sensation and motor intact  Skin: Skin is warm and dry. She is not diaphoretic.  Abrasion noted to the left proximal forearm, no drainage, no surrounding erythema     ED Treatments / Results  Labs (all labs ordered are listed, but only abnormal results are displayed) Labs Reviewed  URINALYSIS, ROUTINE W REFLEX MICROSCOPIC - Abnormal; Notable for the following components:      Result Value   Color, Urine STRAW (*)    Specific Gravity, Urine 1.001 (*)    Hgb urine dipstick SMALL (*)    Bacteria, UA RARE (*)    All other components within normal limits    EKG None  Radiology Dg Lumbar Spine Complete  Result Date: 11/23/2017 CLINICAL DATA:  Low back pain after fall EXAM: LUMBAR SPINE - COMPLETE 4+ VIEW COMPARISON:  CT abdomen/pelvis dated 08/03/2016 FINDINGS: Five lumbar-type vertebral bodies. Normal lumbar lordosis. Vertebral body heights are maintained. 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 No spondylolisthesis. Visualized bony pelvis appears intact. 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 No spondylolisthesis. Electronically Signed   By: Charline BillsSriyesh  Krishnan M.D.   On: 11/23/2017 13:40   Dg Forearm Left  Result Date: 11/23/2017 CLINICAL DATA:  Fall, pain EXAM: LEFT FOREARM - 2 VIEW COMPARISON:  None. FINDINGS: No fracture or dislocation is seen. The joint spaces are preserved. The visualized soft tissues are unremarkable. IMPRESSION: Negative. Electronically Signed   By: Charline BillsSriyesh  Krishnan M.D.   On: 11/23/2017 13:40    Procedures Procedures (including critical care time)  Medications Ordered in ED Medications  bacitracin ointment 1 application (1 application Topical Given 11/23/17 1259)  HYDROcodone-acetaminophen (NORCO/VICODIN) 5-325 MG per tablet 2 tablet (has no administration in  time range)  ketorolac (TORADOL) injection 60 mg (60 mg Intramuscular Given 11/23/17 1257)     Initial Impression / Assessment and Plan / ED Course  I have reviewed the triage vital signs and the nursing notes.  Pertinent labs & imaging results that were available during my care of the patient were reviewed by me and considered in my medical decision making (see chart for details).  Clinical Course as of Nov 23 1445  Sat Nov 23, 2017  1344 Bilateral pars defects are seen, the patient will need to have close follow-up in the outpatient setting.  Will refer to  spinal surgery.  She does have a temperature of 100.3 but no other findings of potential infection, will add on urinalysis.   [BM]  1443 Urinalysis negative, the patient has been coughing but has totally clear lung sounds, she has ongoing back pain but is been made aware that her x-ray shows the pars defect.  - no obvious traumatic fractures - pt will be d/c home with pain control with nsaids as well as robaxin and f/u with PCP and ortho - she expressed understanding.   [BM]    Clinical Course User Index [BM] Eber Hong, MD   L-spine and forearm x-rays pending, the patient states she is up-to-date on tetanus, she will have wound care of her left forearm, there is no repairable laceration and this is been present for over 36 hours and would not be amenable to sutures even if it needed it.  No signs of secondary cellulitis. Toradol given, low risk for frx given exam.  Patient does have a history of some imbalance secondary to her prior head injury, none of that is new.  Final Clinical Impressions(s) / ED Diagnoses   Final diagnoses:  Bilateral low back pain without sciatica, unspecified chronicity  Abrasion of forearm, left, initial encounter    ED Discharge Orders         Ordered    meloxicam (MOBIC) 7.5 MG tablet  Daily     11/23/17 1447    methocarbamol (ROBAXIN) 500 MG tablet  Every 8 hours PRN     11/23/17 1447             Eber Hong, MD 11/23/17 1447

## 2017-11-23 NOTE — Discharge Instructions (Signed)
Please keep your wound clean and dry, you have no signs of infection at this time.  Your x-ray shows that your lower back has a slight (pars defect) which is a chronic finding and not likely related to your fall today.  This may cause some ongoing pain and thus I have prescribed a medication called Mobic which you can take twice daily as well as a medication called Robaxin which can be taken 3 times daily and is a muscle relaxer  If you should develop increasing pain fever vomiting numbness or weakness of the legs or any other severe or worsening symptoms please return to the emergency department, otherwise you will need to follow-up with your family doctor.  I have given you the phone number for the local orthopedist as well  [facility] Doctor List    Kari BaarsEdward Hawkins MD. Specialty: Pulmonary Disease Contact information: [address]  [address]  [place] [address]  [phone]   Syliva OvermanMargaret Simpson, MD. Specialty: [facility] Contact information: [address]  [place] [address]  [phone]   Lilyan PuntScott Luking, MD. Specialty: [facility] Contact information: [address]  [address]  [place] [address]  [phone]   Avon Gullyesfaye Fanta, MD Specialty: Internal Medicine Contact information: [address]  [place] [address]  [phone]   Catalina PizzaZach Hall, MD. Specialty: Internal Medicine Contact information: [address]  [place] [address]  [phone]    [facility] (Dr. Selena BattenKim) Specialty: Family Medicine Contact information: [address]  [place] [address]  [phone]   John GiovanniStephen Knowlton, MD. Specialty: [facility] Contact information: [address]  [address]  [place] [address]  [phone]   Carylon Perchesoy Fagan, MD. Specialty: Internal Medicine Contact information: [address]  [address]  [place] [address]  [phone]    [facility]  [address] [place] [address] [phone]  Services [facility] offers a variety of basic health services.  Services include but are not limited to: Blood pressure checks  Heart rate checks  Blood sugar checks  Urine analysis  Rapid strep tests  Pregnancy tests.  Health education and referrals  People needing more complex services will be directed to a physician online. Using these virtual visits, doctors can evaluate and prescribe medicine and treatments. There will be no medication on-site, though [facility] will help patients fill their prescriptions at little to no cost.   For More information please go to: [email or web address]

## 2017-11-23 NOTE — ED Notes (Signed)
Left forearm abrasion cleaned with NS applied bacitracin and wrapped

## 2017-12-04 ENCOUNTER — Telehealth: Payer: Self-pay | Admitting: Orthopedic Surgery

## 2017-12-04 NOTE — Telephone Encounter (Signed)
Patient's daughter called to see why we would not see her mom. I tried to explain to her exactly what I had told her mom earlier. She began to holler at me saying that her mom had been referred by two different hospitals to our office and she didn't understand why she needs to go to [facility]. I explained that they are listed on her mom's Medicaid as PCP and because they are she will need to see or call them and be referred to our office. The daughter began talking with some ugly talk and I told her that I was not going to talk with her if she was going to talk like that.  She stated that her mom's PCP doesn't understand why we are asking for a referral from them. This is not out of the normal for [place] to ask for a referral from any PCP because of Medicaid and we have received them from [facility]. The daughter just kept raising her voice and stated" what if something happens to my mom while she is waiting to be seen by [facility]". I didn't say anything to that remark, but I did say that I am sorry she is hurting but I have to go by the rules. She hung up on me.

## 2017-12-04 NOTE — Telephone Encounter (Signed)
Patient called to schedule an er follow up appointment with Dr. Romeo Apple for back pain. During our conversation she stated she has Medicaid for her insurance, I explained to her that she would need to be referred to our office by her PCP after going through the explanation several times she told me she would contact them and hung up.

## 2018-01-01 ENCOUNTER — Encounter: Payer: Self-pay | Admitting: Orthopaedic Surgery

## 2018-01-01 ENCOUNTER — Ambulatory Visit: Payer: Self-pay | Admitting: Orthopaedic Surgery

## 2018-01-03 ENCOUNTER — Emergency Department (HOSPITAL_COMMUNITY): Payer: Medicaid Other

## 2018-01-03 ENCOUNTER — Other Ambulatory Visit: Payer: Self-pay

## 2018-01-03 ENCOUNTER — Encounter (HOSPITAL_COMMUNITY): Payer: Self-pay | Admitting: Emergency Medicine

## 2018-01-03 ENCOUNTER — Observation Stay (HOSPITAL_BASED_OUTPATIENT_CLINIC_OR_DEPARTMENT_OTHER)
Admission: EM | Admit: 2018-01-03 | Discharge: 2018-01-05 | Disposition: A | Discharge status: Home / Self Care | Payer: Medicaid Other | Source: Home / Self Care | Attending: Internal Medicine | Admitting: Internal Medicine

## 2018-01-03 DIAGNOSIS — Y929 Unspecified place or not applicable: Secondary | ICD-10-CM | POA: Insufficient documentation

## 2018-01-03 DIAGNOSIS — Z8679 Personal history of other diseases of the circulatory system: Secondary | ICD-10-CM

## 2018-01-03 DIAGNOSIS — Y999 Unspecified external cause status: Secondary | ICD-10-CM

## 2018-01-03 DIAGNOSIS — N179 Acute kidney failure, unspecified: Secondary | ICD-10-CM

## 2018-01-03 DIAGNOSIS — D649 Anemia, unspecified: Secondary | ICD-10-CM

## 2018-01-03 DIAGNOSIS — R51 Headache: Secondary | ICD-10-CM

## 2018-01-03 DIAGNOSIS — Z23 Encounter for immunization: Secondary | ICD-10-CM | POA: Insufficient documentation

## 2018-01-03 DIAGNOSIS — D72810 Lymphocytopenia: Secondary | ICD-10-CM

## 2018-01-03 DIAGNOSIS — F17210 Nicotine dependence, cigarettes, uncomplicated: Secondary | ICD-10-CM

## 2018-01-03 DIAGNOSIS — R6520 Severe sepsis without septic shock: Secondary | ICD-10-CM | POA: Insufficient documentation

## 2018-01-03 DIAGNOSIS — D500 Iron deficiency anemia secondary to blood loss (chronic): Secondary | ICD-10-CM

## 2018-01-03 DIAGNOSIS — J189 Pneumonia, unspecified organism: Secondary | ICD-10-CM

## 2018-01-03 DIAGNOSIS — Y939 Activity, unspecified: Secondary | ICD-10-CM | POA: Insufficient documentation

## 2018-01-03 DIAGNOSIS — J188 Other pneumonia, unspecified organism: Secondary | ICD-10-CM | POA: Insufficient documentation

## 2018-01-03 DIAGNOSIS — S0081XA Abrasion of other part of head, initial encounter: Secondary | ICD-10-CM

## 2018-01-03 DIAGNOSIS — E876 Hypokalemia: Secondary | ICD-10-CM | POA: Insufficient documentation

## 2018-01-03 DIAGNOSIS — Z79899 Other long term (current) drug therapy: Secondary | ICD-10-CM | POA: Insufficient documentation

## 2018-01-03 DIAGNOSIS — J45901 Unspecified asthma with (acute) exacerbation: Secondary | ICD-10-CM

## 2018-01-03 DIAGNOSIS — I10 Essential (primary) hypertension: Secondary | ICD-10-CM

## 2018-01-03 DIAGNOSIS — W1830XA Fall on same level, unspecified, initial encounter: Secondary | ICD-10-CM

## 2018-01-03 DIAGNOSIS — W19XXXA Unspecified fall, initial encounter: Secondary | ICD-10-CM

## 2018-01-03 LAB — CBC WITH DIFFERENTIAL/PLATELET
BASOS ABS: 0 10*3/uL (ref 0.0–0.1)
Basophils Relative: 0 %
EOS PCT: 0 %
Eosinophils Absolute: 0 10*3/uL (ref 0.0–0.7)
HEMATOCRIT: 33.1 % — AB (ref 36.0–46.0)
HEMOGLOBIN: 11.2 g/dL — AB (ref 12.0–15.0)
LYMPHS PCT: 16 %
Lymphs Abs: 0.5 10*3/uL — ABNORMAL LOW (ref 0.7–4.0)
MCH: 30.5 pg (ref 26.0–34.0)
MCHC: 33.8 g/dL (ref 30.0–36.0)
MCV: 90.2 fL (ref 78.0–100.0)
MONO ABS: 0.2 10*3/uL (ref 0.1–1.0)
MONOS PCT: 8 %
NEUTROS ABS: 2.1 10*3/uL (ref 1.7–7.7)
Neutrophils Relative %: 76 %
Platelets: 181 10*3/uL (ref 150–400)
RBC: 3.67 MIL/uL — ABNORMAL LOW (ref 3.87–5.11)
RDW: 13.7 % (ref 11.5–15.5)
WBC: 2.8 10*3/uL — ABNORMAL LOW (ref 4.0–10.5)

## 2018-01-03 LAB — COMPREHENSIVE METABOLIC PANEL
ALK PHOS: 62 U/L (ref 38–126)
ALT: 20 U/L (ref 0–44)
ANION GAP: 8 (ref 5–15)
AST: 27 U/L (ref 15–41)
Albumin: 3.2 g/dL — ABNORMAL LOW (ref 3.5–5.0)
BILIRUBIN TOTAL: 0.8 mg/dL (ref 0.3–1.2)
BUN: 30 mg/dL — ABNORMAL HIGH (ref 6–20)
CALCIUM: 8.5 mg/dL — AB (ref 8.9–10.3)
CO2: 23 mmol/L (ref 22–32)
Chloride: 105 mmol/L (ref 98–111)
Creatinine, Ser: 1.27 mg/dL — ABNORMAL HIGH (ref 0.44–1.00)
GFR, EST AFRICAN AMERICAN: 54 mL/min — AB (ref >60)
GFR, EST NON AFRICAN AMERICAN: 47 mL/min — AB (ref >60)
GLUCOSE: 116 mg/dL — AB (ref 70–99)
POTASSIUM: 3 mmol/L — AB (ref 3.5–5.1)
Sodium: 136 mmol/L (ref 135–145)
TOTAL PROTEIN: 6.8 g/dL (ref 6.5–8.1)

## 2018-01-03 LAB — MAGNESIUM: Magnesium: 1.9 mg/dL (ref 1.7–2.4)

## 2018-01-03 LAB — LACTIC ACID, PLASMA: LACTIC ACID, VENOUS: 1.8 mmol/L (ref 0.5–1.9)

## 2018-01-03 MED ORDER — LIDOCAINE 5 % EX PTCH
1.0000 | MEDICATED_PATCH | CUTANEOUS | Status: DC
  Administered 2018-01-03 – 2018-01-04 (×2): 1 via TRANSDERMAL
  Filled 2018-01-03 (×2): qty 1

## 2018-01-03 MED ORDER — POTASSIUM CHLORIDE CRYS ER 20 MEQ PO TBCR
60.0000 meq | EXTENDED_RELEASE_TABLET | Freq: Once | ORAL | Status: AC
  Administered 2018-01-03: 60 meq via ORAL
  Filled 2018-01-03: qty 3

## 2018-01-03 MED ORDER — SODIUM CHLORIDE 0.9 % IV BOLUS (SEPSIS)
1000.0000 mL | Freq: Once | INTRAVENOUS | Status: AC
  Administered 2018-01-03: 1000 mL via INTRAVENOUS

## 2018-01-03 MED ORDER — ONDANSETRON HCL 4 MG PO TABS
4.0000 mg | ORAL_TABLET | Freq: Three times a day (TID) | ORAL | Status: DC | PRN
  Administered 2018-01-05: 4 mg via ORAL
  Filled 2018-01-03: qty 1

## 2018-01-03 MED ORDER — LEVOFLOXACIN IN D5W 500 MG/100ML IV SOLN
500.0000 mg | Freq: Once | INTRAVENOUS | Status: AC
  Administered 2018-01-03: 500 mg via INTRAVENOUS
  Filled 2018-01-03: qty 100

## 2018-01-03 MED ORDER — PREDNISONE 20 MG PO TABS
40.0000 mg | ORAL_TABLET | Freq: Every day | ORAL | Status: DC
  Administered 2018-01-03 – 2018-01-05 (×3): 40 mg via ORAL
  Filled 2018-01-03 (×3): qty 2

## 2018-01-03 MED ORDER — ENOXAPARIN SODIUM 40 MG/0.4ML ~~LOC~~ SOLN
40.0000 mg | SUBCUTANEOUS | Status: DC
  Administered 2018-01-03 – 2018-01-04 (×2): 40 mg via SUBCUTANEOUS
  Filled 2018-01-03 (×2): qty 0.4

## 2018-01-03 MED ORDER — IPRATROPIUM-ALBUTEROL 0.5-2.5 (3) MG/3ML IN SOLN
3.0000 mL | Freq: Four times a day (QID) | RESPIRATORY_TRACT | Status: DC
  Administered 2018-01-03 – 2018-01-05 (×7): 3 mL via RESPIRATORY_TRACT
  Filled 2018-01-03 (×7): qty 3

## 2018-01-03 MED ORDER — PNEUMOCOCCAL VAC POLYVALENT 25 MCG/0.5ML IJ INJ
0.5000 mL | INJECTION | INTRAMUSCULAR | Status: AC
  Administered 2018-01-04: 0.5 mL via INTRAMUSCULAR
  Filled 2018-01-03: qty 0.5

## 2018-01-03 MED ORDER — ACETAMINOPHEN 500 MG PO TABS
1000.0000 mg | ORAL_TABLET | Freq: Three times a day (TID) | ORAL | Status: DC | PRN
  Administered 2018-01-03 – 2018-01-04 (×2): 1000 mg via ORAL
  Filled 2018-01-03 (×2): qty 2

## 2018-01-03 MED ORDER — LEVOFLOXACIN 500 MG PO TABS
500.0000 mg | ORAL_TABLET | Freq: Every day | ORAL | Status: DC
  Administered 2018-01-04 – 2018-01-05 (×2): 500 mg via ORAL
  Filled 2018-01-03 (×2): qty 1

## 2018-01-03 MED ORDER — INFLUENZA VAC SPLIT QUAD 0.5 ML IM SUSY
0.5000 mL | PREFILLED_SYRINGE | INTRAMUSCULAR | Status: AC
  Administered 2018-01-04: 0.5 mL via INTRAMUSCULAR
  Filled 2018-01-03: qty 0.5

## 2018-01-03 MED ORDER — SODIUM CHLORIDE 0.9 % IV SOLN
INTRAVENOUS | Status: AC
  Administered 2018-01-03 – 2018-01-04 (×3): via INTRAVENOUS

## 2018-01-03 NOTE — H&P (Addendum)
History and Physical    Melanie [name] ZOX:[id number] DOB: [birth year] DOA: 01/03/2018  PCP: Tanna Furry, MD Patient coming from: Home  Chief Complaint: Cough, shortness of breath, generalized weakness, falls  HPI: Melanie [name] is a 52 y.o. female with medical history significant of hypertension, asthma, GERD, previous head injury presenting to the hospital with a chief complaint of cough, shortness of breath, generalized weakness, and falls.  Patient states she has been having fevers, chills, and body aches for the past 5 days and symptoms have been getting progressively worse.  She has been coughing up light brown-colored sputum.  She has been feeling weak and tired.  She is not sure if she had any chest pain.  Reports having a poor appetite.  Reports having nonbloody emesis several times since yesterday.  States she is still nauseous but wants to eat potato wedges.  She did not receive a flu shot this year.  Son at bedside states patient has been disoriented and has had several falls in the past few days.  States she has been unsteady and dropping objects for the last few years but lately symptoms have been worse.  He does not believe patient ever lost consciousness during any of these falls.  Per ED nursing staff, patient had 2 empty bottles with her (gabapentin and Flexeril) which were filled by the pharmacy on December 24, 2017.  Per patient, she has been taking gabapentin and Flexeril but son at bedside denied.  Patient stated she takes these medications to help her sleep.  It was difficult to obtain any further history because patient and son had an argument when I was present in the room.  They both had conflicting histories when it came to gabapentin and Flexeril use.  ED Course: Vitals on arrival to the ED-afebrile, pulse 122, respiratory rate 17, blood pressure 103/53, and SPO2 93% on room air.  Labs showing white count of 2.8 and normal lactate.  Chest x-ray showing  consolidation in the right lung, more confluent in the upper lobe and more subtle areas of consolidation in the left lower thorax.  Findings consistent with likely multifocal pneumonia.  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  EKG showing sinus tachycardia (HR 115) and left anterior fascicular block (similar to prior tracing from February 2019).  Patient received 1 L bolus of normal saline in the ED and Levaquin was ordered.  Review of Systems: As per HPI otherwise 10 point review of systems negative.  Past Medical History:  Diagnosis Date  . Asthma   . GERD (gastroesophageal reflux disease)   . Head injury, acute   . Hypertension   . Risk for falls   . Short-term memory loss     Past Surgical History:  Procedure Laterality Date  . BRAIN SURGERY    . HERNIA REPAIR     Social history  reports that she has been smoking cigarettes. She has a 35.00 pack-year smoking history. She has never used smokeless tobacco. She reports that she drinks alcohol. She reports that she does not use drugs.  No Known Allergies  Family History  Problem Relation Age of Onset  . Hypertension Father   . Diabetes Father   . Hypertension Other   . Diabetes Other     Prior to Admission medications   Medication Sig Start Date End Date Taking? Authorizing Provider  albuterol (PROVENTIL HFA;VENTOLIN HFA) 108 (90 Base) MCG/ACT inhaler Inhale 2 puffs into the  lungs every 4 (four) hours as needed for wheezing or shortness of breath (or coughing). 05/14/17   Dione Booze, MD  HYDROcodone-acetaminophen [facility]) 5-325 MG tablet Take 2 tablets by mouth every 6 (six) hours as needed for moderate pain. Patient not taking: Reported on 11/23/2017 05/14/17   Dione Booze, MD  meloxicam (MOBIC) 7.5 MG tablet Take 2 tablets (15 mg total) by mouth daily. 11/23/17   Eber Hong, MD  methocarbamol (ROBAXIN) 500 MG tablet Take 1 tablet (500 mg total) by  mouth every 8 (eight) hours as needed for muscle spasms. 11/23/17   Eber Hong, MD    Physical Exam: Vitals:   01/03/18 1330 01/03/18 1500 01/03/18 1530 01/03/18 1600  BP: 110/63 98/64 (!) 87/61 97/66  Pulse: (!) 114 (!) 108 (!) 106 (!) 109  Resp: (!) 33 (!) 31 (!) 22 (!) 21  Temp:      TempSrc:      SpO2: 94% 93% 92% 95%  Weight:      Height:       Physical Exam  Constitutional: She is oriented to person, place, and time.  Frail appearing  HENT:  Abrasions on the nose Dry mucous membranes Very poor dentition  Eyes: Pupils are equal, round, and reactive to light. EOM are normal. Right eye exhibits no discharge. Left eye exhibits no discharge.  Neck: Neck supple. No tracheal deviation present.  Cardiovascular: Normal rate and intact distal pulses. Exam reveals no gallop and no friction rub.  No murmur heard. Pulmonary/Chest:  Diffuse wheezing and rhonchi. No accessory muscle use.  Speaking in full sentences.  Abdominal: Soft. Bowel sounds are normal. She exhibits no distension. There is no tenderness.  Musculoskeletal: She exhibits no edema.  Neurological: She is alert and oriented to person, place, and time. No cranial nerve deficit.  Strength 5/5 throughout  Skin: Skin is warm and dry.    Labs on Admission: I have personally reviewed following labs and imaging studies  CBC: Recent Labs  Lab 01/03/18 1401  WBC 2.8*  NEUTROABS 2.1  HGB 11.2*  HCT 33.1*  MCV 90.2  PLT 181   Basic Metabolic Panel: Recent Labs  Lab 01/03/18 1401  NA 136  K 3.0*  CL 105  CO2 23  GLUCOSE 116*  BUN 30*  CREATININE 1.27*  CALCIUM 8.5*   GFR: Estimated Creatinine Clearance: 39.6 mL/min (A) (by C-G formula based on SCr of 1.27 mg/dL (H)). Liver Function Tests: Recent Labs  Lab 01/03/18 1401  AST 27  ALT 20  ALKPHOS 62  BILITOT 0.8  PROT 6.8  ALBUMIN 3.2*   No results for input(s): LIPASE, AMYLASE in the last 168 hours. No results for input(s): AMMONIA in the last  168 hours. Coagulation Profile: No results for input(s): INR, PROTIME in the last 168 hours. Cardiac Enzymes: No results for input(s): CKTOTAL, CKMB, CKMBINDEX, TROPONINI in the last 168 hours. BNP (last 3 results) No results for input(s): PROBNP in the last 8760 hours. HbA1C: No results for input(s): HGBA1C in the last 72 hours. CBG: No results for input(s): GLUCAP in the last 168 hours. Lipid Profile: No results for input(s): CHOL, HDL, LDLCALC, TRIG, CHOLHDL, LDLDIRECT in the last 72 hours. Thyroid Function Tests: No results for input(s): TSH, T4TOTAL, FREET4, T3FREE, THYROIDAB in the last 72 hours. Anemia Panel: No results for input(s): VITAMINB12, FOLATE, FERRITIN, TIBC, IRON, RETICCTPCT in the last 72 hours. Urine analysis:    Component Value Date/Time   COLORURINE STRAW (A) 11/23/2017 1346   APPEARANCEUR  CLEAR 11/23/2017 1346   LABSPEC 1.001 (L) 11/23/2017 1346   PHURINE 8.0 11/23/2017 1346   GLUCOSEU NEGATIVE 11/23/2017 1346   HGBUR SMALL (A) 11/23/2017 1346   BILIRUBINUR NEGATIVE 11/23/2017 1346   KETONESUR NEGATIVE 11/23/2017 1346   PROTEINUR NEGATIVE 11/23/2017 1346   UROBILINOGEN 0.2 01/02/2012 1615   NITRITE NEGATIVE 11/23/2017 1346   LEUKOCYTESUR NEGATIVE 11/23/2017 1346    Radiological Exams on Admission: Dg Chest 2 View  Result Date: 01/03/2018 CLINICAL DATA:  Cough congestion and wheezing.  Shortness of breath. EXAM: CHEST - 2 VIEW COMPARISON:  Chest radiograph 05/13/2017, chest CT 04/17/2017 FINDINGS: Cardiomediastinal silhouette is normal. Mediastinal contours appear intact. There is no evidence of pleural effusion or pneumothorax. Interval development of patchy and nodular airspace consolidation throughout the right lung, more confluent in the upper lobe. Scattered areas of airspace consolidation in the left lower hemithorax. Osseous structures are without acute abnormality. Soft tissues are grossly normal. IMPRESSION: Interval development of patchy and  nodular airspace consolidation throughout the right lung, more confluent in the upper lobe. More subtle areas of airspace consolidation the left lower thorax. In the acute clinical settings, this likely represents multifocal pneumonia, however follow-up to resolution after empiric treatment should be considered. Electronically Signed   By: Ted Mcalpine M.D.   On: 01/03/2018 14:42   Ct Head Wo Contrast  Result Date: 01/03/2018 CLINICAL DATA:  Altered level of consciousness, fell 2 days ago, abrasions to face, history hypertension and smoking, history subdural hematoma and surgery EXAM: CT HEAD WITHOUT CONTRAST TECHNIQUE: Contiguous axial images were obtained from the base of the skull through the vertex without intravenous contrast. Sagittal and coronal MPR images reconstructed from axial data set. COMPARISON:  01/02/2012 FINDINGS: Brain: Normal ventricular morphology. No midline shift or mass effect. 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 Otherwise normal appearance of brain parenchyma. No intracranial hemorrhage, mass lesion or evidence of acute infarction. No extra-axial fluid collections. Vascular: Unremarkable Skull: Prior LEFT frontotemporal parietal craniotomy. No acute osseous findings. 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 Prior LEFT craniotomy. No acute intracranial abnormalities. Electronically Signed   By: Ulyses Southward M.D.   On: 01/03/2018 14:32    EKG: Independently reviewed. Sinus tachycardia (HR 115) and left anterior fascicular block (similar to prior tracing from February 2019).  Assessment/Plan Principal Problem:   Multifocal pneumonia Active Problems:   Asthma exacerbation   Falls   Anemia   Hypokalemia   AKI (acute kidney injury) (HCC)   Hypertension   Lymphopenia   Multifocal pneumonia Tachycardic on arrival.  Not hypoxemic.  No leukocytosis. Lactic acid normal. Chest  x-ray showing consolidation in the right lung, more confluent in the upper lobe and more subtle areas of consolidation in the left lower thorax.  Findings consistent with likely multifocal pneumonia.   -Admit to telemetry -Continue Levaquin -DuoNebs every 6 hours -Supplemental oxygen as needed -Rule out influenza -Repeat EKG in a.m.  Acute exacerbation of chronic asthma Wheezing at present.  Exacerbation in the setting of pneumonia. -DuoNebs every 6 hours -Prednisone 40 mg daily  Recent falls Likely due to dehydration, deconditioning from pneumonia, and concomitant gabapentin and Flexeril use.  Head CT negative. -Hold sedating medications -IV fluid -Treatment for pneumonia as above  Lymphopenia  White blood cell count 2.8 and absolute lymphocyte count low at 0.5 at present; previously normal.  Possibly be due to a viral infection. -Repeat CBC in a.m. -Check for influenza -HIV antibody  Anemia Hemoglobin 11.2 with normal MCV; previously normal.  No signs of active bleeding. -Repeat CBC in a.m.  Hypokalemia Potassium 3.0.  Likely due to decreased oral intake in the setting of pneumonia. -Mag pending  -Replete orally -BMP in a.m.  Mild AKI Likely prerenal secondary to dehydration.  Creatinine 1.2; baseline 0.8-1.0. -IV fluid -BMP in a.m.  HTN Chronic normotensive.  No home medications listed. -Continue to monitor  Deconditioning -PT consult  Diet: Regular DVT prophylaxis: Lovenox Code Status: Patient wishes to be full code. Family Communication: Son at bedside has been updated. Disposition Plan: Anticipate discharge in 1 to 2 days to home. Consults called: None Admission status: Observation   John Giovanni MD [facility] Pager [phone]  If 7PM-7AM, please contact night-coverage [email or web address] Password [email or web address]  01/03/2018, 4:41 PM

## 2018-01-03 NOTE — ED Triage Notes (Addendum)
Pt fell Wednesday, denies remembering what happened.  Abrasion to the face and knees.   Pt's son brought in patient medications, gabapentin 300 mg and cyclobenzaprine 10 mg, both filled on 9/25.  Both bottles are empty.  Pt states son has to hide pills from her.  Pt's son states he is giving medication more often than prescribed.  Pt states "I dont remember if I found the pills when my son was gone and took them"  Pt's son states pt nodding off, dropping things and having a fever.

## 2018-01-03 NOTE — Progress Notes (Deleted)
Pt found wandering the halls, in room 333 when his room is 336. Pt moved to room 341 to be closer to the nurses station. Order for safety sitter put in. MD made aware.  

## 2018-01-03 NOTE — ED Provider Notes (Signed)
[facility] EMERGENCY DEPARTMENT Provider Note   CSN: [id number] Arrival date & time: 01/03/18  1253     History   Chief Complaint Chief Complaint  Patient presents with  . Fall    HPI Melanie [name] is a 52 y.o. female.  Patient complains of cough fever weakness and she is fallen twice recently  The history is provided by the patient. No language interpreter was used.  Weakness  Primary symptoms include no focal weakness. This is a new problem. The current episode started 12 to 24 hours ago. The problem has not changed since onset.There was no focality noted. The maximum temperature recorded prior to her arrival was 100 to 100.9 F. Associated symptoms include shortness of breath. Pertinent negatives include no chest pain and no headaches. There were no medications administered prior to arrival. Associated medical issues do not include trauma.    Past Medical History:  Diagnosis Date  . Asthma   . GERD (gastroesophageal reflux disease)   . Head injury, acute   . Hypertension   . Risk for falls   . Short-term memory loss     Patient Active Problem List   Diagnosis Date Noted  . Pneumococcal pneumonia (HCC) 11/29/2011  . Thrombocytopenia (HCC) 11/29/2011  . Acute respiratory failure (HCC) 11/24/2011  . Septic shock(785.52) 11/24/2011  . BIPOLAR DISORDER UNSPECIFIED 04/08/2009  . 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 PNEUMONIA 04/08/2009  . SEIZURE DISORDER 04/08/2009  . SUBSTANCE ABUSE, MULTIPLE 09/22/2008    Past Surgical History:  Procedure Laterality Date  . BRAIN SURGERY    . HERNIA REPAIR       OB History    Gravida  5   Para  2   Term  2   Preterm  0   AB  3   Living        SAB  3   TAB  0   Ectopic  0   Multiple      Live Births               Home Medications    Prior to Admission medications   Medication Sig Start Date End Date Taking? Authorizing Provider  albuterol (PROVENTIL HFA;VENTOLIN HFA) 108 (90 Base) MCG/ACT inhaler  Inhale 2 puffs into the lungs every 4 (four) hours as needed for wheezing or shortness of breath (or coughing). 05/14/17   Dione Booze, MD  HYDROcodone-acetaminophen [facility]) 5-325 MG tablet Take 2 tablets by mouth every 6 (six) hours as needed for moderate pain. Patient not taking: Reported on 11/23/2017 05/14/17   Dione Booze, MD  meloxicam (MOBIC) 7.5 MG tablet Take 2 tablets (15 mg total) by mouth daily. 11/23/17   Eber Hong, MD  methocarbamol (ROBAXIN) 500 MG tablet Take 1 tablet (500 mg total) by mouth every 8 (eight) hours as needed for muscle spasms. 11/23/17   Eber Hong, MD    Family History Family History  Problem Relation Age of Onset  . Hypertension Father   . Diabetes Father   . Hypertension Other   . Diabetes Other     Social History Social History   Tobacco Use  . Smoking status: Current Every Day Smoker    Packs/day: 1.00    Years: 35.00    Pack years: 35.00    Types: Cigarettes  . Smokeless tobacco: Never Used  Substance Use Topics  . Alcohol use: Yes    Comment: occas  . Drug use: No    Comment: pt snorts px meds.  Allergies   Patient has no known allergies.   Review of Systems Review of Systems  Constitutional: Negative for appetite change and fatigue.  HENT: Negative for congestion, ear discharge and sinus pressure.   Eyes: Negative for discharge.  Respiratory: Positive for shortness of breath. Negative for cough.   Cardiovascular: Negative for chest pain.  Gastrointestinal: Negative for abdominal pain and diarrhea.  Genitourinary: Negative for frequency and hematuria.  Musculoskeletal: Negative for back pain.  Skin: Negative for rash.  Neurological: Positive for weakness. Negative for focal weakness, seizures and headaches.  Psychiatric/Behavioral: Negative for hallucinations.     Physical Exam Updated Vital Signs BP (!) 103/53 (BP Location: Right Arm)   Pulse (!) 122   Temp 98.7 F (37.1 C) (Oral)   Resp 17   Ht 4\' 11"  (1.499  m)   Wt 59 kg   LMP 12/26/2011   SpO2 93%   BMI 26.27 kg/m   Physical Exam  Constitutional: She is oriented to person, place, and time. She appears well-developed.  HENT:  Head: Normocephalic.  Eyes: Conjunctivae and EOM are normal. No scleral icterus.  Neck: Neck supple. No thyromegaly present.  Cardiovascular: Normal rate and regular rhythm. Exam reveals no gallop and no friction rub.  No murmur heard. Pulmonary/Chest: No stridor. She has wheezes. She has rales. She exhibits no tenderness.  Abdominal: She exhibits no distension. There is no tenderness. There is no rebound.  Musculoskeletal: Normal range of motion. She exhibits no edema.  Lymphadenopathy:    She has no cervical adenopathy.  Neurological: She is oriented to person, place, and time. She exhibits normal muscle tone. Coordination normal.  Skin: No rash noted. No erythema.  Psychiatric: She has a normal mood and affect. Her behavior is normal.     ED Treatments / Results  Labs (all labs ordered are listed, but only abnormal results are displayed) Labs Reviewed  CBC WITH DIFFERENTIAL/PLATELET - Abnormal; Notable for the following components:      Result Value   WBC 2.8 (*)    RBC 3.67 (*)    Hemoglobin 11.2 (*)    HCT 33.1 (*)    Lymphs Abs 0.5 (*)    All other components within normal limits  COMPREHENSIVE METABOLIC PANEL - Abnormal; Notable for the following components:   Potassium 3.0 (*)    Glucose, Bld 116 (*)    BUN 30 (*)    Creatinine, Ser 1.27 (*)    Calcium 8.5 (*)    Albumin 3.2 (*)    GFR calc non Af Amer 47 (*)    GFR calc Af Amer 54 (*)    All other components within normal limits  LACTIC ACID, PLASMA    EKG None  Radiology Dg Chest 2 View  Result Date: 01/03/2018 CLINICAL DATA:  Cough congestion and wheezing.  Shortness of breath. EXAM: CHEST - 2 VIEW COMPARISON:  Chest radiograph 05/13/2017, chest CT 04/17/2017 FINDINGS: Cardiomediastinal silhouette is normal. Mediastinal contours  appear intact. There is no evidence of pleural effusion or pneumothorax. Interval development of patchy and nodular airspace consolidation throughout the right lung, more confluent in the upper lobe. Scattered areas of airspace consolidation in the left lower hemithorax. Osseous structures are without acute abnormality. Soft tissues are grossly normal. IMPRESSION: Interval development of patchy and nodular airspace consolidation throughout the right lung, more confluent in the upper lobe. More subtle areas of airspace consolidation the left lower thorax. In the acute clinical settings, this likely represents multifocal pneumonia, however follow-up to  resolution after empiric treatment should be considered. Electronically Signed   By: Ted Mcalpine M.D.   On: 01/03/2018 14:42   Ct Head Wo Contrast  Result Date: 01/03/2018 CLINICAL DATA:  Altered level of consciousness, fell 2 days ago, abrasions to face, history hypertension and smoking, history subdural hematoma and surgery EXAM: CT HEAD WITHOUT CONTRAST TECHNIQUE: Contiguous axial images were obtained from the base of the skull through the vertex without intravenous contrast. Sagittal and coronal MPR images reconstructed from axial data set. COMPARISON:  01/02/2012 FINDINGS: Brain: Normal ventricular morphology. No midline shift or mass effect. 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 Otherwise normal appearance of brain parenchyma. No intracranial hemorrhage, mass lesion or evidence of acute infarction. No extra-axial fluid collections. Vascular: Unremarkable Skull: Prior LEFT frontotemporal parietal craniotomy. No acute osseous findings. 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 Prior LEFT craniotomy. No acute intracranial abnormalities. Electronically Signed   By: Ulyses Southward M.D.   On: 01/03/2018 14:32    Procedures Procedures (including critical care  time)  Medications Ordered in ED Medications  levofloxacin (LEVAQUIN) IVPB 500 mg (has no administration in time range)  sodium chloride 0.9 % bolus 1,000 mL (1,000 mLs Intravenous New Bag/Given 01/03/18 1448)     Initial Impression / Assessment and Plan / ED Course  I have reviewed the triage vital signs and the nursing notes.  Pertinent labs & imaging results that were available during my care of the patient were reviewed by me and considered in my medical decision making (see chart for details).    Patient with bilateral pneumonia.  She will be admitted for IV antibiotics Final Clinical Impressions(s) / ED Diagnoses   Final diagnoses:  Community acquired pneumonia, unspecified laterality    ED Discharge Orders    None       Bethann Berkshire, MD 01/03/18 1523

## 2018-01-04 DIAGNOSIS — D72810 Lymphocytopenia: Secondary | ICD-10-CM

## 2018-01-04 DIAGNOSIS — J189 Pneumonia, unspecified organism: Secondary | ICD-10-CM

## 2018-01-04 DIAGNOSIS — N179 Acute kidney failure, unspecified: Secondary | ICD-10-CM | POA: Diagnosis not present

## 2018-01-04 DIAGNOSIS — D649 Anemia, unspecified: Secondary | ICD-10-CM | POA: Diagnosis not present

## 2018-01-04 DIAGNOSIS — W19XXXA Unspecified fall, initial encounter: Secondary | ICD-10-CM

## 2018-01-04 DIAGNOSIS — E876 Hypokalemia: Secondary | ICD-10-CM

## 2018-01-04 DIAGNOSIS — J45901 Unspecified asthma with (acute) exacerbation: Secondary | ICD-10-CM | POA: Diagnosis not present

## 2018-01-04 DIAGNOSIS — I10 Essential (primary) hypertension: Secondary | ICD-10-CM

## 2018-01-04 LAB — BASIC METABOLIC PANEL
Anion gap: 5 (ref 5–15)
BUN: 20 mg/dL (ref 6–20)
CHLORIDE: 113 mmol/L — AB (ref 98–111)
CO2: 22 mmol/L (ref 22–32)
Calcium: 7.7 mg/dL — ABNORMAL LOW (ref 8.9–10.3)
Creatinine, Ser: 0.9 mg/dL (ref 0.44–1.00)
GFR calc Af Amer: >60 mL/min (ref >60)
GFR calc non Af Amer: >60 mL/min (ref >60)
Glucose, Bld: 179 mg/dL — ABNORMAL HIGH (ref 70–99)
POTASSIUM: 3.5 mmol/L (ref 3.5–5.1)
SODIUM: 140 mmol/L (ref 135–145)

## 2018-01-04 LAB — CBC
HEMATOCRIT: 27.6 % — AB (ref 36.0–46.0)
HEMOGLOBIN: 9 g/dL — AB (ref 12.0–15.0)
MCH: 30.1 pg (ref 26.0–34.0)
MCHC: 32.6 g/dL (ref 30.0–36.0)
MCV: 92.3 fL (ref 78.0–100.0)
Platelets: 155 10*3/uL (ref 150–400)
RBC: 2.99 MIL/uL — AB (ref 3.87–5.11)
RDW: 14.1 % (ref 11.5–15.5)
WBC: 2.1 10*3/uL — ABNORMAL LOW (ref 4.0–10.5)

## 2018-01-04 LAB — HIV ANTIBODY (ROUTINE TESTING W REFLEX): HIV SCREEN 4TH GENERATION: NONREACTIVE

## 2018-01-04 LAB — INFLUENZA PANEL BY PCR (TYPE A & B)
Influenza A By PCR: NEGATIVE
Influenza B By PCR: NEGATIVE

## 2018-01-04 MED ORDER — TRAMADOL HCL 50 MG PO TABS
50.0000 mg | ORAL_TABLET | Freq: Four times a day (QID) | ORAL | Status: DC | PRN
  Administered 2018-01-04 – 2018-01-05 (×2): 50 mg via ORAL
  Filled 2018-01-04 (×2): qty 1

## 2018-01-04 MED ORDER — SODIUM CHLORIDE 0.9 % IV BOLUS (SEPSIS)
1000.0000 mL | Freq: Once | INTRAVENOUS | Status: AC
  Administered 2018-01-04: 1000 mL via INTRAVENOUS

## 2018-01-04 MED ORDER — ALUM & MAG HYDROXIDE-SIMETH 200-200-20 MG/5ML PO SUSP
30.0000 mL | Freq: Four times a day (QID) | ORAL | Status: DC | PRN
  Administered 2018-01-04: 30 mL via ORAL
  Filled 2018-01-04: qty 30

## 2018-01-04 MED ORDER — ZOLPIDEM TARTRATE 5 MG PO TABS
5.0000 mg | ORAL_TABLET | Freq: Every evening | ORAL | Status: DC | PRN
  Administered 2018-01-04: 5 mg via ORAL
  Filled 2018-01-04: qty 1

## 2018-01-04 NOTE — Evaluation (Signed)
Physical Therapy Evaluation Patient Details Name: Melanie [name] MRN: [id number] DOB: January 15, 1966 Today's Date: 01/04/2018   History of Present Illness  Melanie [name] is a 52 y.o. female with medical history significant of hypertension, asthma, GERD, previous head injury presenting to the hospital with a chief complaint of cough, shortness of breath, generalized weakness, and falls.  Patient states she has been having fevers, chills, and body aches for the past 5 days and symptoms have been getting progressively worse.  She has been coughing up light brown-colored sputum.  She has been feeling weak and tired.  She is not sure if she had any chest pain.  Reports having a poor appetite.  Reports having nonbloody emesis several times since yesterday.  States she is still nauseous but wants to eat potato wedges.  She did not receive a flu shot this year.  Son at bedside states patient has been disoriented and has had several falls in the past few days.  States she has been unsteady and dropping objects for the last few years but lately symptoms have been worse.  He does not believe patient ever lost consciousness during any of these falls.  Clinical Impression  Pt received in bed and was agreeable to PT evaluation. Pt from home with son where she reports she is mainly a HH ambulator and uses furniture to assist with mobility and is a limited community ambulator with assistance from carts, etc. Pt stating she has had falls in the past but doesn't know how many stating she has short term memory from her head injury in the past. Currently, pt mod I bed mobility and supervision for transfers and gait with RW. RN took pt off O2 with PT in the room. Pt reporting SOB and dizziness multiple times during session, however, O2 sats and HR WNL at 99% on RA and 54bpm after activity. Pt limiting her session this date stating she couldn't do anymore after amb to bathroom and back to bed due to the dizziness and SOB. PT  recommending HHPT services to address pt's BLE weakness, balance, and gait in order to decrease her risk for falls, however, pt became agitated when recommendation given to her and stated that she did not want HHPT coming to her house for undisclosed reasons.      Follow Up Recommendations Home health PT;Supervision/Assistance - 24 hour    Equipment Recommendations  None recommended by PT    Recommendations for Other Services       Precautions / Restrictions Precautions Precautions: Fall Precaution Comments: has had multiple falls in the recent past but she states that she doesn't know how many due to her short term memory issues from her head injury.       Mobility  Bed Mobility Overal bed mobility: Modified Independent                Transfers Overall transfer level: Needs assistance Equipment used: Rolling walker (2 wheeled) Transfers: Sit to/from Stand Sit to Stand: Supervision         General transfer comment: RW  Ambulation/Gait Ambulation/Gait assistance: Supervision Gait Distance (Feet): 15 Feet Assistive device: Rolling walker (2 wheeled) Gait Pattern/deviations: Step-through pattern;Decreased stride length     General Gait Details: limited to amb in room from bed > toilet > sink > bed due to c/o dizzniess and BLE fatigue; she was slightly unsteady, however, no overt LOB noted  Careers information officer  Modified Rankin (Stroke Patients Only)       Balance Overall balance assessment: Needs assistance Sitting-balance support: Feet supported Sitting balance-Leahy Scale: Good     Standing balance support: Bilateral upper extremity supported Standing balance-Leahy Scale: Fair Standing balance comment: fair with RW                             Pertinent Vitals/Pain      Home Living Family/patient expects to be discharged to:: Private residence Living Arrangements: Children Available Help at Discharge:  Family Type of Home: Mobile home Home Access: Stairs to enter Entrance Stairs-Rails: Right Entrance Stairs-Number of Steps: 5-6 at front door, 2 steps at back door and uses back door the most Home Layout: One level Home Equipment: Walker - 2 wheels      Prior Function Level of Independence: Independent         Comments: pt states that she furniture walks inside but doesn't get out in the community much; when she does she tries to use the cart; states that she did fine walking prior to getting pneumonia     Hand Dominance   Dominant Hand: Right    Extremity/Trunk Assessment   Upper Extremity Assessment Upper Extremity Assessment: Overall WFL for tasks assessed    Lower Extremity Assessment Lower Extremity Assessment: Generalized weakness    Cervical / Trunk Assessment Cervical / Trunk Assessment: Normal  Communication   Communication: No difficulties  Cognition Arousal/Alertness: Awake/alert Behavior During Therapy: WFL for tasks assessed/performed Overall Cognitive Status: Within Functional Limits for tasks assessed                                        General Comments      Exercises     Assessment/Plan    PT Assessment Patient needs continued PT services  PT Problem List Decreased strength;Decreased activity tolerance;Decreased balance;Decreased safety awareness       PT Treatment Interventions DME instruction;Gait training;Stair training;Therapeutic activities;Therapeutic exercise;Patient/family education    PT Goals (Current goals can be found in the Care Plan section)  Acute Rehab PT Goals Patient Stated Goal: home PT Goal Formulation: With patient/family Time For Goal Achievement: 01/11/18 Potential to Achieve Goals: Good    Frequency Min 2X/week   Barriers to discharge        Co-evaluation               AM-PAC PT "6 Clicks" Daily Activity  Outcome Measure Difficulty turning over in bed (including adjusting  bedclothes, sheets and blankets)?: None Difficulty moving from lying on back to sitting on the side of the bed? : None Difficulty sitting down on and standing up from a chair with arms (e.g., wheelchair, bedside commode, etc,.)?: A Little Help needed moving to and from a bed to chair (including a wheelchair)?: A Little Help needed walking in hospital room?: A Little Help needed climbing 3-5 steps with a railing? : A Lot 6 Click Score: 19    End of Session Equipment Utilized During Treatment: Gait belt Activity Tolerance: Patient tolerated treatment well;No increased pain Patient left: in bed;with call bell/phone within reach;with family/visitor present Nurse Communication: Mobility status PT Visit Diagnosis: Unsteadiness on feet (R26.81);Repeated falls (R29.6);History of falling (Z91.81);Muscle weakness (generalized) (M62.81);Difficulty in walking, not elsewhere classified (R26.2)    Time: 1200-1220 PT Time Calculation (min) (ACUTE ONLY): 20 min  Charges:   PT Evaluation $PT Eval Low Complexity: 1 Low            Jac Canavan PT, DPT

## 2018-01-04 NOTE — Discharge Summary (Addendum)
Physician Discharge Summary  Melanie [name] ZOX:[id number] DOB: [birth year] DOA: 01/03/2018  PCP: Tanna Furry, MD  Admit date: 01/03/2018 Discharge date: 01/05/2018  Time spent: 45 minutes  Recommendations for Outpatient Follow-up:  Patient will be discharged to home.  Patient will need to follow up with primary care provider within one week of discharge, repeat CBC and BMP.  Patient should continue medications as prescribed.  Patient should follow a heart healthy diet.   Discharge Diagnoses:  Severe sepsis secondary to multifocal pneumonia  Acute Asthma exacerbation AKI Recent falls/Physical deconditioning  Leukopenia/lymphopenia Essential hypertension Hypokalemia Normocytic anemia  Discharge Condition: Stable  Diet recommendation: Heart healthy  Filed Weights   01/03/18 1302 01/03/18 1752  Weight: 59 kg 59.7 kg    History of present illness:  On 01/03/2018 by Dr. John Giovanni Melanie [name] is a 52 y.o. female with medical history significant of hypertension, asthma, GERD, previous head injury presenting to the hospital with a chief complaint of cough, shortness of breath, generalized weakness, and falls.  Patient states she has been having fevers, chills, and body aches for the past 5 days and symptoms have been getting progressively worse.  She has been coughing up light brown-colored sputum.  She has been feeling weak and tired.  She is not sure if she had any chest pain.  Reports having a poor appetite.  Reports having nonbloody emesis several times since yesterday.  States she is still nauseous but wants to eat potato wedges.  She did not receive a flu shot this year.  Son at bedside states patient has been disoriented and has had several falls in the past few days.  States she has been unsteady and dropping objects for the last few years but lately symptoms have been worse.  He does not believe patient ever lost consciousness during any of these falls.  Per  ED nursing staff, patient had 2 empty bottles with her (gabapentin and Flexeril) which were filled by the pharmacy on December 24, 2017.  Per patient, she has been taking gabapentin and Flexeril but son at bedside denied.  Patient stated she takes these medications to help her sleep.  It was difficult to obtain any further history because patient and son had an argument when I was present in the room.  They both had conflicting histories when it came to gabapentin and Flexeril use.  Hospital Course:  Severe sepsis secondary to multifocal pneumonia  -Patient presented with leukopenia, tachycardic, tachypnea with AKI and hypotension -CXR development of patchy nodular airspace consolidation throughout the right lung, more subtle areas of airspace consolidation left lower thorax, representing multifocal pneumonia -influenza PCR negative -placed on levaquin -no blood cultures ordered on admission  Acute Asthma exacerbation -continue nebs and prednisone   AKI -creatinine on admission 1.2, currently down to 0.7 -baseline eariler this year about 0.8  Recent falls/Physical deconditioning  -Possibly due to infection, deconditioning, medication use -CT head unremarkable for acute changes -PT consulted recommended home health.  However it seems that patient did not want home health coming to her house for undisclosed reasons as per PT note.  Patient also stated she did not want to work more with PT after ambulating to the bathroom due to feeling bad.  Leukopenia/lymphopenia -Suspect secondary to infection, ?viral cause -WBC improved to 3.7  Essential hypertension -patient was hypotensive in the ED; currently normotensive and on no home medications  Hypokalemia -Potassium 4.1 -repeat BMP in one   Normocytic anemia -hemoglobin currently 10.6 -Repeat CBC in  one week  Procedures: None  Consultations: None  Discharge Exam: Vitals:   01/05/18 1346 01/05/18 1437  BP: 118/76   Pulse: 88     Resp: 20   Temp:    SpO2: 99% 97%     General: Well developed, well nourished, NAD, appears stated age  HEENT: NCAT, mucous membranes moist. Poor dentition  Neck: Supple  Cardiovascular: S1 S2 auscultated, RRR, no murmurs  Respiratory: Diminished breath sounds  Abdomen: Soft, nontender, nondistended, + bowel sounds  Extremities: warm dry without cyanosis clubbing or edema  Neuro: AAOx3, nonfocal  Psych: Appropriate mood and affect  Discharge Instructions Discharge Instructions    Discharge instructions   Complete by:  As directed    Patient will be discharged to home.  Patient will need to follow up with primary care provider within one week of discharge, repeat CBC and BMP.  Patient should continue medications as prescribed.  Patient should follow a heart healthy diet.     Allergies as of 01/05/2018   No Known Allergies     Medication List    STOP taking these medications   meloxicam 7.5 MG tablet Commonly known as:  MOBIC   methocarbamol 500 MG tablet Commonly known as:  ROBAXIN     TAKE these medications   albuterol 108 (90 Base) MCG/ACT inhaler Commonly known as:  PROVENTIL HFA;VENTOLIN HFA Inhale 2 puffs into the lungs every 4 (four) hours as needed for wheezing or shortness of breath (or coughing).   levofloxacin 500 MG tablet Commonly known as:  LEVAQUIN Take 1 tablet (500 mg total) by mouth daily. Start taking on:  01/06/2018   predniSONE 10 MG tablet Commonly known as:  DELTASONE Take 4 tabs x 1day, then 3 tabs x 3days, then 2 tabs x 3days, then 1 tab x 3days      No Known Allergies Follow-up Information    Zhou-Talbert, Ralene Bathe, MD. Schedule an appointment as soon as possible for a visit in 1 week(s).   Specialty:  Family Medicine Why:  Hospital follow up Contact information: [address] [place] [address] [phone]            The results of significant diagnostics from this hospitalization (including imaging,  microbiology, ancillary and laboratory) are listed below for reference.    Significant Diagnostic Studies: Dg Chest 2 View  Result Date: 01/03/2018 CLINICAL DATA:  Cough congestion and wheezing.  Shortness of breath. EXAM: CHEST - 2 VIEW COMPARISON:  Chest radiograph 05/13/2017, chest CT 04/17/2017 FINDINGS: Cardiomediastinal silhouette is normal. Mediastinal contours appear intact. There is no evidence of pleural effusion or pneumothorax. Interval development of patchy and nodular airspace consolidation throughout the right lung, more confluent in the upper lobe. Scattered areas of airspace consolidation in the left lower hemithorax. Osseous structures are without acute abnormality. Soft tissues are grossly normal. IMPRESSION: Interval development of patchy and nodular airspace consolidation throughout the right lung, more confluent in the upper lobe. More subtle areas of airspace consolidation the left lower thorax. In the acute clinical settings, this likely represents multifocal pneumonia, however follow-up to resolution after empiric treatment should be considered. Electronically Signed   By: Ted Mcalpine M.D.   On: 01/03/2018 14:42   Ct Head Wo Contrast  Result Date: 01/03/2018 CLINICAL DATA:  Altered level of consciousness, fell 2 days ago, abrasions to face, history hypertension and smoking, history subdural hematoma and surgery EXAM: CT HEAD WITHOUT CONTRAST TECHNIQUE: Contiguous axial images were obtained from the base of  the skull through the vertex without intravenous contrast. Sagittal and coronal MPR images reconstructed from axial data set. COMPARISON:  01/02/2012 FINDINGS: Brain: Normal ventricular morphology. No midline shift or mass effect. 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 Otherwise normal appearance of brain parenchyma. No intracranial hemorrhage, mass lesion or evidence of acute infarction. No extra-axial fluid collections. Vascular: Unremarkable Skull: Prior LEFT  frontotemporal parietal craniotomy. No acute osseous findings. 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 Prior LEFT craniotomy. No acute intracranial abnormalities. Electronically Signed   By: Ulyses Southward M.D.   On: 01/03/2018 14:32    Microbiology: No results found for this or any previous visit (from the past 240 hour(s)).   Labs: Basic Metabolic Panel: Recent Labs  Lab 01/03/18 1401 01/04/18 0619 01/05/18 0523 01/05/18 1456  NA 136 140 141  --   K 3.0* 3.5 2.6* 4.1  CL 105 113* 112*  --   CO2 23 22 20*  --   GLUCOSE 116* 179* 138*  --   BUN 30* 20 14  --   CREATININE 1.27* 0.90 0.70  --   CALCIUM 8.5* 7.7* 8.3*  --   MG 1.9  --  2.0  --    Liver Function Tests: Recent Labs  Lab 01/03/18 1401  AST 27  ALT 20  ALKPHOS 62  BILITOT 0.8  PROT 6.8  ALBUMIN 3.2*   No results for input(s): LIPASE, AMYLASE in the last 168 hours. No results for input(s): AMMONIA in the last 168 hours. CBC: Recent Labs  Lab 01/03/18 1401 01/04/18 0619 01/05/18 0523  WBC 2.8* 2.1* 3.7*  NEUTROABS 2.1  --   --   HGB 11.2* 9.0* 10.6*  HCT 33.1* 27.6* 32.0*  MCV 90.2 92.3 91.7  PLT 181 155 186   Cardiac Enzymes: No results for input(s): CKTOTAL, CKMB, CKMBINDEX, TROPONINI in the last 168 hours. BNP: BNP (last 3 results) No results for input(s): BNP in the last 8760 hours.  ProBNP (last 3 results) No results for input(s): PROBNP in the last 8760 hours.  CBG: No results for input(s): GLUCAP in the last 168 hours.     Signed:  Edsel Petrin  [facility] 01/05/2018, 3:57 PM

## 2018-01-04 NOTE — Progress Notes (Signed)
PROGRESS NOTE    Melanie [name]  ZOX:[id number] DOB: [birth year] DOA: 01/03/2018 PCP: Tanna Furry, MD   Brief Narrative:  Admitted for pneumonia.  Assessment & Plan   Severe sepsis secondary to multifocal pneumonia  -Patient presented with leukopenia, tachycardic, tachypnea with AKI and hypotension -CXR development of patchy nodular airspace consolidation throughout the right lung, more subtle areas of airspace consolidation left lower thorax, representing multifocal pneumonia -influenza PCR negative -placed on levaquin -no blood cultures ordered on admission  Acute Asthma exacerbation -continue nebs and prednisone   AKI -creatinine on admission 1.2, currently down to 0.9  -baseline eariler this year about 0.8  Recent falls/Physical deconditioning  -Possibly due to infection, deconditioning, medication use -CT head unremarkable for acute changes -PT consulted recommended home health.  However it seems that patient did not want home health coming to her house for undisclosed reasons as per PT note.  Patient also stated she did not want to work more with PT after ambulating to the bathroom due to feeling bad.  Leukopenia/lymphopenia -Suspect secondary to infection, ?viral cause -Continue to monitor  Essential hypertension -patient was hypotensive in the ED; currently normotensive and on no home medications  Hypokalemia -resolved with replacement -Continue to monitor BMP  Normocytic anemia -hemoglobin currently 9, however patient received IVF therefor likely dilutional component -upon review of patient's chart, hemoglobin was normal earlier this year however lower in 2013 -Continue to monitor CBC  DVT Prophylaxis  lovenox  Code Status: Full  Family Communication: None at bedside  Disposition Plan: observation, suspect home when medically improved  Consultants None  Procedures  None  Antibiotics   Anti-infectives (From admission, onward)   Start      Dose/Rate Route Frequency Ordered Stop   01/04/18 1500  levofloxacin (LEVAQUIN) tablet 500 mg     500 mg Oral Daily 01/03/18 1753     01/03/18 1515  levofloxacin (LEVAQUIN) IVPB 500 mg     500 mg 100 mL/hr over 60 Minutes Intravenous  Once 01/03/18 1514 01/03/18 1729      Subjective:   Derrill Memo seen and examined today.  Wants tylenol#3 for her pain and headache. She complains of back pain. Denies current chest pain. Has cough. Denies current shortness of breath. Denies current abdominal pain, N/V/D/C.   Objective:   Vitals:   01/04/18 0135 01/04/18 0544 01/04/18 0558 01/04/18 0649  BP:  (!) 68/48 (!) 78/59 90/69  Pulse:  82 80   Resp:  16    Temp:  98.5 F (36.9 C)    TempSrc:      SpO2: 96% 99%    Weight:      Height:        Intake/Output Summary (Last 24 hours) at 01/04/2018 1126 Last data filed at 01/04/2018 0900 Gross per 24 hour  Intake 2509.58 ml  Output 800 ml  Net 1709.58 ml   Filed Weights   01/03/18 1302 01/03/18 1752  Weight: 59 kg 59.7 kg    Exam  General: Well developed, well nourished, NAD, appears stated age  HEENT: NCAT, mucous membranes moist. Poor dentition  Neck: Supple, no JVD, no masses  Cardiovascular: S1 S2 auscultated, RRR, no murmurs  Respiratory: Diminished breath sounds  Abdomen: Soft, nontender, nondistended, + bowel sounds  Extremities: warm dry without cyanosis clubbing or edema  Neuro: AAOx3, nonfocal  Psych: anxious    Data Reviewed: I have personally reviewed following labs and imaging studies  CBC: Recent Labs  Lab 01/03/18 1401 01/04/18 8119  WBC 2.8* 2.1*  NEUTROABS 2.1  --   HGB 11.2* 9.0*  HCT 33.1* 27.6*  MCV 90.2 92.3  PLT 181 155   Basic Metabolic Panel: Recent Labs  Lab 01/03/18 1401 01/04/18 0619  NA 136 140  K 3.0* 3.5  CL 105 113*  CO2 23 22  GLUCOSE 116* 179*  BUN 30* 20  CREATININE 1.27* 0.90  CALCIUM 8.5* 7.7*  MG 1.9  --    GFR: Estimated Creatinine Clearance: 56.2 mL/min  (by C-G formula based on SCr of 0.9 mg/dL). Liver Function Tests: Recent Labs  Lab 01/03/18 1401  AST 27  ALT 20  ALKPHOS 62  BILITOT 0.8  PROT 6.8  ALBUMIN 3.2*   No results for input(s): LIPASE, AMYLASE in the last 168 hours. No results for input(s): AMMONIA in the last 168 hours. Coagulation Profile: No results for input(s): INR, PROTIME in the last 168 hours. Cardiac Enzymes: No results for input(s): CKTOTAL, CKMB, CKMBINDEX, TROPONINI in the last 168 hours. BNP (last 3 results) No results for input(s): PROBNP in the last 8760 hours. HbA1C: No results for input(s): HGBA1C in the last 72 hours. CBG: No results for input(s): GLUCAP in the last 168 hours. Lipid Profile: No results for input(s): CHOL, HDL, LDLCALC, TRIG, CHOLHDL, LDLDIRECT in the last 72 hours. Thyroid Function Tests: No results for input(s): TSH, T4TOTAL, FREET4, T3FREE, THYROIDAB in the last 72 hours. Anemia Panel: No results for input(s): VITAMINB12, FOLATE, FERRITIN, TIBC, IRON, RETICCTPCT in the last 72 hours. Urine analysis:    Component Value Date/Time   COLORURINE STRAW (A) 11/23/2017 1346   APPEARANCEUR CLEAR 11/23/2017 1346   LABSPEC 1.001 (L) 11/23/2017 1346   PHURINE 8.0 11/23/2017 1346   GLUCOSEU NEGATIVE 11/23/2017 1346   HGBUR SMALL (A) 11/23/2017 1346   BILIRUBINUR NEGATIVE 11/23/2017 1346   KETONESUR NEGATIVE 11/23/2017 1346   PROTEINUR NEGATIVE 11/23/2017 1346   UROBILINOGEN 0.2 01/02/2012 1615   NITRITE NEGATIVE 11/23/2017 1346   LEUKOCYTESUR NEGATIVE 11/23/2017 1346   Sepsis Labs: @LABRCNTIP (procalcitonin:4,lacticidven:4)  )No results found for this or any previous visit (from the past 240 hour(s)).    Radiology Studies: Dg Chest 2 View  Result Date: 01/03/2018 CLINICAL DATA:  Cough congestion and wheezing.  Shortness of breath. EXAM: CHEST - 2 VIEW COMPARISON:  Chest radiograph 05/13/2017, chest CT 04/17/2017 FINDINGS: Cardiomediastinal silhouette is normal. Mediastinal  contours appear intact. There is no evidence of pleural effusion or pneumothorax. Interval development of patchy and nodular airspace consolidation throughout the right lung, more confluent in the upper lobe. Scattered areas of airspace consolidation in the left lower hemithorax. Osseous structures are without acute abnormality. Soft tissues are grossly normal. IMPRESSION: Interval development of patchy and nodular airspace consolidation throughout the right lung, more confluent in the upper lobe. More subtle areas of airspace consolidation the left lower thorax. In the acute clinical settings, this likely represents multifocal pneumonia, however follow-up to resolution after empiric treatment should be considered. Electronically Signed   By: Ted Mcalpine M.D.   On: 01/03/2018 14:42   Ct Head Wo Contrast  Result Date: 01/03/2018 CLINICAL DATA:  Altered level of consciousness, fell 2 days ago, abrasions to face, history hypertension and smoking, history subdural hematoma and surgery EXAM: CT HEAD WITHOUT CONTRAST TECHNIQUE: Contiguous axial images were obtained from the base of the skull through the vertex without intravenous contrast. Sagittal and coronal MPR images reconstructed from axial data set. COMPARISON:  01/02/2012 FINDINGS: Brain: Normal ventricular morphology. No midline shift or mass effect.  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 Otherwise normal appearance of brain parenchyma. No intracranial hemorrhage, mass lesion or evidence of acute infarction. No extra-axial fluid collections. Vascular: Unremarkable Skull: Prior LEFT frontotemporal parietal craniotomy. No acute osseous findings. 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 Prior LEFT craniotomy. No acute intracranial abnormalities. Electronically Signed   By: Ulyses Southward M.D.   On: 01/03/2018 14:32     Scheduled Meds: . enoxaparin (LOVENOX)  injection  40 mg Subcutaneous Q24H  . ipratropium-albuterol  3 mL Nebulization Q6H  . levofloxacin  500 mg Oral Daily  . lidocaine  1 patch Transdermal Q24H  . predniSONE  40 mg Oral Q breakfast   Continuous Infusions: . sodium chloride 100 mL/hr at 01/04/18 0313     LOS: 0 days   Time Spent in minutes   30 minutes  Yuliana Vandrunen D.O. on 01/04/2018 at 11:26 AM  Between 7am to 7pm - Please see pager noted on [email or web address]  After 7pm go to [email or web address]  And look for the night coverage person covering for me after hours  [facility] Office  [phone]

## 2018-01-04 NOTE — Plan of Care (Signed)
  Problem: Acute Rehab PT Goals(only PT should resolve) Goal: Pt Will Go Sit To Supine/Side Flowsheets (Taken 01/04/2018 1234) Pt will go Sit to Supine/Side: with modified independence Goal: Patient Will Transfer Sit To/From Stand Flowsheets (Taken 01/04/2018 1234) Patient will transfer sit to/from stand: with modified independence Goal: Pt Will Transfer Bed To Chair/Chair To Bed Flowsheets (Taken 01/04/2018 1234) Pt will Transfer Bed to Chair/Chair to Bed: with modified independence Goal: Pt Will Ambulate Flowsheets (Taken 01/04/2018 1234) Pt will Ambulate: 50 feet; with modified independence; with supervision; with least restrictive assistive device    Jac Canavan PT, DPT

## 2018-01-05 DIAGNOSIS — J45901 Unspecified asthma with (acute) exacerbation: Secondary | ICD-10-CM | POA: Diagnosis not present

## 2018-01-05 DIAGNOSIS — D649 Anemia, unspecified: Secondary | ICD-10-CM | POA: Diagnosis not present

## 2018-01-05 DIAGNOSIS — J189 Pneumonia, unspecified organism: Secondary | ICD-10-CM | POA: Diagnosis not present

## 2018-01-05 DIAGNOSIS — N179 Acute kidney failure, unspecified: Secondary | ICD-10-CM | POA: Diagnosis not present

## 2018-01-05 LAB — BASIC METABOLIC PANEL
ANION GAP: 9 (ref 5–15)
BUN: 14 mg/dL (ref 6–20)
CALCIUM: 8.3 mg/dL — AB (ref 8.9–10.3)
CHLORIDE: 112 mmol/L — AB (ref 98–111)
CO2: 20 mmol/L — AB (ref 22–32)
Creatinine, Ser: 0.7 mg/dL (ref 0.44–1.00)
GFR calc non Af Amer: >60 mL/min (ref >60)
GLUCOSE: 138 mg/dL — AB (ref 70–99)
Potassium: 2.6 mmol/L — CL (ref 3.5–5.1)
SODIUM: 141 mmol/L (ref 135–145)

## 2018-01-05 LAB — POTASSIUM: POTASSIUM: 4.1 mmol/L (ref 3.5–5.1)

## 2018-01-05 LAB — CBC
HCT: 32 % — ABNORMAL LOW (ref 36.0–46.0)
HEMOGLOBIN: 10.6 g/dL — AB (ref 12.0–15.0)
MCH: 30.4 pg (ref 26.0–34.0)
MCHC: 33.1 g/dL (ref 30.0–36.0)
MCV: 91.7 fL (ref 78.0–100.0)
PLATELETS: 186 10*3/uL (ref 150–400)
RBC: 3.49 MIL/uL — AB (ref 3.87–5.11)
RDW: 14.3 % (ref 11.5–15.5)
WBC: 3.7 10*3/uL — AB (ref 4.0–10.5)

## 2018-01-05 LAB — MAGNESIUM: MAGNESIUM: 2 mg/dL (ref 1.7–2.4)

## 2018-01-05 MED ORDER — POTASSIUM CHLORIDE 10 MEQ/100ML IV SOLN
10.0000 meq | INTRAVENOUS | Status: DC
  Administered 2018-01-05: 10 meq via INTRAVENOUS
  Filled 2018-01-05: qty 100

## 2018-01-05 MED ORDER — MAGNESIUM SULFATE 2 GM/50ML IV SOLN
2.0000 g | Freq: Once | INTRAVENOUS | Status: DC
  Administered 2018-01-05: 2 g via INTRAVENOUS
  Filled 2018-01-05: qty 50

## 2018-01-05 MED ORDER — PREDNISONE 10 MG PO TABS: ORAL_TABLET | ORAL | 0 refills | Status: DC

## 2018-01-05 MED ORDER — POTASSIUM CHLORIDE CRYS ER 20 MEQ PO TBCR
60.0000 meq | EXTENDED_RELEASE_TABLET | ORAL | Status: AC
  Administered 2018-01-05 (×2): 60 meq via ORAL
  Filled 2018-01-05 (×2): qty 3

## 2018-01-05 MED ORDER — LEVOFLOXACIN 500 MG PO TABS: 500.0000 mg | ORAL_TABLET | Freq: Every day | ORAL | 0 refills | Status: DC

## 2018-01-05 MED ORDER — POTASSIUM CHLORIDE CRYS ER 20 MEQ PO TBCR
40.0000 meq | EXTENDED_RELEASE_TABLET | Freq: Once | ORAL | Status: AC
  Administered 2018-01-05: 40 meq via ORAL
  Filled 2018-01-05: qty 2

## 2018-01-05 MED ORDER — POTASSIUM CHLORIDE CRYS ER 20 MEQ PO TBCR
20.0000 meq | EXTENDED_RELEASE_TABLET | Freq: Once | ORAL | Status: AC
  Administered 2018-01-05: 20 meq via ORAL
  Filled 2018-01-05: qty 1

## 2018-01-05 MED ORDER — POTASSIUM CHLORIDE CRYS ER 20 MEQ PO TBCR: 40.0000 meq | EXTENDED_RELEASE_TABLET | ORAL | Status: DC

## 2018-01-05 NOTE — Discharge Instructions (Signed)

## 2018-01-05 NOTE — Progress Notes (Signed)
CRITICAL VALUE ALERT  Critical Value:  Potassium 2.6  Date & Time Notied:  01/05/18 0645  Provider Notified: Dr. Antionette Char   Orders Received/Actions taken:

## 2018-01-05 NOTE — Progress Notes (Signed)
Patient states understanding of discharge instructions.  

## 2018-01-06 ENCOUNTER — Inpatient Hospital Stay (HOSPITAL_COMMUNITY)
Admission: EM | Admit: 2018-01-06 | Discharge: 2018-01-14 | DRG: 871 | Disposition: A | Discharge status: Home Health | Payer: Medicaid Other | Attending: Family Medicine | Admitting: Family Medicine

## 2018-01-06 ENCOUNTER — Emergency Department (HOSPITAL_COMMUNITY): Payer: Medicaid Other

## 2018-01-06 ENCOUNTER — Other Ambulatory Visit: Payer: Self-pay

## 2018-01-06 ENCOUNTER — Encounter (HOSPITAL_COMMUNITY): Payer: Self-pay | Admitting: Emergency Medicine

## 2018-01-06 DIAGNOSIS — D649 Anemia, unspecified: Secondary | ICD-10-CM | POA: Diagnosis present

## 2018-01-06 DIAGNOSIS — Z79899 Other long term (current) drug therapy: Secondary | ICD-10-CM | POA: Diagnosis not present

## 2018-01-06 DIAGNOSIS — J189 Pneumonia, unspecified organism: Secondary | ICD-10-CM | POA: Diagnosis present

## 2018-01-06 DIAGNOSIS — Z720 Tobacco use: Secondary | ICD-10-CM | POA: Diagnosis not present

## 2018-01-06 DIAGNOSIS — R0902 Hypoxemia: Secondary | ICD-10-CM

## 2018-01-06 DIAGNOSIS — J181 Lobar pneumonia, unspecified organism: Secondary | ICD-10-CM | POA: Diagnosis present

## 2018-01-06 DIAGNOSIS — Z833 Family history of diabetes mellitus: Secondary | ICD-10-CM

## 2018-01-06 DIAGNOSIS — F17210 Nicotine dependence, cigarettes, uncomplicated: Secondary | ICD-10-CM | POA: Diagnosis present

## 2018-01-06 DIAGNOSIS — Y95 Nosocomial condition: Secondary | ICD-10-CM | POA: Diagnosis present

## 2018-01-06 DIAGNOSIS — E119 Type 2 diabetes mellitus without complications: Secondary | ICD-10-CM | POA: Diagnosis present

## 2018-01-06 DIAGNOSIS — J45901 Unspecified asthma with (acute) exacerbation: Secondary | ICD-10-CM | POA: Diagnosis present

## 2018-01-06 DIAGNOSIS — Z8249 Family history of ischemic heart disease and other diseases of the circulatory system: Secondary | ICD-10-CM | POA: Diagnosis not present

## 2018-01-06 DIAGNOSIS — R9431 Abnormal electrocardiogram [ECG] [EKG]: Secondary | ICD-10-CM | POA: Diagnosis present

## 2018-01-06 DIAGNOSIS — R7881 Bacteremia: Secondary | ICD-10-CM | POA: Diagnosis present

## 2018-01-06 DIAGNOSIS — A419 Sepsis, unspecified organism: Secondary | ICD-10-CM

## 2018-01-06 DIAGNOSIS — R000 Tachycardia, unspecified: Secondary | ICD-10-CM | POA: Diagnosis not present

## 2018-01-06 DIAGNOSIS — J9601 Acute respiratory failure with hypoxia: Secondary | ICD-10-CM | POA: Diagnosis not present

## 2018-01-06 DIAGNOSIS — K219 Gastro-esophageal reflux disease without esophagitis: Secondary | ICD-10-CM | POA: Diagnosis not present

## 2018-01-06 DIAGNOSIS — I10 Essential (primary) hypertension: Secondary | ICD-10-CM | POA: Diagnosis present

## 2018-01-06 DIAGNOSIS — A4101 Sepsis due to Methicillin susceptible Staphylococcus aureus: Principal | ICD-10-CM | POA: Diagnosis present

## 2018-01-06 DIAGNOSIS — B9561 Methicillin susceptible Staphylococcus aureus infection as the cause of diseases classified elsewhere: Secondary | ICD-10-CM | POA: Diagnosis present

## 2018-01-06 DIAGNOSIS — I340 Nonrheumatic mitral (valve) insufficiency: Secondary | ICD-10-CM | POA: Diagnosis not present

## 2018-01-06 HISTORY — DX: Nontraumatic subdural hemorrhage, unspecified: I62.00

## 2018-01-06 LAB — URINALYSIS, ROUTINE W REFLEX MICROSCOPIC
BILIRUBIN URINE: NEGATIVE
Glucose, UA: NEGATIVE mg/dL
Hgb urine dipstick: NEGATIVE
KETONES UR: 5 mg/dL — AB
LEUKOCYTES UA: NEGATIVE
NITRITE: NEGATIVE
PH: 6 (ref 5.0–8.0)
Protein, ur: NEGATIVE mg/dL
Specific Gravity, Urine: 1.012 (ref 1.005–1.030)

## 2018-01-06 LAB — I-STAT CG4 LACTIC ACID, ED: Lactic Acid, Venous: 3.49 mmol/L (ref 0.5–1.9)

## 2018-01-06 LAB — COMPREHENSIVE METABOLIC PANEL
ALT: 26 U/L (ref 0–44)
ALT: 22 U/L (ref 0–44)
AST: 19 U/L (ref 15–41)
AST: 22 U/L (ref 15–41)
Albumin: 2.2 g/dL — ABNORMAL LOW (ref 3.5–5.0)
Albumin: 2 g/dL — ABNORMAL LOW (ref 3.5–5.0)
Alkaline Phosphatase: 66 U/L (ref 38–126)
Alkaline Phosphatase: 59 U/L (ref 38–126)
Anion gap: 9 (ref 5–15)
Anion gap: 10 (ref 5–15)
BILIRUBIN TOTAL: 0.7 mg/dL (ref 0.3–1.2)
BUN: 11 mg/dL (ref 6–20)
BUN: 11 mg/dL (ref 6–20)
CHLORIDE: 106 mmol/L (ref 98–111)
CHLORIDE: 105 mmol/L (ref 98–111)
CO2: 22 mmol/L (ref 22–32)
CO2: 22 mmol/L (ref 22–32)
CREATININE: 0.75 mg/dL (ref 0.44–1.00)
Calcium: 8.2 mg/dL — ABNORMAL LOW (ref 8.9–10.3)
Calcium: 8 mg/dL — ABNORMAL LOW (ref 8.9–10.3)
Creatinine, Ser: 0.75 mg/dL (ref 0.44–1.00)
GFR calc Af Amer: >60 mL/min (ref >60)
GFR calc Af Amer: >60 mL/min (ref >60)
GFR calc non Af Amer: >60 mL/min (ref >60)
Glucose, Bld: 82 mg/dL (ref 70–99)
Glucose, Bld: 97 mg/dL (ref 70–99)
POTASSIUM: 4.2 mmol/L (ref 3.5–5.1)
Potassium: 4.2 mmol/L (ref 3.5–5.1)
Sodium: 137 mmol/L (ref 135–145)
Sodium: 137 mmol/L (ref 135–145)
TOTAL PROTEIN: 5.6 g/dL — AB (ref 6.5–8.1)
Total Bilirubin: 0.7 mg/dL (ref 0.3–1.2)
Total Protein: 5 g/dL — ABNORMAL LOW (ref 6.5–8.1)

## 2018-01-06 LAB — MRSA PCR SCREENING: MRSA BY PCR: NEGATIVE

## 2018-01-06 LAB — CBC WITH DIFFERENTIAL/PLATELET
BASOS ABS: 0 10*3/uL (ref 0.0–0.1)
Basophils Relative: 0 %
Eosinophils Absolute: 0 10*3/uL (ref 0.0–0.7)
Eosinophils Relative: 0 %
HEMATOCRIT: 33.2 % — AB (ref 36.0–46.0)
Hemoglobin: 10.9 g/dL — ABNORMAL LOW (ref 12.0–15.0)
LYMPHS ABS: 0.4 10*3/uL — AB (ref 0.7–4.0)
Lymphocytes Relative: 6 %
MCH: 30.1 pg (ref 26.0–34.0)
MCHC: 32.8 g/dL (ref 30.0–36.0)
MCV: 91.7 fL (ref 78.0–100.0)
MONO ABS: 0.2 10*3/uL (ref 0.1–1.0)
Monocytes Relative: 4 %
Neutro Abs: 5.4 10*3/uL (ref 1.7–7.7)
Neutrophils Relative %: 90 %
PLATELETS: 157 10*3/uL (ref 150–400)
RBC: 3.62 MIL/uL — AB (ref 3.87–5.11)
RDW: 14.6 % (ref 11.5–15.5)
WBC: 6 10*3/uL (ref 4.0–10.5)

## 2018-01-06 LAB — PHOSPHORUS: Phosphorus: 2.5 mg/dL (ref 2.5–4.6)

## 2018-01-06 LAB — MAGNESIUM: MAGNESIUM: 1.4 mg/dL — AB (ref 1.7–2.4)

## 2018-01-06 MED ORDER — LEVALBUTEROL HCL 1.25 MG/0.5ML IN NEBU: 1.2500 mg | INHALATION_SOLUTION | Freq: Four times a day (QID) | RESPIRATORY_TRACT | Status: DC

## 2018-01-06 MED ORDER — IPRATROPIUM BROMIDE 0.02 % IN SOLN
0.5000 mg | Freq: Four times a day (QID) | RESPIRATORY_TRACT | Status: DC
  Administered 2018-01-07 – 2018-01-14 (×27): 0.5 mg via RESPIRATORY_TRACT
  Filled 2018-01-06 (×27): qty 2.5

## 2018-01-06 MED ORDER — LACTATED RINGERS IV BOLUS
1000.0000 mL | Freq: Once | INTRAVENOUS | Status: AC
  Administered 2018-01-06: 1000 mL via INTRAVENOUS

## 2018-01-06 MED ORDER — LORAZEPAM 2 MG/ML IJ SOLN
1.0000 mg | Freq: Every evening | INTRAMUSCULAR | Status: DC | PRN
  Administered 2018-01-06 – 2018-01-12 (×4): 1 mg via INTRAVENOUS
  Filled 2018-01-06 (×4): qty 1

## 2018-01-06 MED ORDER — MAGNESIUM SULFATE 2 GM/50ML IV SOLN
2.0000 g | Freq: Once | INTRAVENOUS | Status: AC
  Administered 2018-01-06: 2 g via INTRAVENOUS
  Filled 2018-01-06: qty 50

## 2018-01-06 MED ORDER — SODIUM CHLORIDE 0.9 % IV SOLN
1.0000 g | Freq: Three times a day (TID) | INTRAVENOUS | Status: DC
  Administered 2018-01-06 – 2018-01-09 (×8): 1 g via INTRAVENOUS
  Filled 2018-01-06 (×14): qty 1

## 2018-01-06 MED ORDER — LEVALBUTEROL HCL 1.25 MG/0.5ML IN NEBU
1.2500 mg | INHALATION_SOLUTION | Freq: Four times a day (QID) | RESPIRATORY_TRACT | Status: DC
  Administered 2018-01-07 – 2018-01-14 (×27): 1.25 mg via RESPIRATORY_TRACT
  Filled 2018-01-06 (×27): qty 0.5

## 2018-01-06 MED ORDER — ONDANSETRON HCL 4 MG/2ML IJ SOLN
4.0000 mg | Freq: Four times a day (QID) | INTRAMUSCULAR | Status: DC | PRN
  Administered 2018-01-13 – 2018-01-14 (×5): 4 mg via INTRAVENOUS
  Filled 2018-01-06 (×5): qty 2

## 2018-01-06 MED ORDER — METHYLPREDNISOLONE SODIUM SUCC 125 MG IJ SOLR
125.0000 mg | Freq: Once | INTRAMUSCULAR | Status: AC
  Administered 2018-01-06: 125 mg via INTRAVENOUS
  Filled 2018-01-06: qty 2

## 2018-01-06 MED ORDER — METHYLPREDNISOLONE SODIUM SUCC 40 MG IJ SOLR
40.0000 mg | Freq: Four times a day (QID) | INTRAMUSCULAR | Status: AC
  Administered 2018-01-07 – 2018-01-08 (×8): 40 mg via INTRAVENOUS
  Filled 2018-01-06 (×9): qty 1

## 2018-01-06 MED ORDER — FENTANYL CITRATE (PF) 100 MCG/2ML IJ SOLN
50.0000 ug | Freq: Once | INTRAMUSCULAR | Status: AC
  Administered 2018-01-06: 50 ug via INTRAVENOUS
  Filled 2018-01-06: qty 2

## 2018-01-06 MED ORDER — LEVALBUTEROL HCL 1.25 MG/0.5ML IN NEBU
1.2500 mg | INHALATION_SOLUTION | Freq: Four times a day (QID) | RESPIRATORY_TRACT | Status: DC | PRN
  Administered 2018-01-06: 1.25 mg via RESPIRATORY_TRACT
  Filled 2018-01-06: qty 0.5

## 2018-01-06 MED ORDER — ACETAMINOPHEN 650 MG RE SUPP: 650.0000 mg | Freq: Four times a day (QID) | RECTAL | Status: DC | PRN

## 2018-01-06 MED ORDER — PANTOPRAZOLE SODIUM 40 MG PO TBEC
40.0000 mg | DELAYED_RELEASE_TABLET | Freq: Every day | ORAL | Status: DC
  Administered 2018-01-07 – 2018-01-14 (×8): 40 mg via ORAL
  Filled 2018-01-06 (×8): qty 1

## 2018-01-06 MED ORDER — LEVOFLOXACIN IN D5W 750 MG/150ML IV SOLN
750.0000 mg | Freq: Once | INTRAVENOUS | Status: AC
  Administered 2018-01-06: 750 mg via INTRAVENOUS
  Filled 2018-01-06: qty 150

## 2018-01-06 MED ORDER — ONDANSETRON HCL 4 MG PO TABS
4.0000 mg | ORAL_TABLET | Freq: Four times a day (QID) | ORAL | Status: DC | PRN
  Administered 2018-01-11 – 2018-01-12 (×3): 4 mg via ORAL
  Filled 2018-01-06 (×4): qty 1

## 2018-01-06 MED ORDER — KETOROLAC TROMETHAMINE 30 MG/ML IJ SOLN
30.0000 mg | Freq: Once | INTRAMUSCULAR | Status: AC
  Administered 2018-01-06: 30 mg via INTRAVENOUS
  Filled 2018-01-06: qty 1

## 2018-01-06 MED ORDER — ENOXAPARIN SODIUM 40 MG/0.4ML ~~LOC~~ SOLN
40.0000 mg | SUBCUTANEOUS | Status: DC
  Administered 2018-01-06 – 2018-01-13 (×8): 40 mg via SUBCUTANEOUS
  Filled 2018-01-06 (×8): qty 0.4

## 2018-01-06 MED ORDER — ACETAMINOPHEN 325 MG PO TABS
650.0000 mg | ORAL_TABLET | Freq: Once | ORAL | Status: AC
  Administered 2018-01-06: 650 mg via ORAL
  Filled 2018-01-06: qty 2

## 2018-01-06 MED ORDER — VANCOMYCIN HCL IN DEXTROSE 750-5 MG/150ML-% IV SOLN
750.0000 mg | Freq: Two times a day (BID) | INTRAVENOUS | Status: DC
  Administered 2018-01-06 – 2018-01-07 (×2): 750 mg via INTRAVENOUS
  Filled 2018-01-06 (×5): qty 150

## 2018-01-06 MED ORDER — ACETAMINOPHEN 325 MG PO TABS: 650.0000 mg | ORAL_TABLET | Freq: Four times a day (QID) | ORAL | Status: DC | PRN

## 2018-01-06 MED ORDER — SODIUM CHLORIDE 0.9 % IV SOLN
INTRAVENOUS | Status: DC
  Administered 2018-01-06 – 2018-01-10 (×8): via INTRAVENOUS

## 2018-01-06 NOTE — ED Notes (Signed)
Pt stuck 2 more time to attemp 2nd IV. Pt is bruised from previous hospital stay

## 2018-01-06 NOTE — ED Notes (Signed)
Pt nausea, coughing up yellow sputum

## 2018-01-06 NOTE — ED Triage Notes (Signed)
Pt discharged yesterday with Pneumonia, pt started on medication today,, call EMS for SOB.  02 sat was 88% on room air at home

## 2018-01-06 NOTE — Progress Notes (Signed)
[facility] called and stated the pt needed another lactic acid ordered d/t sepsis.  Dr. Robb Matar paged and made aware of [facility] request as well as pts BP decreasing to 82/62 manually.  New order for lactic acid STAT. Will continue to monitor pts BP

## 2018-01-06 NOTE — ED Notes (Signed)
Pt to bsc   Increased sob noted

## 2018-01-06 NOTE — ED Notes (Signed)
Lab at the bedside 

## 2018-01-06 NOTE — ED Provider Notes (Signed)
Emergency Department Provider Note   I have reviewed the triage vital signs and the nursing notes.   HISTORY  Chief Complaint Shortness of Breath   HPI Melanie [name] is a 52 y.o. female who was discharged from the hospital yesterday secondary to right-sided pneumonia who presents to the emergency department today secondary to persistent dyspnea, feeling unwell and worsening cough.  Patient states that she did not feel much better than she did when she was admitted to the hospital the first time.  No new sick contacts.  No new trauma.  Has her Levaquin in the bag and states that she took it. No other associated or modifying symptoms.    Past Medical History:  Diagnosis Date  . Asthma   . GERD (gastroesophageal reflux disease)   . Head injury, acute   . Hypertension   . Risk for falls   . Short-term memory loss     Patient Active Problem List   Diagnosis Date Noted  . CAP (community acquired pneumonia) 01/06/2018  . HCAP (healthcare-associated pneumonia) 01/06/2018  . GERD (gastroesophageal reflux disease) 01/06/2018  . Tobacco use 01/06/2018  . Multifocal pneumonia 01/03/2018  . Asthma exacerbation 01/03/2018  . Falls 01/03/2018  . Anemia 01/03/2018  . Hypokalemia 01/03/2018  . AKI (acute kidney injury) (HCC) 01/03/2018  . Hypertension 01/03/2018  . Lymphopenia 01/03/2018  . Pneumococcal pneumonia (HCC) 11/29/2011  . Thrombocytopenia (HCC) 11/29/2011  . Acute respiratory failure (HCC) 11/24/2011  . Septic shock(785.52) 11/24/2011  . BIPOLAR DISORDER UNSPECIFIED 04/08/2009  . 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 PNEUMONIA 04/08/2009  . SEIZURE DISORDER 04/08/2009  . SUBSTANCE ABUSE, MULTIPLE 09/22/2008    Past Surgical History:  Procedure Laterality Date  . BRAIN SURGERY    . HERNIA REPAIR      Current Outpatient Rx  . Order #: [id number] Class: Print  . Order #: [id number] Class: Normal  . Order #: [id number] Class: Normal    Allergies Patient has no known  allergies.  Family History  Problem Relation Age of Onset  . Hypertension Father   . Diabetes Father   . Hypertension Other   . Diabetes Other     Social History Social History   Tobacco Use  . Smoking status: Current Every Day Smoker    Packs/day: 1.00    Years: 35.00    Pack years: 35.00    Types: Cigarettes  . Smokeless tobacco: Never Used  Substance Use Topics  . Alcohol use: Yes    Comment: occas  . Drug use: No    Comment: pt snorts px meds.    Review of Systems  All other systems negative except as documented in the HPI. All pertinent positives and negatives as reviewed in the HPI. ____________________________________________   PHYSICAL EXAM:  VITAL SIGNS: ED Triage Vitals  Enc Vitals Group     BP 01/06/18 1650 92/67     Pulse Rate 01/06/18 1650 (!) 129     Resp 01/06/18 1650 20     Temp 01/06/18 1650 98.9 F (37.2 C)     Temp Source 01/06/18 1650 Oral     SpO2 01/06/18 1650 (!) 88 %     Weight 01/06/18 1651 131 lb (59.4 kg)     Height 01/06/18 1651 4\' 11"  (1.499 m)    Constitutional: Alert and oriented. Ill appearing and in mild respiratory distress. Eyes: Conjunctivae are normal. PERRL. EOMI. Head: Atraumatic. Nose: No congestion/rhinnorhea. Mouth/Throat: Mucous membranes are dry.  Oropharynx non-erythematous. Neck: No stridor.  No meningeal signs.  Cardiovascular: tachycardic rate, regular rhythm. Good peripheral circulation. Grossly normal heart sounds.   Respiratory: tachypneic respiratory effort.  Access muscle use. Diffuse rhonchi Gastrointestinal: Soft and nontender. No distention.  Musculoskeletal: No lower extremity tenderness nor edema. No gross deformities of extremities. Neurologic:  Normal speech and language. No gross focal neurologic deficits are appreciated.  Skin:  Skin is warm, dry and intact. No rash noted.  ____________________________________________   LABS (all labs ordered are listed, but only abnormal results are  displayed)  Labs Reviewed  COMPREHENSIVE METABOLIC PANEL - Abnormal; Notable for the following components:      Result Value   Calcium 8.2 (*)    Total Protein 5.6 (*)    Albumin 2.2 (*)    All other components within normal limits  CBC WITH DIFFERENTIAL/PLATELET - Abnormal; Notable for the following components:   RBC 3.62 (*)    Hemoglobin 10.9 (*)    HCT 33.2 (*)    Lymphs Abs 0.4 (*)    All other components within normal limits  URINALYSIS, ROUTINE W REFLEX MICROSCOPIC - Abnormal; Notable for the following components:   APPearance HAZY (*)    Ketones, ur 5 (*)    All other components within normal limits  COMPREHENSIVE METABOLIC PANEL - Abnormal; Notable for the following components:   Calcium 8.0 (*)    Total Protein 5.0 (*)    Albumin 2.0 (*)    All other components within normal limits  I-STAT CG4 LACTIC ACID, ED - Abnormal; Notable for the following components:   Lactic Acid, Venous 3.49 (*)    All other components within normal limits  CULTURE, BLOOD (ROUTINE X 2)  CULTURE, BLOOD (ROUTINE X 2)  EXPECTORATED SPUTUM ASSESSMENT W REFEX TO RESP CULTURE  GRAM STAIN  MAGNESIUM  PHOSPHORUS  STREP PNEUMONIAE URINARY ANTIGEN  CBC WITH DIFFERENTIAL/PLATELET  BASIC METABOLIC PANEL  I-STAT CG4 LACTIC ACID, ED   ____________________________________________  EKG   EKG Interpretation  Date/Time:  Monday January 06 2018 18:06:44 EDT Ventricular Rate:  125 PR Interval:    QRS Duration: 110 QT Interval:  286 QTC Calculation: 413 R Axis:   -21 Text Interpretation:  poor tracing, impossible to interpret Confirmed by Marily Memos [phone]) on 01/06/2018 6:41:20 PM       ____________________________________________  RADIOLOGY  Dg Chest Portable 1 View  Result Date: 01/06/2018 CLINICAL DATA:  Worsening pneumonia, shortness of breath EXAM: PORTABLE CHEST 1 VIEW COMPARISON:  Portable exam 1912 hours compared to 01/03/2018 FINDINGS: Normal heart size, mediastinal contours,  and pulmonary vascularity. Increasing diffuse RIGHT lung infiltrates consistent with worsening pneumonia. LEFT lung grossly clear. No pleural effusion or pneumothorax. Bones unremarkable. IMPRESSION: Increasing RIGHT lung infiltrates consistent with worsening pneumonia. Electronically Signed   By: Ulyses Southward M.D.   On: 01/06/2018 20:17    ____________________________________________   PROCEDURES  Procedure(s) performed:   Procedures  CRITICAL CARE Performed by: Marily Memos Total critical care time: 35 minutes Critical care time was exclusive of separately billable procedures and treating other patients. Critical care was necessary to treat or prevent imminent or life-threatening deterioration. Critical care was time spent personally by me on the following activities: development of treatment plan with patient and/or surrogate as well as nursing, discussions with consultants, evaluation of patient's response to treatment, examination of patient, obtaining history from patient or surrogate, ordering and performing treatments and interventions, ordering and review of laboratory studies, ordering and review of radiographic studies, pulse oximetry and re-evaluation of patient's condition.  ____________________________________________  INITIAL IMPRESSION / ASSESSMENT AND PLAN / ED COURSE  Assistant pneumonia.  On my initial evaluation patient did not look well and I considered BiPAP versus intubation however after some breathing treatments and antibiotics and fluids and defervesced since she had significant improvement.  Found to have x-ray evidence of pneumonia and worsening lactic acid compared to her hospital stay yesterday.  Did not consider this hospital-acquired pneumonia she had pneumonia prior to coming hospital the first time so discontinued her Levaquin.  However she continues to not improve may consider broadening this out.  Discussed with hospitalist who will admit to stepdown.       Pertinent labs & imaging results that were available during my care of the patient were reviewed by me and considered in my medical decision making (see chart for details).  ____________________________________________  FINAL CLINICAL IMPRESSION(S) / ED DIAGNOSES  Final diagnoses:  Sepsis, due to unspecified organism, unspecified whether acute organ dysfunction present (HCC)  Hypoxia  Acute respiratory failure with hypoxia (HCC)  Community acquired pneumonia of right middle lobe of lung (HCC)     MEDICATIONS GIVEN DURING THIS VISIT:  Medications  enoxaparin (LOVENOX) injection 40 mg (has no administration in time range)  0.9 %  sodium chloride infusion (has no administration in time range)  acetaminophen (TYLENOL) tablet 650 mg (has no administration in time range)    Or  acetaminophen (TYLENOL) suppository 650 mg (has no administration in time range)  ondansetron (ZOFRAN) tablet 4 mg (has no administration in time range)    Or  ondansetron (ZOFRAN) injection 4 mg (has no administration in time range)  ceFEPIme (MAXIPIME) 1 g in sodium chloride 0.9 % 100 mL IVPB (has no administration in time range)  methylPREDNISolone sodium succinate (SOLU-MEDROL) 125 mg/2 mL injection 125 mg (has no administration in time range)  vancomycin (VANCOCIN) IVPB 750 mg/150 ml premix (has no administration in time range)  ipratropium (ATROVENT) nebulizer solution 0.5 mg (has no administration in time range)  levalbuterol (XOPENEX) nebulizer solution 1.25 mg (has no administration in time range)  magnesium sulfate IVPB 2 g 50 mL (has no administration in time range)  ketorolac (TORADOL) 30 MG/ML injection 30 mg (has no administration in time range)  levalbuterol (XOPENEX) nebulizer solution 1.25 mg (has no administration in time range)  lactated ringers bolus 1,000 mL (1,000 mLs Intravenous New Bag/Given 01/06/18 1946)  lactated ringers bolus 1,000 mL (0 mLs Intravenous Stopped 01/06/18 1942)   levofloxacin (LEVAQUIN) IVPB 750 mg (0 mg Intravenous Stopped 01/06/18 2031)  fentaNYL (SUBLIMAZE) injection 50 mcg (50 mcg Intravenous Given 01/06/18 1928)  acetaminophen (TYLENOL) tablet 650 mg (650 mg Oral Given 01/06/18 1926)     NEW OUTPATIENT MEDICATIONS STARTED DURING THIS VISIT:  New Prescriptions   No medications on file    Note:  This note was prepared with assistance of Dragon voice recognition software. Occasional wrong-word or sound-a-like substitutions may have occurred due to the inherent limitations of voice recognition software.   Marily Memos, MD 01/06/18 2056

## 2018-01-06 NOTE — ED Notes (Signed)
CRITICAL VALUE   Test: Lactic Acid   Value: 3.49  Date/Time: 01/06/2018 1834  MD Notified: Dr. Clayborne Dana, MD

## 2018-01-06 NOTE — ED Notes (Signed)
Blood cultures complete 

## 2018-01-06 NOTE — ED Notes (Addendum)
Pt assisted to the bathroom, SOB, congested cough

## 2018-01-06 NOTE — Progress Notes (Signed)
Pharmacy Antibiotic Note  Melanie [name] is a 52 y.o. female admitted on 01/06/2018 with pneumonia.  Pharmacy has been consulted for Vancomycin dosing.  Plan: Vancomycin 750mg  IV every 12 hours.  Goal trough 15-20 mcg/mL.  Monitor labs, micro and vitals.   Height: 4\' 11"  (149.9 cm) Weight: 131 lb (59.4 kg) IBW/kg (Calculated) : 43.2  Temp (24hrs), Avg:100.4 F (38 C), Min:98.9 F (37.2 C), Max:101.9 F (38.8 C)  Recent Labs  Lab 01/03/18 1401 01/04/18 0619 01/05/18 0523 01/06/18 1828 01/06/18 1829  WBC 2.8* 2.1* 3.7* 6.0  --   CREATININE 1.27* 0.90 0.70 0.75  --   LATICACIDVEN 1.8  --   --   --  3.49*    Estimated Creatinine Clearance: 63.1 mL/min (by C-G formula based on SCr of 0.75 mg/dL).    No Known Allergies  Antimicrobials this admission: Vanc 10/7 >>  Cefepime 10/7 >>   Dose adjustments this admission: n/a   Microbiology results: 10/7 BCx: pendiing  UCx:    Sputum:    MRSA PCR:   Thank you for allowing pharmacy to be a part of this patient's care.  Mady Gemma 01/06/2018 8:17 PM

## 2018-01-06 NOTE — H&P (Addendum)
History and Physical    Melanie [name]:[id number] DOB: 02/29/1966 DOA: 01/06/2018  PCP: Tanna Furry, MD   Patient coming from: Home.  I have personally briefly reviewed patient's old medical records in [facility]  Chief Complaint: Shortness of breath.  HPI: Melanie [name] is a 52 y.o. female with medical history significant of asthma, GERD, history of subdural hematoma following salicylate poisoning, hypertension, history of short-term memory loss, risk for falls who was discharged yesterday from [facility] due to community-acquired pneumonia very returns today to the emergency department due to progressively worse dyspnea, worsening productive cough, wheezing, pleuritic chest pain, fatigue, malaise and fever.  EMS reported that the patient initial O2 sat was 88% on room air at home.  She denies headache, rhinorrhea, sore throat, hemoptysis.  No dizziness, palpitations, diaphoresis, PND, orthopnea or pitting edema of the lower extremities.  Denies nausea, emesis, diarrhea, constipation, melena or hematochezia.  No dysuria, frequency or hematuria.  No polyuria, polydipsia, polyphagia or blurred vision.  No heat or cold intolerance.  ED Course: Initial vital signs temperature 98.9 F, but subsequently decreased to 101.9 F, pulse 129, respirations 20, blood pressure 92/67 mmHg and O2 sat 88% on room air.  The patient received 2000 mL of LR bolus, acetaminophen 615 mg p.o. x1 dose, fentanyl 50 mcg x 1 and levofloxacin 750 mg IVP x1 dose.  I added Solu-Medrol 125 mg IV x1, Toradol 30 mg IVP x1 and magnesium sulfate 2 g IVPB.  Urinalysis shows a hazy appearance and ketonuria 5 mg/dL.  All other values within normal limits.  White count was 6.0 with 90% neutrophils, 6% lymphocytes and 4% monocytes.  Hemoglobin 10.9 g/dL and platelets 811.  Lactic acid was 3.49 mmol/L.  So far her electrolytes, corrected calcium to albumin, pending magnesium and phosphorus are within normal limits.  Total  protein was 5.6 and albumin 2.2 g/dL.  Hepatic and renal function are within normal limits.  A 1 view chest radiograph shows worsening right lung pneumonia.  Please see images and radiology report for further detail.  Review of Systems: As per HPI otherwise 10 point review of systems negative.   Past Medical History:  Diagnosis Date  . Asthma   . GERD (gastroesophageal reflux disease)   . Head injury, acute   . Hypertension   . Risk for falls   . Short-term memory loss     Past Surgical History:  Procedure Laterality Date  . BRAIN SURGERY    . HERNIA REPAIR       reports that she has been smoking cigarettes. She has a 35.00 pack-year smoking history. She has never used smokeless tobacco. She reports that she drinks alcohol. She reports that she does not use drugs.  No Known Allergies  Family History  Problem Relation Age of Onset  . Hypertension Father   . Diabetes Father   . Hypertension Other   . Diabetes Other     Prior to Admission medications   Medication Sig Start Date End Date Taking? Authorizing Provider  albuterol (PROVENTIL HFA;VENTOLIN HFA) 108 (90 Base) MCG/ACT inhaler Inhale 2 puffs into the lungs every 4 (four) hours as needed for wheezing or shortness of breath (or coughing). 05/14/17   Dione Booze, MD  levofloxacin (LEVAQUIN) 500 MG tablet Take 1 tablet (500 mg total) by mouth daily. 01/06/18   Mikhail, Nita Sells, DO  predniSONE (DELTASONE) 10 MG tablet Take 4 tabs x 1day, then 3 tabs x 3days, then 2 tabs x 3days, then 1 tab  x 3days 01/05/18   Edsel Petrin, DO    Physical Exam: Vitals:   01/06/18 1915 01/06/18 1930 01/06/18 2010 01/06/18 2012  BP:  101/74 107/77   Pulse: (!) 124 (!) 123 (!) 121 (!) 118  Resp: (!) 24 (!) 33 (!) 37 (!) 37  Temp:      TempSrc:      SpO2: 91% 94%  91%  Weight:      Height:        Constitutional: Mildly febrile, looks acutely ill. Eyes: PERRL, lids and conjunctivae look mildly pale. ENMT: Mucous membranes and lips are  dry. Posterior pharynx clear of any exudate or lesions. Poor state of repair of dentition.  Neck: normal, supple, no masses, no thyromegaly Respiratory: Tachypneic at 30 breaths/min.  No accessory muscle use. Decreased breath sounds with bibasilar crackles, bilateral rhonchi and crackles on right lung fields.  Cardiovascular: Tachycardic at 121 bpm, no murmurs / rubs / gallops. No extremity edema. 2+ pedal pulses. No carotid bruits.  Abdomen: Soft, no tenderness, no masses palpated. No hepatosplenomegaly. Bowel sounds positive.  Musculoskeletal: no clubbing / cyanosis. Good ROM, no contractures. Normal muscle tone.  Skin: no rashes, lesions, ulcers on limited dermatological examination. Neurologic: CN 2-12 grossly intact. Sensation intact, DTR normal. Strength 5/5 in all 4.  Psychiatric: Alert and oriented x 3.  Mildly anxious mood.   Labs on Admission: I have personally reviewed following labs and imaging studies  CBC: Recent Labs  Lab 01/03/18 1401 01/04/18 0619 01/05/18 0523 01/06/18 1828  WBC 2.8* 2.1* 3.7* 6.0  NEUTROABS 2.1  --   --  5.4  HGB 11.2* 9.0* 10.6* 10.9*  HCT 33.1* 27.6* 32.0* 33.2*  MCV 90.2 92.3 91.7 91.7  PLT 181 155 186 157   Basic Metabolic Panel: Recent Labs  Lab 01/03/18 1401 01/04/18 0619 01/05/18 0523 01/05/18 1456 01/06/18 1828  NA 136 140 141  --  137  K 3.0* 3.5 2.6* 4.1 4.2  CL 105 113* 112*  --  106  CO2 23 22 20*  --  22  GLUCOSE 116* 179* 138*  --  82  BUN 30* 20 14  --  11  CREATININE 1.27* 0.90 0.70  --  0.75  CALCIUM 8.5* 7.7* 8.3*  --  8.2*  MG 1.9  --  2.0  --   --    GFR: Estimated Creatinine Clearance: 63.1 mL/min (by C-G formula based on SCr of 0.75 mg/dL). Liver Function Tests: Recent Labs  Lab 01/03/18 1401 01/06/18 1828  AST 27 19  ALT 20 26  ALKPHOS 62 66  BILITOT 0.8 0.7  PROT 6.8 5.6*  ALBUMIN 3.2* 2.2*   No results for input(s): LIPASE, AMYLASE in the last 168 hours. No results for input(s): AMMONIA in the last  168 hours. Coagulation Profile: No results for input(s): INR, PROTIME in the last 168 hours. Cardiac Enzymes: No results for input(s): CKTOTAL, CKMB, CKMBINDEX, TROPONINI in the last 168 hours. BNP (last 3 results) No results for input(s): PROBNP in the last 8760 hours. HbA1C: No results for input(s): HGBA1C in the last 72 hours. CBG: No results for input(s): GLUCAP in the last 168 hours. Lipid Profile: No results for input(s): CHOL, HDL, LDLCALC, TRIG, CHOLHDL, LDLDIRECT in the last 72 hours. Thyroid Function Tests: No results for input(s): TSH, T4TOTAL, FREET4, T3FREE, THYROIDAB in the last 72 hours. Anemia Panel: No results for input(s): VITAMINB12, FOLATE, FERRITIN, TIBC, IRON, RETICCTPCT in the last 72 hours. Urine analysis:    Component  Value Date/Time   COLORURINE STRAW (A) 11/23/2017 1346   APPEARANCEUR CLEAR 11/23/2017 1346   LABSPEC 1.001 (L) 11/23/2017 1346   PHURINE 8.0 11/23/2017 1346   GLUCOSEU NEGATIVE 11/23/2017 1346   HGBUR SMALL (A) 11/23/2017 1346   BILIRUBINUR NEGATIVE 11/23/2017 1346   KETONESUR NEGATIVE 11/23/2017 1346   PROTEINUR NEGATIVE 11/23/2017 1346   UROBILINOGEN 0.2 01/02/2012 1615   NITRITE NEGATIVE 11/23/2017 1346   LEUKOCYTESUR NEGATIVE 11/23/2017 1346    Radiological Exams on Admission: No results found.  EKG: Independently reviewed.  Vent. rate 124 BPM PR interval * ms QRS duration 62 ms QT/QTc 287/413 ms P-R-T axes 66 -25 81 Sinus tachycardia Ventricular premature complex Aberrant conduction of SV complex(es) Borderline left axis deviation Abnormal R-wave progression, early transition  Assessment/Plan Principal problem:   HCAP (healthcare-associated pneumonia) Admit to stepdown/inpatient. Continue supplemental oxygen. Continue IV fluids. Schedule and as needed bronchodilators. Hold prednisone and switch to parenteral methylprednisolone. Cefepime 1 g IVPB every 8 hours. Vancomycin per pharmacy. Follow-up blood culture and  sensitivity. Check sputum Gram stain, culture and sensitivity. Check strep pneumoniae urinary antigen.  Active Problems:   Asthma exacerbation Continue treatment for pneumonia as above. Continue parenteral glucocorticoids. Continue bronchodilators.    Abnormal EKG Multiple risk factors for CAD. Check check echocardiogram.    Sinus tachycardia Secondary to sepsis and pneumonia induced hypoxia. Xopenex for beta agonist treatment. Keep electrolytes optimized. Continue treatment as above mentioned.    Anemia Check anemia panel. Monitor hematocrit and hemoglobin.    Hypertension Not on antihypertensive therapy. Hold antihypertensives for now. Continue treatment for pneumonia. Monitor blood pressure.    GERD (gastroesophageal reflux disease) Protonix 40 mg p.o. daily.    Tobacco use Of her nicotine replacement therapy. Stated she does not feel any cravings to smoke at this time. Staff to provide tobacco cessation information.    DVT prophylaxis: Lovenox SQ. Code Status: Full code. Family Communication: Disposition Plan: Admit to stepdown for IV antibiotic therapy for several days. Consults called: Admission status: Inpatient/stepdown.   Bobette Mo MD [facility] Pager [phone].  If 7PM-7AM, please contact night-coverage [email or web address] Password [email or web address]  01/06/2018, 8:14 PM

## 2018-01-07 ENCOUNTER — Inpatient Hospital Stay (HOSPITAL_COMMUNITY): Payer: Medicaid Other

## 2018-01-07 DIAGNOSIS — K219 Gastro-esophageal reflux disease without esophagitis: Secondary | ICD-10-CM

## 2018-01-07 DIAGNOSIS — J45901 Unspecified asthma with (acute) exacerbation: Secondary | ICD-10-CM

## 2018-01-07 DIAGNOSIS — R9431 Abnormal electrocardiogram [ECG] [EKG]: Secondary | ICD-10-CM

## 2018-01-07 DIAGNOSIS — J9601 Acute respiratory failure with hypoxia: Secondary | ICD-10-CM

## 2018-01-07 DIAGNOSIS — R000 Tachycardia, unspecified: Secondary | ICD-10-CM

## 2018-01-07 LAB — CBC WITH DIFFERENTIAL/PLATELET
Basophils Absolute: 0 10*3/uL (ref 0.0–0.1)
Basophils Relative: 0 %
EOS PCT: 0 %
Eosinophils Absolute: 0 10*3/uL (ref 0.0–0.5)
HEMATOCRIT: 30.3 % — AB (ref 36.0–46.0)
HEMOGLOBIN: 9.9 g/dL — AB (ref 12.0–15.0)
LYMPHS ABS: 0.4 10*3/uL — AB (ref 0.7–4.0)
Lymphocytes Relative: 10 %
MCH: 30 pg (ref 26.0–34.0)
MCHC: 32.7 g/dL (ref 30.0–36.0)
MCV: 91.8 fL (ref 80.0–100.0)
Monocytes Absolute: 0.1 10*3/uL (ref 0.1–1.0)
Monocytes Relative: 2 %
NEUTROS ABS: 3.3 10*3/uL (ref 1.7–7.7)
Neutrophils Relative %: 88 %
PLATELETS: 121 10*3/uL — AB (ref 150–400)
RBC: 3.3 MIL/uL — ABNORMAL LOW (ref 3.87–5.11)
RDW: 15 % (ref 11.5–15.5)
WBC: 3.8 10*3/uL — ABNORMAL LOW (ref 4.0–10.5)

## 2018-01-07 LAB — BASIC METABOLIC PANEL
Anion gap: 7 (ref 5–15)
BUN: 14 mg/dL (ref 6–20)
CO2: 25 mmol/L (ref 22–32)
CREATININE: 0.61 mg/dL (ref 0.44–1.00)
Calcium: 8 mg/dL — ABNORMAL LOW (ref 8.9–10.3)
Chloride: 108 mmol/L (ref 98–111)
GFR calc Af Amer: >60 mL/min (ref >60)
Glucose, Bld: 101 mg/dL — ABNORMAL HIGH (ref 70–99)
Potassium: 4 mmol/L (ref 3.5–5.1)
SODIUM: 140 mmol/L (ref 135–145)

## 2018-01-07 LAB — LACTIC ACID, PLASMA
LACTIC ACID, VENOUS: 2.8 mmol/L — AB (ref 0.5–1.9)
Lactic Acid, Venous: 1.8 mmol/L (ref 0.5–1.9)

## 2018-01-07 LAB — IRON AND TIBC
Iron: 23 ug/dL — ABNORMAL LOW (ref 28–170)
SATURATION RATIOS: 14 % (ref 10.4–31.8)
TIBC: 162 ug/dL — ABNORMAL LOW (ref 250–450)
UIBC: 139 ug/dL

## 2018-01-07 LAB — ECHOCARDIOGRAM COMPLETE
HEIGHTINCHES: 59 in
WEIGHTICAEL: 2250.46 [oz_av]

## 2018-01-07 LAB — RETICULOCYTES
RBC.: 3.3 MIL/uL — ABNORMAL LOW (ref 3.87–5.11)
RETIC CT PCT: 1.1 % (ref 0.4–3.1)
Retic Count, Absolute: 36.3 10*3/uL (ref 19.0–186.0)

## 2018-01-07 LAB — VITAMIN B12: VITAMIN B 12: 221 pg/mL (ref 180–914)

## 2018-01-07 LAB — STREP PNEUMONIAE URINARY ANTIGEN: Strep Pneumo Urinary Antigen: NEGATIVE

## 2018-01-07 LAB — FOLATE: Folate: 3.9 ng/mL — ABNORMAL LOW (ref >5.9)

## 2018-01-07 LAB — FERRITIN: FERRITIN: 467 ng/mL — AB (ref 11–307)

## 2018-01-07 MED ORDER — KETOROLAC TROMETHAMINE 30 MG/ML IJ SOLN
30.0000 mg | Freq: Four times a day (QID) | INTRAMUSCULAR | Status: AC | PRN
  Administered 2018-01-07 – 2018-01-12 (×12): 30 mg via INTRAVENOUS
  Filled 2018-01-07 (×12): qty 1

## 2018-01-07 MED ORDER — HYDROCODONE-ACETAMINOPHEN 5-325 MG PO TABS
1.0000 | ORAL_TABLET | Freq: Four times a day (QID) | ORAL | Status: DC | PRN
  Administered 2018-01-07 – 2018-01-08 (×5): 1 via ORAL
  Administered 2018-01-09: 2 via ORAL
  Administered 2018-01-09: 1 via ORAL
  Filled 2018-01-07 (×3): qty 1
  Filled 2018-01-07: qty 2
  Filled 2018-01-07 (×3): qty 1

## 2018-01-07 MED ORDER — ORAL CARE MOUTH RINSE
15.0000 mL | Freq: Two times a day (BID) | OROMUCOSAL | Status: DC
  Administered 2018-01-07 – 2018-01-13 (×10): 15 mL via OROMUCOSAL

## 2018-01-07 NOTE — Progress Notes (Signed)
*  PRELIMINARY RESULTS* Echocardiogram 2D Echocardiogram has been performed.  Melanie [name] 01/07/2018, 2:24 PM

## 2018-01-07 NOTE — Progress Notes (Signed)
OT Cancellation Note  Patient Details Name: Melanie [name] MRN: [id number] DOB: 1965-08-13   Cancelled Treatment:    Reason Eval/Treat Not Completed: Other (comment). Pt sleeping on OT arrival, continues to use BiPAP. Will check back at a later time and evaluate when off of BiPAP.    Ezra Sites, OTR/L  [phone] 01/07/2018, 7:49 AM

## 2018-01-07 NOTE — Plan of Care (Signed)
  Problem: Acute Rehab PT Goals(only PT should resolve) Goal: Pt Will Go Supine/Side To Sit Outcome: Progressing Flowsheets (Taken 01/07/2018 1140) Pt will go Supine/Side to Sit: with supervision Goal: Patient Will Transfer Sit To/From Stand Outcome: Progressing Flowsheets (Taken 01/07/2018 1140) Patient will transfer sit to/from stand: with supervision Goal: Pt Will Transfer Bed To Chair/Chair To Bed Outcome: Progressing Flowsheets (Taken 01/07/2018 1140) Pt will Transfer Bed to Chair/Chair to Bed: with supervision Goal: Pt Will Ambulate Outcome: Progressing Flowsheets (Taken 01/07/2018 1140) Pt will Ambulate: 50 feet; with rolling walker; with cane; with supervision   11:40 AM, 01/07/18 Ocie Bob, MPT Physical Therapist with [facility] 336 [phone] office [phone] mobile phone

## 2018-01-07 NOTE — Progress Notes (Signed)
Initial Nutrition Assessment  DOCUMENTATION CODES:      INTERVENTION:  Heart Healthy diet    NUTRITION DIAGNOSIS:   Increased nutrient needs related to acute illness(Sepsis-pneumonia) as evidenced by estimated needs.   GOAL:   Patient will meet greater than or equal to 90% of their needs  MONITOR:   PO intake, Weight trends, Labs  REASON FOR ASSESSMENT:   Consult Assessment of nutrition requirement/status  ASSESSMENT:  Patient is a 52 yo female with hx of Asthma, hypertension, GERD. Smokes tobacco 1 pack/day. Patient presents with right side community acquired pneumonia, sepsis, hypoxia-acute respiratory failure requiring BiPAP.  Appetite is good -100% of meals consumed. Able to feed herself. Poor dentition. Patient says she receives EBT benefit-$90.00 per month and also goes to [employer] every 5 weeks for food. Dicussed other alternate local food resources. Denies changes in appetite or usual intake.    Weight history shows gain-3.6 kg in 4 days which is significant increase (6%) up from usual body weight range of 58-60 kg.  Labs: BMP Latest Ref Rng & Units 01/07/2018 01/06/2018 01/06/2018  Glucose 70 - 99 mg/dL 161(W) 97 82  BUN 6 - 20 mg/dL 14 11 11   Creatinine 0.44 - 1.00 mg/dL 9.60 4.54 0.98  Sodium 135 - 145 mmol/L 140 137 137  Potassium 3.5 - 5.1 mmol/L 4.0 4.2 4.2  Chloride 98 - 111 mmol/L 108 105 106  CO2 22 - 32 mmol/L 25 22 22   Calcium 8.9 - 10.3 mg/dL 8.0(L) 8.0(L) 8.2(L)    Medications reviewed and include: Protonix, Solumedrol, Vancomycin  NUTRITION - FOCUSED PHYSICAL EXAM: WDL   Diet Order:   Diet Order            Diet Heart Room service appropriate? Yes; Fluid consistency: Thin  Diet effective now              EDUCATION NEEDS:   Education needs have been addressed  Skin:  Skin Assessment: Reviewed RN Assessment(abrasion to right knee)  Last BM:  01/06/18   Height:   Ht Readings from Last 1 Encounters:  01/06/18 4\' 11"  (1.499 m)     Weight:   Wt Readings from Last 1 Encounters:  01/07/18 63.8 kg    Ideal Body Weight:  45 kg  BMI:  Body mass index is 28.41 kg/m.  Estimated Nutritional Needs:   Kcal:  1191-4782 (24-26 kcal/kg/bw)  Protein:  77-83  (1.2-1.3 gr/kg/bw)  Fluid:  1.5-1.7 liters daily   Royann Shivers MS,RD,CSG,LDN Office: [phone] Pager: [phone]

## 2018-01-07 NOTE — Progress Notes (Signed)
PROGRESS NOTE    Melanie [name]  ZOX:[id number] DOB: 1966-03-02 DOA: 01/06/2018 PCP: Tanna Furry, MD    Brief Narrative:  52 year old female with a history of asthma, prior subdural hematoma, short-term memory loss, who was discharged from the hospital on 10/6 and unfortunately had to return the following day with worsening shortness of breath and fever.  Found to have worsening pneumonia.  Was also felt that she had an asthma exacerbation.  She was started on IV antibiotics as well as intravenous steroids.  She required BiPAP on admission.  She is slowly improving.   Assessment & Plan:   Principal Problem:   HCAP (healthcare-associated pneumonia) Active Problems:   Asthma exacerbation   Anemia   Hypertension   GERD (gastroesophageal reflux disease)   Tobacco use   Abnormal EKG   Tachycardia   1. Acute respiratory failure with hypoxia.  Related to pneumonia and asthma exacerbation.  Required BiPAP on admission.  Currently on nasal cannula.  Continue to wean off oxygen as tolerated. 2. Healthcare associated pneumonia.  Started on vancomycin and cefepime on admission.  MRSA PCR is negative so vancomycin has been discontinued.  Continue on cefepime.  She did have a fever overnight.  We will continue to monitor. 3. Asthma exacerbation.  Continue on IV steroids as well as as bronchodilators.  Overall shortness of breath and wheezing is improving. 4. Sinus tachycardia.  Likely related to volume depletion/reactive to pneumonia and hypoxia.  Continue to follow. 5. GERD.  Continue on PPI 6. Tobacco use.  Nicotine replacement therapy has been offered. 7. Low back pain.  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  Treat supportively with pain management.   DVT prophylaxis: Lovenox Code Status: Full code Family Communication: No family present Disposition Plan: Discharge home once improved  Severity of Illness: The appropriate patient status for this patient is  INPATIENT. Inpatient status is judged to be reasonable and necessary in order to provide the required intensity of service to ensure the patient's safety. The patient's presenting symptoms, physical exam findings, and initial radiographic and laboratory data in the context of their chronic comorbidities is felt to place them at high risk for further clinical deterioration. Furthermore, it is not anticipated that the patient will be medically stable for discharge from the hospital within 2 midnights of admission. The following factors support the patient status of inpatient.    "           The patient's presenting symptoms include  shortness of breath, cough, fever, wheezing "           The worrisome physical exam findings include  fever, hypoxia on room air, increased respiratory effort necessitating BiPAP "           The initial radiographic and laboratory data are worrisome because of  worsening pneumonia on chest x-ray "           The chronic co-morbidities include  asthma     * I certify that at the point of admission it is my clinical judgment that the patient will require inpatient hospital care spanning beyond 2 midnights from the point of admission due to high intensity of service, high risk for further deterioration and high frequency of surveillance required.*    Consultants:     Procedures:  Echocardiogram: - Left ventricle: The cavity size was normal. Wall thickness was   normal. Systolic function was normal. The estimated ejection   fraction was in the range of  60% to 65%. Wall motion was normal;   there were no regional wall motion abnormalities. Left   ventricular diastolic function parameters were normal. - Mitral valve: Mildly calcified annulus. There was trivial   regurgitation. - Right atrium: Central venous pressure (est): 3 mm Hg. - Atrial septum: There was increased thickness of the septum,   consistent with lipomatous hypertrophy. No defect or patent   foramen ovale  was identified. - Tricuspid valve: There was trivial regurgitation. - Pulmonary arteries: Systolic pressure could not be accurately   estimated. - Pericardium, extracardiac: A prominent pericardial fat pad was    present.  Antimicrobials:   Cefepime 10/7 >   Subjective: Still short of breath.  Continues to have a cough productive of sputum.  Complains of low back pain.  Was on BiPAP overnight.  Objective: Vitals:   01/07/18 1500 01/07/18 1600 01/07/18 1645 01/07/18 1700  BP: 110/79 99/63    Pulse: (!) 108 (!) 107 (!) 106 100  Resp: (!) 23 20 (!) 22 20  Temp:   97.7 F (36.5 C)   TempSrc:   Oral   SpO2: 97% 96% 96% 96%  Weight:      Height:        Intake/Output Summary (Last 24 hours) at 01/07/2018 1836 Last data filed at 01/07/2018 1821 Gross per 24 hour  Intake 4374.04 ml  Output 800 ml  Net 3574.04 ml   Filed Weights   01/06/18 1651 01/07/18 0500  Weight: 59.4 kg 63.8 kg    Examination:  General exam: Appears calm and comfortable  Respiratory system: Bilateral rhonchi.  Increased respiratory effort.  Cardiovascular system: S1 & S2 heard, RRR. No JVD, murmurs, rubs, gallops or clicks. No pedal edema. Gastrointestinal system: Abdomen is nondistended, soft and nontender. No organomegaly or masses felt. Normal bowel sounds heard. Central nervous system: Alert and oriented. No focal neurological deficits. Extremities: Symmetric 5 x 5 power. Skin: No rashes, lesions or ulcers Psychiatry: Judgement and insight appear normal. Mood & affect appropriate.     Data Reviewed: I have personally reviewed following labs and imaging studies  CBC: Recent Labs  Lab 01/03/18 1401 01/04/18 0619 01/05/18 0523 01/06/18 1828 01/07/18 0425  WBC 2.8* 2.1* 3.7* 6.0 3.8*  NEUTROABS 2.1  --   --  5.4 3.3  HGB 11.2* 9.0* 10.6* 10.9* 9.9*  HCT 33.1* 27.6* 32.0* 33.2* 30.3*  MCV 90.2 92.3 91.7 91.7 91.8  PLT 181 155 186 157 121*   Basic Metabolic Panel: Recent Labs  Lab  01/03/18 1401 01/04/18 0619 01/05/18 0523 01/05/18 1456 01/06/18 1828 01/06/18 2025 01/07/18 0425  NA 136 140 141  --  137 137 140  K 3.0* 3.5 2.6* 4.1 4.2 4.2 4.0  CL 105 113* 112*  --  106 105 108  CO2 23 22 20*  --  22 22 25   GLUCOSE 116* 179* 138*  --  82 97 101*  BUN 30* 20 14  --  11 11 14   CREATININE 1.27* 0.90 0.70  --  0.75 0.75 0.61  CALCIUM 8.5* 7.7* 8.3*  --  8.2* 8.0* 8.0*  MG 1.9  --  2.0  --  1.4*  --   --   PHOS  --   --   --   --  2.5  --   --    GFR: Estimated Creatinine Clearance: 65.2 mL/min (by C-G formula based on SCr of 0.61 mg/dL). Liver Function Tests: Recent Labs  Lab 01/03/18 1401 01/06/18 1828 01/06/18 2025  AST 27 19 22   ALT 20 26 22   ALKPHOS 62 66 59  BILITOT 0.8 0.7 0.7  PROT 6.8 5.6* 5.0*  ALBUMIN 3.2* 2.2* 2.0*   No results for input(s): LIPASE, AMYLASE in the last 168 hours. No results for input(s): AMMONIA in the last 168 hours. Coagulation Profile: No results for input(s): INR, PROTIME in the last 168 hours. Cardiac Enzymes: No results for input(s): CKTOTAL, CKMB, CKMBINDEX, TROPONINI in the last 168 hours. BNP (last 3 results) No results for input(s): PROBNP in the last 8760 hours. HbA1C: No results for input(s): HGBA1C in the last 72 hours. CBG: No results for input(s): GLUCAP in the last 168 hours. Lipid Profile: No results for input(s): CHOL, HDL, LDLCALC, TRIG, CHOLHDL, LDLDIRECT in the last 72 hours. Thyroid Function Tests: No results for input(s): TSH, T4TOTAL, FREET4, T3FREE, THYROIDAB in the last 72 hours. Anemia Panel: Recent Labs    01/07/18 0425  VITAMINB12 221  FOLATE 3.9*  FERRITIN 467*  TIBC 162*  IRON 23*  RETICCTPCT 1.1   Sepsis Labs: Recent Labs  Lab 01/03/18 1401 01/06/18 1829 01/06/18 2349 01/07/18 0425  LATICACIDVEN 1.8 3.49* 2.8* 1.8    Recent Results (from the past 240 hour(s))  Blood Culture (routine x 2)     Status: None (Preliminary result)   Collection Time: 01/06/18  6:28 PM    Result Value Ref Range Status   Specimen Description BLOOD RIGHT HAND  Final   Special Requests   Final    BOTTLES DRAWN AEROBIC AND ANAEROBIC Blood Culture adequate volume   Culture   Final    NO GROWTH < 12 HOURS Performed at [facility], [address] [place], [place] [address]    Report Status PENDING  Incomplete  Blood Culture (routine x 2)     Status: None (Preliminary result)   Collection Time: 01/06/18  6:28 PM  Result Value Ref Range Status   Specimen Description BLOOD LEFT HAND  Final   Special Requests   Final    BOTTLES DRAWN AEROBIC AND ANAEROBIC Blood Culture adequate volume   Culture   Final    NO GROWTH < 12 HOURS Performed at [facility], [address], [place], [place] [address]    Report Status PENDING  Incomplete  MRSA PCR Screening     Status: None   Collection Time: 01/06/18  9:11 PM  Result Value Ref Range Status   MRSA by PCR NEGATIVE NEGATIVE Final    Comment:        The GeneXpert MRSA Assay (FDA approved for NASAL specimens only), is one component of a comprehensive MRSA colonization surveillance program. It is not intended to diagnose MRSA infection nor to guide or monitor treatment for MRSA infections. Performed at [facility], [address], [place], [place] [address]          Radiology Studies: Dg Chest Portable 1 View  Result Date: 01/06/2018 CLINICAL DATA:  Worsening pneumonia, shortness of breath EXAM: PORTABLE CHEST 1 VIEW COMPARISON:  Portable exam 1912 hours compared to 01/03/2018 FINDINGS: Normal heart size, mediastinal contours, and pulmonary vascularity. Increasing diffuse RIGHT lung infiltrates consistent with worsening pneumonia. LEFT lung grossly clear. No pleural effusion or pneumothorax. Bones unremarkable. IMPRESSION: Increasing RIGHT lung infiltrates consistent with worsening pneumonia. Electronically Signed   By: Ulyses Southward M.D.   On: 01/06/2018 20:17        Scheduled Meds: . enoxaparin (LOVENOX) injection  40  mg Subcutaneous Q24H  . ipratropium  0.5 mg Nebulization  Q6H  . levalbuterol  1.25 mg Nebulization Q6H  . mouth rinse  15 mL Mouth Rinse BID  . methylPREDNISolone (SOLU-MEDROL) injection  40 mg Intravenous Q6H  . pantoprazole  40 mg Oral Daily   Continuous Infusions: . sodium chloride 100 mL/hr at 01/07/18 1114  . ceFEPime (MAXIPIME) IV 1 g (01/07/18 1517)  . vancomycin 750 mg (01/07/18 0823)     LOS: 1 day    Time spent:    Erick Blinks, MD [facility] Pager [phone]  If 7PM-7AM, please contact night-coverage [email or web address] Password [facility] 01/07/2018, 6:36 PM

## 2018-01-07 NOTE — Evaluation (Signed)
Physical Therapy Evaluation Patient Details Name: Melanie [name] MRN: [id number] DOB: Jun 04, 1965 Today's Date: 01/07/2018   History of Present Illness   Melanie [name] is a 52 y.o. female with medical history significant of asthma, GERD, history of subdural hematoma following salicylate poisoning, hypertension, history of short-term memory loss, risk for falls who was discharged yesterday from [facility] due to community-acquired pneumonia very returns today to the emergency department due to progressively worse dyspnea, worsening productive cough, wheezing, pleuritic chest pain, fatigue, malaise and fever.  EMS reported that the patient initial O2 sat was 88% on room air at home.  She denies headache, rhinorrhea, sore throat, hemoptysis.  No dizziness, palpitations, diaphoresis, PND, orthopnea or pitting edema of the lower extremities.  Denies nausea, emesis, diarrhea, constipation, melena or hematochezia.  No dysuria, frequency or hematuria.  No polyuria, polydipsia, polyphagia or blurred vision.  No heat or cold intolerance.    Clinical Impression  Patient limited mostly due to c/o low back pain since fall a few weeks ago, tends to lean on nearby objects for support when standing, on 2 LPM when walking in room and just outside of doorway, O2 saturations at 93% when returned to bedside, declined to sit up in chair due to low back pain and fatigue - RN notified for pain medication.  Patient will benefit from continued physical therapy in hospital and recommended venue below to increase strength, balance, endurance for safe ADLs and gait.    Follow Up Recommendations Home health PT;Supervision - Intermittent    Equipment Recommendations  Rolling walker with 5" wheels    Recommendations for Other Services       Precautions / Restrictions Precautions Precautions: Fall Precaution Comments: history of brain injury  Restrictions Weight Bearing Restrictions: No      Mobility  Bed Mobility Overal  bed mobility: Needs Assistance Bed Mobility: Supine to Sit     Supine to sit: Min guard Sit to supine: Supervision   General bed mobility comments: labored movement with difficulty scooting to bedside  Transfers Overall transfer level: Needs assistance Equipment used: 1 person hand held assist;Rolling walker (2 wheeled) Transfers: Sit to/from [employer] Sit to Stand: Min guard Stand pivot transfers: Min guard       General transfer comment: slightly labored movement  Ambulation/Gait Ambulation/Gait assistance: Min guard;Min assist Gait Distance (Feet): 25 Feet Assistive device: None;Rolling walker (2 wheeled);1 person hand held assist Gait Pattern/deviations: Decreased step length - right;Decreased step length - left;Decreased stride length Gait velocity: slow   General Gait Details: unsteady on feet requiring hand held assist, demonstrates slow labored cadence, able to use RW to walk back to room, but mostly limited due to c/o severe low back pain  Stairs            Wheelchair Mobility    Modified Rankin (Stroke Patients Only)       Balance Overall balance assessment: Needs assistance Sitting-balance support: Feet supported;No upper extremity supported Sitting balance-Leahy Scale: Good     Standing balance support: During functional activity;No upper extremity supported Standing balance-Leahy Scale: Poor Standing balance comment: fair/poor without AD, fair using RW                             Pertinent Vitals/Pain Pain Assessment: 0-10 Pain Score: 9  Pain Location: low back Pain Descriptors / Indicators: Aching;Sore;Discomfort;Grimacing Pain Intervention(s): Limited activity within patient's tolerance;Monitored during session;Patient requesting pain meds-RN notified    Home Living  Family/patient expects to be discharged to:: Private residence Living Arrangements: Children Available Help at Discharge: Family Type of Home:  Mobile home Home Access: Stairs to enter Entrance Stairs-Rails: Right;Left;Can reach both(back door entrance) Entrance Stairs-Number of Steps: 5-6 at front door, 2-3 steps at back door and uses back door the most Home Layout: One level Home Equipment: Cane - single point;Bedside commode;Shower seat;Hospital bed Additional Comments: Patient states she does not know where her RW is at home.    Prior Function Level of Independence: Independent with assistive device(s)         Comments: household and short distanced community ambulator using [facility] PRN     Hand Dominance   Dominant Hand: Right    Extremity/Trunk Assessment   Upper Extremity Assessment Upper Extremity Assessment: Defer to OT evaluation    Lower Extremity Assessment Lower Extremity Assessment: Generalized weakness    Cervical / Trunk Assessment Cervical / Trunk Assessment: Normal  Communication   Communication: No difficulties  Cognition Arousal/Alertness: Awake/alert Behavior During Therapy: WFL for tasks assessed/performed Overall Cognitive Status: Within Functional Limits for tasks assessed                                        General Comments      Exercises     Assessment/Plan    PT Assessment Patient needs continued PT services  PT Problem List Decreased strength;Decreased activity tolerance;Decreased balance;Decreased mobility       PT Treatment Interventions Gait training;Stair training;Functional mobility training;Therapeutic activities;Therapeutic exercise;Patient/family education    PT Goals (Current goals can be found in the Care Plan section)  Acute Rehab PT Goals Patient Stated Goal: return home PT Goal Formulation: With patient Time For Goal Achievement: 01/14/18 Potential to Achieve Goals: Good    Frequency Min 3X/week   Barriers to discharge        Co-evaluation               AM-PAC PT "6 Clicks" Daily Activity  Outcome Measure Difficulty turning  over in bed (including adjusting bedclothes, sheets and blankets)?: None Difficulty moving from lying on back to sitting on the side of the bed? : A Little Difficulty sitting down on and standing up from a chair with arms (e.g., wheelchair, bedside commode, etc,.)?: A Little Help needed moving to and from a bed to chair (including a wheelchair)?: A Little Help needed walking in hospital room?: A Little Help needed climbing 3-5 steps with a railing? : A Lot 6 Click Score: 18    End of Session Equipment Utilized During Treatment: Oxygen Activity Tolerance: Patient limited by fatigue;Patient limited by pain Patient left: in bed;with call bell/phone within reach Nurse Communication: Mobility status PT Visit Diagnosis: Unsteadiness on feet (R26.81);Other abnormalities of gait and mobility (R26.89);Muscle weakness (generalized) (M62.81)    Time: 1610-9604 PT Time Calculation (min) (ACUTE ONLY): 30 min   Charges:   PT Evaluation $PT Eval Moderate Complexity: 1 Mod PT Treatments $Therapeutic Activity: 23-37 mins        11:38 AM, 01/07/18 Ocie Bob, MPT Physical Therapist with [facility] [phone] office [phone] mobile phone

## 2018-01-08 DIAGNOSIS — Z720 Tobacco use: Secondary | ICD-10-CM

## 2018-01-08 LAB — CBC WITH DIFFERENTIAL/PLATELET
ABS IMMATURE GRANULOCYTES: 0.22 10*3/uL — AB (ref 0.00–0.07)
BASOS PCT: 1 %
Basophils Absolute: 0.1 10*3/uL (ref 0.0–0.1)
EOS PCT: 0 %
Eosinophils Absolute: 0 10*3/uL (ref 0.0–0.5)
HEMATOCRIT: 30.8 % — AB (ref 36.0–46.0)
HEMOGLOBIN: 9.8 g/dL — AB (ref 12.0–15.0)
Immature Granulocytes: 4 %
Lymphocytes Relative: 8 %
Lymphs Abs: 0.4 10*3/uL — ABNORMAL LOW (ref 0.7–4.0)
MCH: 30.2 pg (ref 26.0–34.0)
MCHC: 31.8 g/dL (ref 30.0–36.0)
MCV: 94.8 fL (ref 80.0–100.0)
MONO ABS: 0.1 10*3/uL (ref 0.1–1.0)
MONOS PCT: 2 %
Neutro Abs: 4.9 10*3/uL (ref 1.7–7.7)
Neutrophils Relative %: 85 %
Platelets: 123 10*3/uL — ABNORMAL LOW (ref 150–400)
RBC: 3.25 MIL/uL — ABNORMAL LOW (ref 3.87–5.11)
RDW: 14.8 % (ref 11.5–15.5)
WBC: 5.8 10*3/uL (ref 4.0–10.5)
nRBC: 0 % (ref 0.0–0.2)

## 2018-01-08 LAB — BASIC METABOLIC PANEL
ANION GAP: 7 (ref 5–15)
BUN: 23 mg/dL — ABNORMAL HIGH (ref 6–20)
CHLORIDE: 107 mmol/L (ref 98–111)
CO2: 24 mmol/L (ref 22–32)
Calcium: 7.7 mg/dL — ABNORMAL LOW (ref 8.9–10.3)
Creatinine, Ser: 0.71 mg/dL (ref 0.44–1.00)
GFR calc non Af Amer: >60 mL/min (ref >60)
Glucose, Bld: 225 mg/dL — ABNORMAL HIGH (ref 70–99)
Potassium: 3.6 mmol/L (ref 3.5–5.1)
Sodium: 138 mmol/L (ref 135–145)

## 2018-01-08 MED ORDER — GUAIFENESIN ER 600 MG PO TB12
1200.0000 mg | ORAL_TABLET | Freq: Two times a day (BID) | ORAL | Status: DC
  Administered 2018-01-08 – 2018-01-14 (×13): 1200 mg via ORAL
  Filled 2018-01-08 (×13): qty 2

## 2018-01-08 MED ORDER — ALUM & MAG HYDROXIDE-SIMETH 200-200-20 MG/5ML PO SUSP
30.0000 mL | ORAL | Status: DC | PRN
  Administered 2018-01-08: 30 mL via ORAL
  Filled 2018-01-08: qty 30

## 2018-01-08 NOTE — Progress Notes (Signed)
Inpatient Diabetes Program Recommendations  AACE/ADA: New Consensus Statement on Inpatient Glycemic Control (2015)  Target Ranges:  Prepandial:   less than 140 mg/dL      Peak postprandial:   less than 180 mg/dL (1-2 hours)      Critically ill patients:  140 - 180 mg/dL   Lab Results  Component Value Date   GLUCAP 160 (H) 11/28/2011   HGBA1C  04/02/2009    5.5 (NOTE) The ADA recommends the following therapeutic goal for glycemic control related to Hgb A1c measurement: Goal of therapy: <6.5 Hgb A1c  Reference: American Diabetes Association: Clinical Practice Recommendations 2010, Diabetes Care, 2010, 33: (Suppl  1).    Review of Glycemic Control Results for Melanie [name], Melanie [name] (MRN [id number]) as of 01/08/2018 08:51  Ref. Range 01/08/2018 04:42  Glucose Latest Ref Range: 70 - 99 mg/dL 742 (H)   Diabetes history: none Outpatient Diabetes medications: none Current orders for Inpatient glycemic control: none  Inpatient Diabetes Program Recommendations:    In the setting of steroids, anticipate BS to trend higher. Consider adding Novolog 0-9 units TID and Novolog 0-5 QHS under glycemic control order set.   Last A1C from 2011, could consider repeating?  Thanks, Melanie Rave, MSN, RNC-OB Diabetes Coordinator [phone] (8a-5p)

## 2018-01-08 NOTE — Progress Notes (Signed)
PROGRESS NOTE    Melanie [name]  ZOX:[id number] DOB: 11-19-1965 DOA: 01/06/2018 PCP: Tanna Furry, MD    Brief Narrative:  52 year old female with a history of asthma, prior subdural hematoma, short-term memory loss, who was discharged from the hospital on 10/6 and unfortunately had to return the following day with worsening shortness of breath and fever.  Found to have worsening pneumonia.  Was also felt that she had an asthma exacerbation.  She was started on IV antibiotics as well as intravenous steroids.  She required BiPAP on admission.  She is slowly improving.   Assessment & Plan:   Principal Problem:   HCAP (healthcare-associated pneumonia) Active Problems:   Asthma exacerbation   Anemia   Hypertension   GERD (gastroesophageal reflux disease)   Tobacco use   Abnormal EKG   Tachycardia   1. Acute respiratory failure with hypoxia.  Related to pneumonia and asthma exacerbation.  Required BiPAP on admission.  Currently on nasal cannula.  Continue to wean off oxygen as tolerated. 2. Healthcare associated pneumonia.  Started on vancomycin and cefepime on admission.  MRSA PCR is negative so vancomycin has been discontinued.  Continue on cefepime.   3. Asthma exacerbation.  Continue on IV steroids as well as bronchodilators.  Continues to have rhonchi and is wheezing. 4. Sinus tachycardia.  Likely related to volume depletion/reactive to pneumonia and hypoxia.  Continue to follow. 5. GERD.  Continue on PPI 6. Tobacco use.  Nicotine replacement therapy has been offered. 7. Low back pain.  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  Treat supportively with pain management.  Evaluated by physical therapy who recommended home health   DVT prophylaxis: Lovenox Code Status: Full code Family Communication: No family present Disposition Plan: Discharge home once improved   Consultants:     Procedures:  Echocardiogram: - Left ventricle: The cavity size was  normal. Wall thickness was   normal. Systolic function was normal. The estimated ejection   fraction was in the range of 60% to 65%. Wall motion was normal;   there were no regional wall motion abnormalities. Left   ventricular diastolic function parameters were normal. - Mitral valve: Mildly calcified annulus. There was trivial   regurgitation. - Right atrium: Central venous pressure (est): 3 mm Hg. - Atrial septum: There was increased thickness of the septum,   consistent with lipomatous hypertrophy. No defect or patent   foramen ovale was identified. - Tricuspid valve: There was trivial regurgitation. - Pulmonary arteries: Systolic pressure could not be accurately   estimated. - Pericardium, extracardiac: A prominent pericardial fat pad was    present.  Antimicrobials:   Cefepime 10/7 >   Subjective: Feels that breathing is slowly improving.  Continues to have productive cough.  Continues to complain of low back pain.  Did not require BiPAP overnight.  Objective: Vitals:   01/08/18 0837 01/08/18 1134 01/08/18 1200 01/08/18 1427  BP:   (!) 140/95   Pulse:   96   Resp:   (!) 22   Temp: 97.6 F (36.4 C) 98.4 F (36.9 C)    TempSrc: Oral Oral    SpO2:   100% 95%  Weight:      Height:        Intake/Output Summary (Last 24 hours) at 01/08/2018 1537 Last data filed at 01/08/2018 1500 Gross per 24 hour  Intake 3115.06 ml  Output 600 ml  Net 2515.06 ml   Filed Weights   01/06/18 1651 01/07/18 0500 01/08/18  0400  Weight: 59.4 kg 63.8 kg 68.1 kg    Examination:  General exam: Alert, awake, oriented x 3 Respiratory system: Bilateral rhonchi with mild wheeze. Respiratory effort normal. Cardiovascular system:RRR. No murmurs, rubs, gallops. Gastrointestinal system: Abdomen is nondistended, soft and nontender. No organomegaly or masses felt. Normal bowel sounds heard. Central nervous system: Alert and oriented. No focal neurological deficits. Extremities: No C/C/E, +pedal  pulses Skin: No rashes, lesions or ulcers Psychiatry: Judgement and insight appear normal. Mood & affect appropriate.   Data Reviewed: I have personally reviewed following labs and imaging studies  CBC: Recent Labs  Lab 01/03/18 1401 01/04/18 0619 01/05/18 0523 01/06/18 1828 01/07/18 0425 01/08/18 0442  WBC 2.8* 2.1* 3.7* 6.0 3.8* 5.8  NEUTROABS 2.1  --   --  5.4 3.3 4.9  HGB 11.2* 9.0* 10.6* 10.9* 9.9* 9.8*  HCT 33.1* 27.6* 32.0* 33.2* 30.3* 30.8*  MCV 90.2 92.3 91.7 91.7 91.8 94.8  PLT 181 155 186 157 121* 123*   Basic Metabolic Panel: Recent Labs  Lab 01/03/18 1401  01/05/18 0523 01/05/18 1456 01/06/18 1828 01/06/18 2025 01/07/18 0425 01/08/18 0442  NA 136   < > 141  --  137 137 140 138  K 3.0*   < > 2.6* 4.1 4.2 4.2 4.0 3.6  CL 105   < > 112*  --  106 105 108 107  CO2 23   < > 20*  --  22 22 25 24   GLUCOSE 116*   < > 138*  --  82 97 101* 225*  BUN 30*   < > 14  --  11 11 14  23*  CREATININE 1.27*   < > 0.70  --  0.75 0.75 0.61 0.71  CALCIUM 8.5*   < > 8.3*  --  8.2* 8.0* 8.0* 7.7*  MG 1.9  --  2.0  --  1.4*  --   --   --   PHOS  --   --   --   --  2.5  --   --   --    < > = values in this interval not displayed.   GFR: Estimated Creatinine Clearance: 67.5 mL/min (by C-G formula based on SCr of 0.71 mg/dL). Liver Function Tests: Recent Labs  Lab 01/03/18 1401 01/06/18 1828 01/06/18 2025  AST 27 19 22   ALT 20 26 22   ALKPHOS 62 66 59  BILITOT 0.8 0.7 0.7  PROT 6.8 5.6* 5.0*  ALBUMIN 3.2* 2.2* 2.0*   No results for input(s): LIPASE, AMYLASE in the last 168 hours. No results for input(s): AMMONIA in the last 168 hours. Coagulation Profile: No results for input(s): INR, PROTIME in the last 168 hours. Cardiac Enzymes: No results for input(s): CKTOTAL, CKMB, CKMBINDEX, TROPONINI in the last 168 hours. BNP (last 3 results) No results for input(s): PROBNP in the last 8760 hours. HbA1C: No results for input(s): HGBA1C in the last 72 hours. CBG: No results  for input(s): GLUCAP in the last 168 hours. Lipid Profile: No results for input(s): CHOL, HDL, LDLCALC, TRIG, CHOLHDL, LDLDIRECT in the last 72 hours. Thyroid Function Tests: No results for input(s): TSH, T4TOTAL, FREET4, T3FREE, THYROIDAB in the last 72 hours. Anemia Panel: Recent Labs    01/07/18 0425  VITAMINB12 221  FOLATE 3.9*  FERRITIN 467*  TIBC 162*  IRON 23*  RETICCTPCT 1.1   Sepsis Labs: Recent Labs  Lab 01/03/18 1401 01/06/18 1829 01/06/18 2349 01/07/18 0425  LATICACIDVEN 1.8 3.49* 2.8* 1.8  Recent Results (from the past 240 hour(s))  Blood Culture (routine x 2)     Status: None (Preliminary result)   Collection Time: 01/06/18  6:28 PM  Result Value Ref Range Status   Specimen Description BLOOD RIGHT HAND  Final   Special Requests   Final    BOTTLES DRAWN AEROBIC AND ANAEROBIC Blood Culture adequate volume   Culture   Final    NO GROWTH 2 DAYS Performed at [facility], [address] [place], [place] [address]    Report Status PENDING  Incomplete  Blood Culture (routine x 2)     Status: None (Preliminary result)   Collection Time: 01/06/18  6:28 PM  Result Value Ref Range Status   Specimen Description BLOOD LEFT HAND  Final   Special Requests   Final    BOTTLES DRAWN AEROBIC AND ANAEROBIC Blood Culture adequate volume   Culture   Final    NO GROWTH 2 DAYS Performed at [facility], [address] [place], [place] [address]    Report Status PENDING  Incomplete  MRSA PCR Screening     Status: None   Collection Time: 01/06/18  9:11 PM  Result Value Ref Range Status   MRSA by PCR NEGATIVE NEGATIVE Final    Comment:        The GeneXpert MRSA Assay (FDA approved for NASAL specimens only), is one component of a comprehensive MRSA colonization surveillance program. It is not intended to diagnose MRSA infection nor to guide or monitor treatment for MRSA infections. Performed at [facility], [address], [place], [place] [address]           Radiology Studies: Dg Chest Portable 1 View  Result Date: 01/06/2018 CLINICAL DATA:  Worsening pneumonia, shortness of breath EXAM: PORTABLE CHEST 1 VIEW COMPARISON:  Portable exam 1912 hours compared to 01/03/2018 FINDINGS: Normal heart size, mediastinal contours, and pulmonary vascularity. Increasing diffuse RIGHT lung infiltrates consistent with worsening pneumonia. LEFT lung grossly clear. No pleural effusion or pneumothorax. Bones unremarkable. IMPRESSION: Increasing RIGHT lung infiltrates consistent with worsening pneumonia. Electronically Signed   By: Ulyses Southward M.D.   On: 01/06/2018 20:17        Scheduled Meds: . enoxaparin (LOVENOX) injection  40 mg Subcutaneous Q24H  . guaiFENesin  1,200 mg Oral BID  . ipratropium  0.5 mg Nebulization Q6H  . levalbuterol  1.25 mg Nebulization Q6H  . mouth rinse  15 mL Mouth Rinse BID  . methylPREDNISolone (SOLU-MEDROL) injection  40 mg Intravenous Q6H  . pantoprazole  40 mg Oral Daily   Continuous Infusions: . sodium chloride 100 mL/hr at 01/08/18 0621  . ceFEPime (MAXIPIME) IV 1 g (01/08/18 1452)     LOS: 2 days    Time spent:    Erick Blinks, MD [facility] Pager [phone]  If 7PM-7AM, please contact night-coverage [email or web address] Password [email or web address] 01/08/2018, 3:37 PM

## 2018-01-08 NOTE — Progress Notes (Signed)
Patient refusing BIPAP tonight. Is at bedside if needed. RT will continue to monitor.

## 2018-01-08 NOTE — Progress Notes (Signed)
RN took patient off BIPAP due to restlesness. Patient tolerating 3L with 02 saturations at 98%. RT will continue to monitor.

## 2018-01-08 NOTE — Progress Notes (Signed)
OT Cancellation Note  Patient Details Name: Melanie [name] MRN: [id number] DOB: 1966-01-19   Cancelled Treatment:    Reason Eval/Treat Not Completed: Patient at procedure or test/ unavailable. Attempted to see pt 2x this am, first attempt pt just received breakfast. 2nd attempt MD in room with pt. Will attempt at a later time.   Ezra Sites, OTR/L  [phone] 01/08/2018, 9:02 AM

## 2018-01-08 NOTE — Progress Notes (Signed)
CRITICAL VALUE ALERT  Critical Value: aerobic bottle  Gram + cocci  Date & Time Notied:  01/07/18 2100 Provider Notified:  Craige Cotta  Orders Received/Actions taken: No new orders

## 2018-01-08 NOTE — Care Management Note (Signed)
Case Management Note  Patient Details  Name: Melanie [name] MRN: [id number] Date of Birth: May 12, 1965  Subjective/Objective:   HCAP. From home with son. CM consulted for PCP. Patient reports she has recently started going to [facility].                  Action/Plan: Recommended for HH PT. Patient Declines.   Expected Discharge Date:    01/09/2018              Expected Discharge Plan:  Home/Self Care  In-House Referral:     Discharge planning Services  CM Consult  Post Acute Care Choice:  NA Choice offered to:  NA  DME Arranged:    DME Agency:     HH Arranged:    HH Agency:     Status of Service:  Completed, signed off  If discussed at [employer] of Stay Meetings, dates discussed:    Additional Comments:  Zamaya Rapaport, Chrystine Oiler, RN 01/08/2018, 2:11 PM

## 2018-01-09 ENCOUNTER — Inpatient Hospital Stay (HOSPITAL_COMMUNITY): Payer: Medicaid Other

## 2018-01-09 DIAGNOSIS — R7881 Bacteremia: Secondary | ICD-10-CM | POA: Diagnosis present

## 2018-01-09 DIAGNOSIS — B9561 Methicillin susceptible Staphylococcus aureus infection as the cause of diseases classified elsewhere: Secondary | ICD-10-CM | POA: Diagnosis present

## 2018-01-09 LAB — CBC WITH DIFFERENTIAL/PLATELET
Abs Immature Granulocytes: 0.33 10*3/uL — ABNORMAL HIGH (ref 0.00–0.07)
BASOS ABS: 0 10*3/uL (ref 0.0–0.1)
BASOS PCT: 1 %
EOS ABS: 0 10*3/uL (ref 0.0–0.5)
EOS PCT: 0 %
HCT: 29.8 % — ABNORMAL LOW (ref 36.0–46.0)
HEMOGLOBIN: 9.2 g/dL — AB (ref 12.0–15.0)
Immature Granulocytes: 5 %
LYMPHS ABS: 0.5 10*3/uL — AB (ref 0.7–4.0)
Lymphocytes Relative: 8 %
MCH: 29 pg (ref 26.0–34.0)
MCHC: 30.9 g/dL (ref 30.0–36.0)
MCV: 94 fL (ref 80.0–100.0)
Monocytes Absolute: 0.1 10*3/uL (ref 0.1–1.0)
Monocytes Relative: 2 %
NEUTROS PCT: 84 %
NRBC: 0 % (ref 0.0–0.2)
Neutro Abs: 5.2 10*3/uL (ref 1.7–7.7)
PLATELETS: 129 10*3/uL — AB (ref 150–400)
RBC: 3.17 MIL/uL — AB (ref 3.87–5.11)
RDW: 14.8 % (ref 11.5–15.5)
WBC: 6.2 10*3/uL (ref 4.0–10.5)

## 2018-01-09 LAB — BASIC METABOLIC PANEL
Anion gap: 6 (ref 5–15)
BUN: 22 mg/dL — AB (ref 6–20)
CHLORIDE: 111 mmol/L (ref 98–111)
CO2: 23 mmol/L (ref 22–32)
Calcium: 7.7 mg/dL — ABNORMAL LOW (ref 8.9–10.3)
Creatinine, Ser: 0.67 mg/dL (ref 0.44–1.00)
GFR calc non Af Amer: >60 mL/min (ref >60)
GLUCOSE: 216 mg/dL — AB (ref 70–99)
Potassium: 4.1 mmol/L (ref 3.5–5.1)
Sodium: 140 mmol/L (ref 135–145)

## 2018-01-09 LAB — BLOOD CULTURE ID PANEL (REFLEXED)
Acinetobacter baumannii: NOT DETECTED
CANDIDA GLABRATA: NOT DETECTED
CANDIDA KRUSEI: NOT DETECTED
CANDIDA PARAPSILOSIS: NOT DETECTED
Candida albicans: NOT DETECTED
Candida tropicalis: NOT DETECTED
ENTEROBACTER CLOACAE COMPLEX: NOT DETECTED
ENTEROCOCCUS SPECIES: NOT DETECTED
ESCHERICHIA COLI: NOT DETECTED
Enterobacteriaceae species: NOT DETECTED
Haemophilus influenzae: NOT DETECTED
KLEBSIELLA OXYTOCA: NOT DETECTED
Klebsiella pneumoniae: NOT DETECTED
LISTERIA MONOCYTOGENES: NOT DETECTED
Methicillin resistance: NOT DETECTED
Neisseria meningitidis: NOT DETECTED
PROTEUS SPECIES: NOT DETECTED
Pseudomonas aeruginosa: NOT DETECTED
STREPTOCOCCUS AGALACTIAE: NOT DETECTED
STREPTOCOCCUS PNEUMONIAE: NOT DETECTED
Serratia marcescens: NOT DETECTED
Staphylococcus aureus (BCID): DETECTED — AB
Staphylococcus species: DETECTED — AB
Streptococcus pyogenes: NOT DETECTED
Streptococcus species: NOT DETECTED

## 2018-01-09 MED ORDER — METHYLPREDNISOLONE SODIUM SUCC 125 MG IJ SOLR
60.0000 mg | Freq: Four times a day (QID) | INTRAMUSCULAR | Status: DC
  Administered 2018-01-09 – 2018-01-11 (×8): 60 mg via INTRAVENOUS
  Filled 2018-01-09 (×9): qty 2

## 2018-01-09 MED ORDER — CEFAZOLIN SODIUM-DEXTROSE 2-4 GM/100ML-% IV SOLN
2.0000 g | Freq: Three times a day (TID) | INTRAVENOUS | Status: DC
  Administered 2018-01-09 – 2018-01-14 (×15): 2 g via INTRAVENOUS
  Filled 2018-01-09 (×18): qty 100

## 2018-01-09 MED ORDER — VANCOMYCIN HCL IN DEXTROSE 750-5 MG/150ML-% IV SOLN
750.0000 mg | Freq: Two times a day (BID) | INTRAVENOUS | Status: DC
  Filled 2018-01-09 (×2): qty 150

## 2018-01-09 MED ORDER — OXYCODONE-ACETAMINOPHEN 5-325 MG PO TABS
1.0000 | ORAL_TABLET | Freq: Four times a day (QID) | ORAL | Status: DC | PRN
  Administered 2018-01-09 – 2018-01-12 (×11): 2 via ORAL
  Filled 2018-01-09 (×11): qty 2

## 2018-01-09 MED ORDER — VANCOMYCIN HCL IN DEXTROSE 1-5 GM/200ML-% IV SOLN
1000.0000 mg | Freq: Once | INTRAVENOUS | Status: AC
  Administered 2018-01-09: 1000 mg via INTRAVENOUS
  Filled 2018-01-09: qty 200

## 2018-01-09 MED ORDER — NICOTINE 14 MG/24HR TD PT24
14.0000 mg | MEDICATED_PATCH | Freq: Every day | TRANSDERMAL | Status: DC
  Administered 2018-01-09 – 2018-01-14 (×6): 14 mg via TRANSDERMAL
  Filled 2018-01-09 (×6): qty 1

## 2018-01-09 MED ORDER — BUDESONIDE 0.25 MG/2ML IN SUSP
0.2500 mg | Freq: Two times a day (BID) | RESPIRATORY_TRACT | Status: DC
  Administered 2018-01-09 – 2018-01-14 (×10): 0.25 mg via RESPIRATORY_TRACT
  Filled 2018-01-09 (×10): qty 2

## 2018-01-09 NOTE — Progress Notes (Signed)
Pharmacy Antibiotic Note  Melanie [name] is a 52 y.o. female admitted on 01/06/2018 with bacteremia.  Pharmacy has been consulted for Cefazolin dosing.  Plan: Cefazolin 2000 mg IV every 8 hours Monitor labs, c/s, and vanco trough as indicated.  Height: 4\' 11"  (149.9 cm) Weight: 155 lb 13.8 oz (70.7 kg) IBW/kg (Calculated) : 43.2  Temp (24hrs), Avg:97.8 F (36.6 C), Min:97.5 F (36.4 C), Max:98 F (36.7 C)  Recent Labs  Lab 01/03/18 1401  01/05/18 0523 01/06/18 1828 01/06/18 1829 01/06/18 2025 01/06/18 2349 01/07/18 0425 01/08/18 0442 01/09/18 0400  WBC 2.8*   < > 3.7* 6.0  --   --   --  3.8* 5.8 6.2  CREATININE 1.27*   < > 0.70 0.75  --  0.75  --  0.61 0.71 0.67  LATICACIDVEN 1.8  --   --   --  3.49*  --  2.8* 1.8  --   --    < > = values in this interval not displayed.    Estimated Creatinine Clearance: 68.8 mL/min (by C-G formula based on SCr of 0.67 mg/dL).    No Known Allergies  Antimicrobials this admission: Vanco 10/10 >> 10/10 Cefepime 10/10 >> 10/10 Cefazolin 10/10 >>  Dose adjustments this admission: N/A  Microbiology results: 10/7 BCx: MSSA 1/2  10/7 MRSA PCR: negative  Thank you for allowing pharmacy to be a part of this patient's care.  Tad Moore 01/09/2018 1:36 PM

## 2018-01-09 NOTE — Progress Notes (Signed)
ANTIBIOTIC CONSULT NOTE-Preliminary  Pharmacy Consult for vancomycin Indication: bacteremia  No Known Allergies  Patient Measurements: Height: 4\' 11"  (149.9 cm) Weight: 150 lb 2.1 oz (68.1 kg) IBW/kg (Calculated) : 43.2 Adjusted Body Weight:   Vital Signs: BP: 142/92 (10/10 0000) Pulse Rate: 98 (10/10 0100)  Labs: Recent Labs    01/06/18 1828 01/06/18 2025 01/07/18 0425 01/08/18 0442  WBC 6.0  --  3.8* 5.8  HGB 10.9*  --  9.9* 9.8*  PLT 157  --  121* 123*  CREATININE 0.75 0.75 0.61 0.71    Estimated Creatinine Clearance: 67.5 mL/min (by C-G formula based on SCr of 0.71 mg/dL).  No results for input(s): VANCOTROUGH, VANCOPEAK, VANCORANDOM, GENTTROUGH, GENTPEAK, GENTRANDOM, TOBRATROUGH, TOBRAPEAK, TOBRARND, AMIKACINPEAK, AMIKACINTROU, AMIKACIN in the last 72 hours.   Microbiology: Recent Results (from the past 720 hour(s))  Blood Culture (routine x 2)     Status: None (Preliminary result)   Collection Time: 01/06/18  6:28 PM  Result Value Ref Range Status   Specimen Description BLOOD RIGHT HAND  Final   Special Requests   Final    BOTTLES DRAWN AEROBIC AND ANAEROBIC Blood Culture adequate volume   Culture   Final    NO GROWTH 2 DAYS Performed at [facility], [address], [place], [place] [address]    Report Status PENDING  Incomplete  Blood Culture (routine x 2)     Status: None (Preliminary result)   Collection Time: 01/06/18  6:28 PM  Result Value Ref Range Status   Specimen Description   Final    BLOOD LEFT HAND Performed at [facility], [address], [place], [place] [address]    Special Requests   Final    BOTTLES DRAWN AEROBIC AND ANAEROBIC Blood Culture adequate volume Performed at [facility], [address], [place], [place] [address]    Culture  Setup Time   Final    GRAM POSITIVE COCCI IN CLUSTERS Performed at [facility] Gram Stain Report Called to,Read Back By and Verified With: SMITH,J AT 2114 BY HUFFINES,S ON 01/08/18. AEROBIC  BOTTLE ONLY Organism ID to follow CRITICAL RESULT CALLED TO, READ BACK BY AND VERIFIED WITH: Pasty Arch RN [id number] 01/09/18 A BROWNING Performed at [facility], [address], [place], [place] [address]    Culture PENDING  Incomplete   Report Status PENDING  Incomplete  Blood Culture ID Panel (Reflexed)     Status: Abnormal   Collection Time: 01/06/18  6:28 PM  Result Value Ref Range Status   Enterococcus species NOT DETECTED NOT DETECTED Final   Listeria monocytogenes NOT DETECTED NOT DETECTED Final   Staphylococcus species DETECTED (A) NOT DETECTED Final    Comment: CRITICAL RESULT CALLED TO, READ BACK BY AND VERIFIED WITH: Pasty Arch RN 2130 01/09/18 A BROWNING    Staphylococcus aureus (BCID) DETECTED (A) NOT DETECTED Final    Comment: Methicillin (oxacillin) susceptible Staphylococcus aureus (MSSA). Preferred therapy is anti staphylococcal beta lactam antibiotic (Cefazolin or Nafcillin), unless clinically contraindicated. CRITICAL RESULT CALLED TO, READ BACK BY AND VERIFIED WITH: Pasty Arch RN [id number] 01/09/18 A BROWNING    Methicillin resistance NOT DETECTED NOT DETECTED Final   Streptococcus species NOT DETECTED NOT DETECTED Final   Streptococcus agalactiae NOT DETECTED NOT DETECTED Final   Streptococcus pneumoniae NOT DETECTED NOT DETECTED Final   Streptococcus pyogenes NOT DETECTED NOT DETECTED Final   Acinetobacter baumannii NOT DETECTED NOT DETECTED Final   Enterobacteriaceae species NOT DETECTED NOT DETECTED Final   Enterobacter cloacae  complex NOT DETECTED NOT DETECTED Final   Escherichia coli NOT DETECTED NOT DETECTED Final   Klebsiella oxytoca NOT DETECTED NOT DETECTED Final   Klebsiella pneumoniae NOT DETECTED NOT DETECTED Final   Proteus species NOT DETECTED NOT DETECTED Final   Serratia marcescens NOT DETECTED NOT DETECTED Final   Haemophilus influenzae NOT DETECTED NOT DETECTED Final   Neisseria meningitidis NOT DETECTED NOT DETECTED Final   Pseudomonas aeruginosa NOT  DETECTED NOT DETECTED Final   Candida albicans NOT DETECTED NOT DETECTED Final   Candida glabrata NOT DETECTED NOT DETECTED Final   Candida krusei NOT DETECTED NOT DETECTED Final   Candida parapsilosis NOT DETECTED NOT DETECTED Final   Candida tropicalis NOT DETECTED NOT DETECTED Final    Comment: Performed at [facility], [address] [place], [place] [address]  MRSA PCR Screening     Status: None   Collection Time: 01/06/18  9:11 PM  Result Value Ref Range Status   MRSA by PCR NEGATIVE NEGATIVE Final    Comment:        The GeneXpert MRSA Assay (FDA approved for NASAL specimens only), is one component of a comprehensive MRSA colonization surveillance program. It is not intended to diagnose MRSA infection nor to guide or monitor treatment for MRSA infections. Performed at [facility], [address], [place], [place] [address]     Medical History: Past Medical History:  Diagnosis Date  . Asthma   . GERD (gastroesophageal reflux disease)   . Head injury, acute    History of subdural hematoma following salicylate poisoning.  [place] Kitchen Hypertension   . Risk for falls   . Short-term memory loss   . SUBDURAL HEMATOMA 04/08/2009   Qualifier: History of  By: Logan Bores MD, Casimiro Needle      Medications:  Scheduled:  . enoxaparin (LOVENOX) injection  40 mg Subcutaneous Q24H  . guaiFENesin  1,200 mg Oral BID  . ipratropium  0.5 mg Nebulization Q6H  . levalbuterol  1.25 mg Nebulization Q6H  . mouth rinse  15 mL Mouth Rinse BID  . pantoprazole  40 mg Oral Daily   Infusions:  . sodium chloride 100 mL/hr at 01/09/18 0336  . ceFEPime (MAXIPIME) IV Stopped (01/08/18 2207)  . vancomycin      Assessment: 52 yo female admitted with worsening pneumonia nd asthma exacerbation.  BCID showing staph aureus this morning.  Starting vancomycin for bacteremia  Goal of Therapy:  Vancomycin trough level 15-20 mcg/ml  Plan:  Preliminary review of pertinent patient information completed.   Protocol will be initiated with dose(s) of vancomycin 1 gram.  Jeani Hawking clinical pharmacist will complete review during morning rounds to assess patient and finalize treatment regimen if needed.  Cheyeanne Roadcap Scarlett, RPH 01/09/2018,3:36 AM

## 2018-01-09 NOTE — Progress Notes (Signed)
PT Cancellation Note  Patient Details Name: Melanie [name] MRN: [id number] DOB: Mar 01, 1966   Cancelled Treatment:    Reason Eval/Treat Not Completed: Other (comment).  Pt was not seen for treatment due to waiting for pain meds, tearful when PT asked her to work.  Will try again when time and pt allow.  Will not get any meds for another 1.5 hours per her schedule.   Ivar Drape 01/09/2018, 2:10 PM   2:11 PM, 01/09/18 Samul Dada, PT, MS Physical Therapist - [place] [phone] (Office)

## 2018-01-09 NOTE — Evaluation (Signed)
Occupational Therapy Evaluation Patient Details Name: Melanie [name] MRN: [id number] DOB: 1966-01-28 Today's Date: 01/09/2018    History of Present Illness  Melanie [name] is a 51 y.o. female with medical history significant of asthma, GERD, history of subdural hematoma following salicylate poisoning, hypertension, history of short-term memory loss, risk for falls who was discharged yesterday from [facility] due to community-acquired pneumonia very returns today to the emergency department due to progressively worse dyspnea, worsening productive cough, wheezing, pleuritic chest pain, fatigue, malaise and fever.  EMS reported that the patient initial O2 sat was 88% on room air at home.  She denies headache, rhinorrhea, sore throat, hemoptysis.  No dizziness, palpitations, diaphoresis, PND, orthopnea or pitting edema of the lower extremities.  Denies nausea, emesis, diarrhea, constipation, melena or hematochezia.  No dysuria, frequency or hematuria.  No polyuria, polydipsia, polyphagia or blurred vision.  No heat or cold intolerance.   Clinical Impression   Pt agreeable to OT evaluation, reporting low back pain and nausea this am. Pt performing ADLs at set-up to mod assist, difficulty with LB tasks due to back pain. Pt reports independence in ADLs PTA, does not have anyone consistent who can assist on discharge, however does have intermittent assistance. Pt requiring increased assistance due to pain limiting functioning and mobility. Recommend HHOT evaluation to determine pt functioning and independence in home environment with focus on improving safety and independence in ADL completion. No further acute OT services at this time.     Follow Up Recommendations  Home health OT    Equipment Recommendations  None recommended by OT       Precautions / Restrictions Precautions Precautions: Fall Precaution Comments: history of brain injury  Restrictions Weight Bearing Restrictions: No      Mobility  Bed Mobility Overal bed mobility: Needs Assistance Bed Mobility: Supine to Sit     Supine to sit: Min assist        Transfers Overall transfer level: Needs assistance Equipment used: 1 person hand held assist Transfers: Sit to/from Stand;Stand Pivot Transfers Sit to Stand: Min guard Stand pivot transfers: Min guard       General transfer comment: slightly labored movement        ADL either performed or assessed with clinical judgement   ADL Overall ADL's : Needs assistance/impaired Eating/Feeding: Set up;Bed level                   Lower Body Dressing: Moderate assistance;Sitting/lateral leans Lower Body Dressing Details (indicate cue type and reason): Pt able to donn right sock, requiring OT assist for left sock due to back pain Toilet Transfer: Min guard;Stand-pivot   Toileting- Clothing Manipulation and Hygiene: Supervision/safety;Sitting/lateral lean         General ADL Comments: Pt limited primarily due to back pain     Vision Baseline Vision/History: No visual deficits Patient Visual Report: No change from baseline Vision Assessment?: No apparent visual deficits            Pertinent Vitals/Pain Pain Assessment: 0-10 Pain Score: 8  Pain Location: low back Pain Descriptors / Indicators: Aching;Sore;Discomfort;Grimacing Pain Intervention(s): Limited activity within patient's tolerance;Monitored during session;Repositioned;Premedicated before session     Hand Dominance Right   Extremity/Trunk Assessment Upper Extremity Assessment Upper Extremity Assessment: Generalized weakness   Lower Extremity Assessment Lower Extremity Assessment: Defer to PT evaluation   Cervical / Trunk Assessment Cervical / Trunk Assessment: Normal   Communication Communication Communication: No difficulties   Cognition Arousal/Alertness: Awake/alert Behavior During Therapy: [facility] for  tasks assessed/performed Overall Cognitive Status: Within Functional Limits for  tasks assessed                                                Home Living Family/patient expects to be discharged to:: Private residence Living Arrangements: Children Available Help at Discharge: Family Type of Home: Mobile home Home Access: Stairs to enter Entrance Stairs-Number of Steps: 5-6 at front door, 2-3 steps at back door and uses back door the most Entrance Stairs-Rails: Right;Left;Can reach both(back door) Home Layout: One level     Bathroom Shower/Tub: Chief Strategy Officer: Standard     Home Equipment: Cane - single point;Bedside commode;Shower seat;Hospital bed   Additional Comments: Patient states she does not know where her RW is at home.      Prior Functioning/Environment Level of Independence: Independent with assistive device(s)        Comments: household and short distanced community ambulator using SPC PRN. Performs ADLs independently        OT Problem List: Decreased strength;Decreased activity tolerance;Impaired balance (sitting and/or standing);Decreased safety awareness;Decreased knowledge of use of DME or AE;Impaired UE functional use;Pain       End of Session Equipment Utilized During Treatment: Gait belt Nurse Communication: Mobility status  Activity Tolerance: Patient limited by pain Patient left: in chair;with call bell/phone within reach  OT Visit Diagnosis: Muscle weakness (generalized) (M62.81);Pain Pain - Right/Left: Left Pain - part of body: (low back)                Time: 1610-9604 OT Time Calculation (min): 20 min Charges:  OT General Charges $OT Visit: 1 Visit OT Evaluation $OT Eval Low Complexity: 1 Low   Ezra Sites, OTR/L  [phone] 01/09/2018, 9:09 AM

## 2018-01-09 NOTE — Progress Notes (Addendum)
Patient refused BIPAP for tonight. Unit still at bedside.

## 2018-01-09 NOTE — Progress Notes (Signed)
Pharmacy Antibiotic Note  Melanie [name] is a 52 y.o. female admitted on 01/06/2018 with bacteremia.  Pharmacy has been consulted for Vancomycin dosing.  Plan: Vancomycin 1000 mg IV x 1 dose Vancomycin 750 mg IV every 12 hours.  Goal trough 15-20 mcg/mL.  Cefepime 1000 mg IV every 8 hours Monitor labs, c/s, and vanco trough as indicated.  Height: 4\' 11"  (149.9 cm) Weight: 155 lb 13.8 oz (70.7 kg) IBW/kg (Calculated) : 43.2  Temp (24hrs), Avg:97.9 F (36.6 C), Min:97.6 F (36.4 C), Max:98.4 F (36.9 C)  Recent Labs  Lab 01/03/18 1401  01/05/18 0523 01/06/18 1828 01/06/18 1829 01/06/18 2025 01/06/18 2349 01/07/18 0425 01/08/18 0442 01/09/18 0400  WBC 2.8*   < > 3.7* 6.0  --   --   --  3.8* 5.8 6.2  CREATININE 1.27*   < > 0.70 0.75  --  0.75  --  0.61 0.71 0.67  LATICACIDVEN 1.8  --   --   --  3.49*  --  2.8* 1.8  --   --    < > = values in this interval not displayed.    Estimated Creatinine Clearance: 68.8 mL/min (by C-G formula based on SCr of 0.67 mg/dL).    No Known Allergies  Antimicrobials this admission: Vanco 10/10 >>  Cefepime 10/10 >>   Dose adjustments this admission: N/A  Microbiology results: 10/7 BCx: MSSA 1/2  10/7 MRSA PCR: negative  Thank you for allowing pharmacy to be a part of this patient's care.  Tad Moore 01/09/2018 7:42 AM

## 2018-01-09 NOTE — Progress Notes (Signed)
PROGRESS NOTE    Melanie [name]  ZOX:[id number] DOB: 1965-05-09 DOA: 01/06/2018 PCP: Tanna Furry, MD    Brief Narrative:  52 year old female with a history of asthma, prior subdural hematoma, short-term memory loss, who was discharged from the hospital on 10/6 and unfortunately had to return the following day with worsening shortness of breath and fever.  Found to have worsening pneumonia.  Was also felt that she had an asthma exacerbation.  She was started on IV antibiotics as well as intravenous steroids.  She required BiPAP on admission.  She is slowly improving.   Assessment & Plan:   Principal Problem:   HCAP (healthcare-associated pneumonia) Active Problems:   Asthma exacerbation   Anemia   Hypertension   GERD (gastroesophageal reflux disease)   Tobacco use   Abnormal EKG   Tachycardia   1. Acute respiratory failure with hypoxia.  Related to pneumonia and asthma exacerbation.  Required BiPAP on admission.  Currently on nasal cannula.  Continue to wean off oxygen as tolerated. 2. Healthcare associated pneumonia.  Started on vancomycin and cefepime on admission.  MRSA PCR is negative so vancomycin has been discontinued.  Patient had 1 out of 2 positive blood cultures for gram-positive cocci in clusters.  Cefepime has been changed to Ancef.  Follow-up further sensitivities.  Echocardiogram did not show any signs of vegetations.  Blood cultures been repeated. 3. Asthma exacerbation.  Continue on IV steroids as well as bronchodilators.  We will add inhaled steroids.  Continues to have wheezing. 4. Sinus tachycardia.  Likely related to volume depletion/reactive to pneumonia and hypoxia.  Continue to follow. 5. GERD.  Continue on PPI 6. Tobacco use.  Nicotine replacement therapy has been offered. 7. Low back pain.  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  Treat supportively with pain management.  Evaluated by physical therapy who recommended home health.  In  light of positive blood culture, will check MRI of the lumbar spine to rule out discitis.   DVT prophylaxis: Lovenox Code Status: Full code Family Communication: No family present Disposition Plan: Discharge home once improved   Consultants:     Procedures:  Echocardiogram: - Left ventricle: The cavity size was normal. Wall thickness was   normal. Systolic function was normal. The estimated ejection   fraction was in the range of 60% to 65%. Wall motion was normal;   there were no regional wall motion abnormalities. Left   ventricular diastolic function parameters were normal. - Mitral valve: Mildly calcified annulus. There was trivial   regurgitation. - Right atrium: Central venous pressure (est): 3 mm Hg. - Atrial septum: There was increased thickness of the septum,   consistent with lipomatous hypertrophy. No defect or patent   foramen ovale was identified. - Tricuspid valve: There was trivial regurgitation. - Pulmonary arteries: Systolic pressure could not be accurately   estimated. - Pericardium, extracardiac: A prominent pericardial fat pad was    present.  Antimicrobials:   Cefepime 10/7 > 10/10  Ancef 10/10>   Subjective: Continues to have low back pain.  Has difficulty moving around from it.  Feels her breathing is slowly improving, although she is still wheezing.  Has productive cough.  Objective: Vitals:   01/09/18 0818 01/09/18 0946 01/09/18 1326 01/09/18 1344  BP:  (!) 131/93 (!) 160/93   Pulse:  (!) 101 100   Resp:  20 18   Temp: (!) 97.5 F (36.4 C) (!) 97.5 F (36.4 C) 97.8 F (36.6 C)  TempSrc: Oral Oral    SpO2:  97% 96% 95%  Weight:      Height:        Intake/Output Summary (Last 24 hours) at 01/09/2018 1748 Last data filed at 01/09/2018 1551 Gross per 24 hour  Intake 2653.43 ml  Output 200 ml  Net 2453.43 ml   Filed Weights   01/07/18 0500 01/08/18 0400 01/09/18 0500  Weight: 63.8 kg 68.1 kg 70.7 kg    Examination:  General  exam: Alert, awake, oriented x 3 Respiratory system: Bilateral wheezing. Respiratory effort normal. Cardiovascular system:RRR. No murmurs, rubs, gallops. Gastrointestinal system: Abdomen is nondistended, soft and nontender. No organomegaly or masses felt. Normal bowel sounds heard. Central nervous system: Alert and oriented. No focal neurological deficits. Extremities: No C/C/E, +pedal pulses Skin: No rashes, lesions or ulcers Psychiatry: Judgement and insight appear normal. Mood & affect appropriate.   Data Reviewed: I have personally reviewed following labs and imaging studies  CBC: Recent Labs  Lab 01/03/18 1401  01/05/18 0523 01/06/18 1828 01/07/18 0425 01/08/18 0442 01/09/18 0400  WBC 2.8*   < > 3.7* 6.0 3.8* 5.8 6.2  NEUTROABS 2.1  --   --  5.4 3.3 4.9 5.2  HGB 11.2*   < > 10.6* 10.9* 9.9* 9.8* 9.2*  HCT 33.1*   < > 32.0* 33.2* 30.3* 30.8* 29.8*  MCV 90.2   < > 91.7 91.7 91.8 94.8 94.0  PLT 181   < > 186 157 121* 123* 129*   < > = values in this interval not displayed.   Basic Metabolic Panel: Recent Labs  Lab 01/03/18 1401  01/05/18 0523  01/06/18 1828 01/06/18 2025 01/07/18 0425 01/08/18 0442 01/09/18 0400  NA 136   < > 141  --  137 137 140 138 140  K 3.0*   < > 2.6*   < > 4.2 4.2 4.0 3.6 4.1  CL 105   < > 112*  --  106 105 108 107 111  CO2 23   < > 20*  --  22 22 25 24 23   GLUCOSE 116*   < > 138*  --  82 97 101* 225* 216*  BUN 30*   < > 14  --  11 11 14  23* 22*  CREATININE 1.27*   < > 0.70  --  0.75 0.75 0.61 0.71 0.67  CALCIUM 8.5*   < > 8.3*  --  8.2* 8.0* 8.0* 7.7* 7.7*  MG 1.9  --  2.0  --  1.4*  --   --   --   --   PHOS  --   --   --   --  2.5  --   --   --   --    < > = values in this interval not displayed.   GFR: Estimated Creatinine Clearance: 68.8 mL/min (by C-G formula based on SCr of 0.67 mg/dL). Liver Function Tests: Recent Labs  Lab 01/03/18 1401 01/06/18 1828 01/06/18 2025  AST 27 19 22   ALT 20 26 22   ALKPHOS 62 66 59  BILITOT 0.8  0.7 0.7  PROT 6.8 5.6* 5.0*  ALBUMIN 3.2* 2.2* 2.0*   No results for input(s): LIPASE, AMYLASE in the last 168 hours. No results for input(s): AMMONIA in the last 168 hours. Coagulation Profile: No results for input(s): INR, PROTIME in the last 168 hours. Cardiac Enzymes: No results for input(s): CKTOTAL, CKMB, CKMBINDEX, TROPONINI in the last 168 hours. BNP (last 3 results) No results for  input(s): PROBNP in the last 8760 hours. HbA1C: No results for input(s): HGBA1C in the last 72 hours. CBG: No results for input(s): GLUCAP in the last 168 hours. Lipid Profile: No results for input(s): CHOL, HDL, LDLCALC, TRIG, CHOLHDL, LDLDIRECT in the last 72 hours. Thyroid Function Tests: No results for input(s): TSH, T4TOTAL, FREET4, T3FREE, THYROIDAB in the last 72 hours. Anemia Panel: Recent Labs    01/07/18 0425  VITAMINB12 221  FOLATE 3.9*  FERRITIN 467*  TIBC 162*  IRON 23*  RETICCTPCT 1.1   Sepsis Labs: Recent Labs  Lab 01/03/18 1401 01/06/18 1829 01/06/18 2349 01/07/18 0425  LATICACIDVEN 1.8 3.49* 2.8* 1.8    Recent Results (from the past 240 hour(s))  Blood Culture (routine x 2)     Status: None (Preliminary result)   Collection Time: 01/06/18  6:28 PM  Result Value Ref Range Status   Specimen Description BLOOD RIGHT HAND  Final   Special Requests   Final    BOTTLES DRAWN AEROBIC AND ANAEROBIC Blood Culture adequate volume   Culture   Final    NO GROWTH 3 DAYS Performed at [facility], [address], [place], [place] [address]    Report Status PENDING  Incomplete  Blood Culture (routine x 2)     Status: None (Preliminary result)   Collection Time: 01/06/18  6:28 PM  Result Value Ref Range Status   Specimen Description   Final    BLOOD LEFT HAND Performed at [facility], [address] [place], [place] [address]    Special Requests   Final    BOTTLES DRAWN AEROBIC AND ANAEROBIC Blood Culture adequate volume Performed at [facility], [address]  [place], [place] [address]    Culture  Setup Time   Final    GRAM POSITIVE COCCI IN CLUSTERS Performed at [facility] Gram Stain Report Called to,Read Back By and Verified With: SMITH,J AT 2114 BY HUFFINES,S ON 01/08/18. AEROBIC BOTTLE ONLY CRITICAL RESULT CALLED TO, READ BACK BY AND VERIFIED WITH: Pasty Arch RN [id number] 01/09/18 A BROWNING Performed at [facility], [address], [place], [place] [address]    Culture GRAM POSITIVE COCCI  Final   Report Status PENDING  Incomplete  Blood Culture ID Panel (Reflexed)     Status: Abnormal   Collection Time: 01/06/18  6:28 PM  Result Value Ref Range Status   Enterococcus species NOT DETECTED NOT DETECTED Final   Listeria monocytogenes NOT DETECTED NOT DETECTED Final   Staphylococcus species DETECTED (A) NOT DETECTED Final    Comment: CRITICAL RESULT CALLED TO, READ BACK BY AND VERIFIED WITH: Pasty Arch RN [id number] 01/09/18 A BROWNING    Staphylococcus aureus (BCID) DETECTED (A) NOT DETECTED Final    Comment: Methicillin (oxacillin) susceptible Staphylococcus aureus (MSSA). Preferred therapy is anti staphylococcal beta lactam antibiotic (Cefazolin or Nafcillin), unless clinically contraindicated. CRITICAL RESULT CALLED TO, READ BACK BY AND VERIFIED WITH: Pasty Arch RN [id number] 01/09/18 A BROWNING    Methicillin resistance NOT DETECTED NOT DETECTED Final   Streptococcus species NOT DETECTED NOT DETECTED Final   Streptococcus agalactiae NOT DETECTED NOT DETECTED Final   Streptococcus pneumoniae NOT DETECTED NOT DETECTED Final   Streptococcus pyogenes NOT DETECTED NOT DETECTED Final   Acinetobacter baumannii NOT DETECTED NOT DETECTED Final   Enterobacteriaceae species NOT DETECTED NOT DETECTED Final   Enterobacter cloacae complex NOT DETECTED NOT DETECTED Final   Escherichia coli NOT DETECTED NOT DETECTED Final   Klebsiella oxytoca NOT DETECTED NOT DETECTED  Final   Klebsiella pneumoniae NOT DETECTED NOT DETECTED Final   Proteus species NOT DETECTED  NOT DETECTED Final   Serratia marcescens NOT DETECTED NOT DETECTED Final   Haemophilus influenzae NOT DETECTED NOT DETECTED Final   Neisseria meningitidis NOT DETECTED NOT DETECTED Final   Pseudomonas aeruginosa NOT DETECTED NOT DETECTED Final   Candida albicans NOT DETECTED NOT DETECTED Final   Candida glabrata NOT DETECTED NOT DETECTED Final   Candida krusei NOT DETECTED NOT DETECTED Final   Candida parapsilosis NOT DETECTED NOT DETECTED Final   Candida tropicalis NOT DETECTED NOT DETECTED Final    Comment: Performed at [facility], [address] [place], [place] [address]  MRSA PCR Screening     Status: None   Collection Time: 01/06/18  9:11 PM  Result Value Ref Range Status   MRSA by PCR NEGATIVE NEGATIVE Final    Comment:        The GeneXpert MRSA Assay (FDA approved for NASAL specimens only), is one component of a comprehensive MRSA colonization surveillance program. It is not intended to diagnose MRSA infection nor to guide or monitor treatment for MRSA infections. Performed at [facility], [address] [place], [place] [address]   Culture, blood (routine x 2)     Status: None (Preliminary result)   Collection Time: 01/09/18  1:54 PM  Result Value Ref Range Status   Specimen Description BLOOD LEFT ANTECUBITAL  Final   Special Requests   Final    BOTTLES DRAWN AEROBIC AND ANAEROBIC Blood Culture adequate volume Performed at [facility] [address] [place], [place] [address]    Culture PENDING  Incomplete   Report Status PENDING  Incomplete  Culture, blood (routine x 2)     Status: None (Preliminary result)   Collection Time: 01/09/18  2:01 PM  Result Value Ref Range Status   Specimen Description BLOOD BLOOD LEFT HAND  Final   Special Requests   Final    BOTTLES DRAWN AEROBIC ONLY Blood Culture adequate volume Performed at [facility], [address], [place], [place] [address]    Culture PENDING  Incomplete   Report Status PENDING  Incomplete          Radiology Studies: No results found.      Scheduled Meds: . budesonide (PULMICORT) nebulizer solution  0.25 mg Nebulization BID  . enoxaparin (LOVENOX) injection  40 mg Subcutaneous Q24H  . guaiFENesin  1,200 mg Oral BID  . ipratropium  0.5 mg Nebulization Q6H  . levalbuterol  1.25 mg Nebulization Q6H  . mouth rinse  15 mL Mouth Rinse BID  . methylPREDNISolone (SOLU-MEDROL) injection  60 mg Intravenous Q6H  . pantoprazole  40 mg Oral Daily   Continuous Infusions: . sodium chloride Stopped (01/09/18 1550)  .  ceFAZolin (ANCEF) IV 200 mL/hr at 01/09/18 1551     LOS: 3 days    Time spent:    Erick Blinks, MD [facility] Pager [phone]  If 7PM-7AM, please contact night-coverage [email or web address] Password [email or web address] 01/09/2018, 5:48 PM

## 2018-01-09 NOTE — Progress Notes (Signed)
CRITICAL VALUE ALERT  Critical Value:  +blood culture, gram +cocci, BICD detected staph aureus Date & Time Notied:  01/09/18 0240  Provider Notified: Robb Matar Orders Received/Actions taken: new orders for vanc

## 2018-01-10 LAB — BASIC METABOLIC PANEL
ANION GAP: 7 (ref 5–15)
BUN: 14 mg/dL (ref 6–20)
CALCIUM: 7.2 mg/dL — AB (ref 8.9–10.3)
CO2: 23 mmol/L (ref 22–32)
CREATININE: 0.6 mg/dL (ref 0.44–1.00)
Chloride: 107 mmol/L (ref 98–111)
Glucose, Bld: 233 mg/dL — ABNORMAL HIGH (ref 70–99)
Potassium: 3.9 mmol/L (ref 3.5–5.1)
SODIUM: 137 mmol/L (ref 135–145)

## 2018-01-10 LAB — CBC WITH DIFFERENTIAL/PLATELET
Abs Immature Granulocytes: 0.38 10*3/uL — ABNORMAL HIGH (ref 0.00–0.07)
BASOS ABS: 0 10*3/uL (ref 0.0–0.1)
BASOS PCT: 1 %
EOS ABS: 0 10*3/uL (ref 0.0–0.5)
Eosinophils Relative: 0 %
HCT: 31.4 % — ABNORMAL LOW (ref 36.0–46.0)
Hemoglobin: 9.5 g/dL — ABNORMAL LOW (ref 12.0–15.0)
IMMATURE GRANULOCYTES: 6 %
LYMPHS ABS: 0.5 10*3/uL — AB (ref 0.7–4.0)
Lymphocytes Relative: 8 %
MCH: 28.6 pg (ref 26.0–34.0)
MCHC: 30.3 g/dL (ref 30.0–36.0)
MCV: 94.6 fL (ref 80.0–100.0)
Monocytes Absolute: 0.1 10*3/uL (ref 0.1–1.0)
Monocytes Relative: 2 %
NEUTROS PCT: 83 %
NRBC: 0 % (ref 0.0–0.2)
Neutro Abs: 5 10*3/uL (ref 1.7–7.7)
PLATELETS: 126 10*3/uL — AB (ref 150–400)
RBC: 3.32 MIL/uL — AB (ref 3.87–5.11)
RDW: 14.5 % (ref 11.5–15.5)
WBC: 6 10*3/uL (ref 4.0–10.5)

## 2018-01-10 LAB — GLUCOSE, CAPILLARY
GLUCOSE-CAPILLARY: 235 mg/dL — AB (ref 70–99)
GLUCOSE-CAPILLARY: 204 mg/dL — AB (ref 70–99)

## 2018-01-10 MED ORDER — CYCLOBENZAPRINE HCL 10 MG PO TABS
5.0000 mg | ORAL_TABLET | Freq: Three times a day (TID) | ORAL | Status: DC
  Administered 2018-01-10 – 2018-01-13 (×8): 5 mg via ORAL
  Filled 2018-01-10 (×9): qty 1

## 2018-01-10 MED ORDER — INSULIN ASPART 100 UNIT/ML ~~LOC~~ SOLN
0.0000 [IU] | Freq: Three times a day (TID) | SUBCUTANEOUS | Status: DC
  Administered 2018-01-10: 3 [IU] via SUBCUTANEOUS
  Administered 2018-01-11: 1 [IU] via SUBCUTANEOUS
  Administered 2018-01-11: 2 [IU] via SUBCUTANEOUS
  Administered 2018-01-11: 3 [IU] via SUBCUTANEOUS
  Administered 2018-01-12 (×2): 2 [IU] via SUBCUTANEOUS
  Administered 2018-01-12: 3 [IU] via SUBCUTANEOUS
  Administered 2018-01-13 – 2018-01-14 (×3): 2 [IU] via SUBCUTANEOUS

## 2018-01-10 MED ORDER — METOPROLOL TARTRATE 25 MG PO TABS
25.0000 mg | ORAL_TABLET | Freq: Two times a day (BID) | ORAL | Status: DC
  Administered 2018-01-10 – 2018-01-14 (×8): 25 mg via ORAL
  Filled 2018-01-10 (×8): qty 1

## 2018-01-10 NOTE — Progress Notes (Signed)
Patient still refusing BIPAP at night. Unit still at bedside.

## 2018-01-10 NOTE — Progress Notes (Signed)
Inpatient Diabetes Program Recommendations  AACE/ADA: New Consensus Statement on Inpatient Glycemic Control (2015)  Target Ranges:  Prepandial:   less than 140 mg/dL      Peak postprandial:   less than 180 mg/dL (1-2 hours)      Critically ill patients:  140 - 180 mg/dL   Lab Results  Component Value Date   GLUCAP 160 (H) 11/28/2011   HGBA1C  04/02/2009    5.5 (NOTE) The ADA recommends the following therapeutic goal for glycemic control related to Hgb A1c measurement: Goal of therapy: <6.5 Hgb A1c  Reference: American Diabetes Association: Clinical Practice Recommendations 2010, Diabetes Care, 2010, 33: (Suppl  1).    Review of Glycemic Control Results for PRISCA, GEARING (MRN [id number]) as of 01/10/2018 12:42  Ref. Range 01/10/2018 06:08  Glucose Latest Ref Range: 70 - 99 mg/dL 409 (H)   Diabetes history: none Outpatient Diabetes medications: none Current orders for Inpatient glycemic control: none Solumedrol 60 mg Q6H   Inpatient Diabetes Program Recommendations:    In the setting of steroids, anticipate BS to trend higher. Consider adding Novolog 0-9 units TID and Novolog 0-5 QHS under glycemic control order set.   Last A1C from 2011, could consider repeating?  Thanks, Lujean Rave, MSN, RNC-OB Diabetes Coordinator [phone] (8a-5p)

## 2018-01-10 NOTE — Progress Notes (Signed)
PROGRESS NOTE    Melanie [name]  ZHY:[id number] DOB: 1965/07/10 DOA: 01/06/2018 PCP: Tanna Furry, MD    Brief Narrative:  52 year old female with a history of asthma, prior subdural hematoma, short-term memory loss, who was discharged from the hospital on 10/6 and unfortunately had to return the following day with worsening shortness of breath and fever.  Found to have worsening pneumonia.  Was also felt that she had an asthma exacerbation.  She was started on IV antibiotics as well as intravenous steroids.  She required BiPAP on admission.  She is slowly improving.   Assessment & Plan:   Principal Problem:   HCAP (healthcare-associated pneumonia) Active Problems:   Asthma exacerbation   Anemia   Hypertension   GERD (gastroesophageal reflux disease)   Tobacco use   Abnormal EKG   Tachycardia   Bacteremia due to Staphylococcus aureus   1. Acute respiratory failure with hypoxia.  Related to pneumonia and asthma exacerbation.  Required BiPAP on admission.  Currently on nasal cannula.  Continue to wean off oxygen as tolerated. 2. Healthcare associated pneumonia.  Started on vancomycin and cefepime on admission.  MRSA PCR is negative so vancomycin has been discontinued.  Patient had 1 out of 2 positive blood cultures for staph aureus.  Further sensitivities are pending.  Cefepime was changed to Ancef.  Follow-up further sensitivities. 2D Echocardiogram did not show any signs of vegetations.  Blood cultures been repeated, but have not shown any growth.  He will need TEE to definitively define antibiotic course. 3. Asthma exacerbation.  Still has some residual wheezing and rhonchi.  Continue on steroids and bronchodilators. 4. Sinus tachycardia.  Likely related to volume depletion/reactive to pneumonia and hypoxia.  Continue to follow. 5. GERD.  Continue on PPI 6. Tobacco use.  Nicotine replacement therapy has been offered. 7. Low back pain.  MRI lumbar spine did not show any  significant disease.  May have muscle strain.  Treat supportively.  Will add Flexeril   DVT prophylaxis: Lovenox Code Status: Full code Family Communication: No family present Disposition Plan: Discharge home once improved, patient is refusing any sort of placement   Consultants:     Procedures:  Echocardiogram: - Left ventricle: The cavity size was normal. Wall thickness was   normal. Systolic function was normal. The estimated ejection   fraction was in the range of 60% to 65%. Wall motion was normal;   there were no regional wall motion abnormalities. Left   ventricular diastolic function parameters were normal. - Mitral valve: Mildly calcified annulus. There was trivial   regurgitation. - Right atrium: Central venous pressure (est): 3 mm Hg. - Atrial septum: There was increased thickness of the septum,   consistent with lipomatous hypertrophy. No defect or patent   foramen ovale was identified. - Tricuspid valve: There was trivial regurgitation. - Pulmonary arteries: Systolic pressure could not be accurately   estimated. - Pericardium, extracardiac: A prominent pericardial fat pad was    present.  Antimicrobials:   Cefepime 10/7 > 10/10  Ancef 10/10>   Subjective: Reports continued low back pain that is nonradiating and has been present over several weeks.  Feels that breathing is slowly improving.  Objective: Vitals:   01/10/18 0827 01/10/18 0828 01/10/18 1451 01/10/18 1504  BP:    (!) 161/99  Pulse:    94  Resp:    18  Temp:    98.1 F (36.7 C)  TempSrc:    Oral  SpO2: 96% 96% 98% 96%  Weight:      Height:        Intake/Output Summary (Last 24 hours) at 01/10/2018 1701 Last data filed at 01/10/2018 1626 Gross per 24 hour  Intake 2737.62 ml  Output 800 ml  Net 1937.62 ml   Filed Weights   01/07/18 0500 01/08/18 0400 01/09/18 0500  Weight: 63.8 kg 68.1 kg 70.7 kg    Examination:  General exam: Alert, awake, oriented x 3 Respiratory system:  Mild wheeze bilaterally with scattered rhonchi. Respiratory effort normal. Cardiovascular system:RRR. No murmurs, rubs, gallops. Gastrointestinal system: Abdomen is nondistended, soft and nontender. No organomegaly or masses felt. Normal bowel sounds heard. Central nervous system: Alert and oriented. No focal neurological deficits. Extremities: No C/C/E, +pedal pulses Skin: No rashes, lesions or ulcers Psychiatry: Judgement and insight appear normal. Mood & affect appropriate.     Data Reviewed: I have personally reviewed following labs and imaging studies  CBC: Recent Labs  Lab 01/06/18 1828 01/07/18 0425 01/08/18 0442 01/09/18 0400 01/10/18 0608  WBC 6.0 3.8* 5.8 6.2 6.0  NEUTROABS 5.4 3.3 4.9 5.2 5.0  HGB 10.9* 9.9* 9.8* 9.2* 9.5*  HCT 33.2* 30.3* 30.8* 29.8* 31.4*  MCV 91.7 91.8 94.8 94.0 94.6  PLT 157 121* 123* 129* 126*   Basic Metabolic Panel: Recent Labs  Lab 01/05/18 0523  01/06/18 1828 01/06/18 2025 01/07/18 0425 01/08/18 0442 01/09/18 0400 01/10/18 0608  NA 141  --  137 137 140 138 140 137  K 2.6*   < > 4.2 4.2 4.0 3.6 4.1 3.9  CL 112*  --  106 105 108 107 111 107  CO2 20*  --  22 22 25 24 23 23   GLUCOSE 138*  --  82 97 101* 225* 216* 233*  BUN 14  --  11 11 14  23* 22* 14  CREATININE 0.70  --  0.75 0.75 0.61 0.71 0.67 0.60  CALCIUM 8.3*  --  8.2* 8.0* 8.0* 7.7* 7.7* 7.2*  MG 2.0  --  1.4*  --   --   --   --   --   PHOS  --   --  2.5  --   --   --   --   --    < > = values in this interval not displayed.   GFR: Estimated Creatinine Clearance: 68.8 mL/min (by C-G formula based on SCr of 0.6 mg/dL). Liver Function Tests: Recent Labs  Lab 01/06/18 1828 01/06/18 2025  AST 19 22  ALT 26 22  ALKPHOS 66 59  BILITOT 0.7 0.7  PROT 5.6* 5.0*  ALBUMIN 2.2* 2.0*   No results for input(s): LIPASE, AMYLASE in the last 168 hours. No results for input(s): AMMONIA in the last 168 hours. Coagulation Profile: No results for input(s): INR, PROTIME in the last  168 hours. Cardiac Enzymes: No results for input(s): CKTOTAL, CKMB, CKMBINDEX, TROPONINI in the last 168 hours. BNP (last 3 results) No results for input(s): PROBNP in the last 8760 hours. HbA1C: No results for input(s): HGBA1C in the last 72 hours. CBG: No results for input(s): GLUCAP in the last 168 hours. Lipid Profile: No results for input(s): CHOL, HDL, LDLCALC, TRIG, CHOLHDL, LDLDIRECT in the last 72 hours. Thyroid Function Tests: No results for input(s): TSH, T4TOTAL, FREET4, T3FREE, THYROIDAB in the last 72 hours. Anemia Panel: No results for input(s): VITAMINB12, FOLATE, FERRITIN, TIBC, IRON, RETICCTPCT in the last 72 hours. Sepsis Labs: Recent Labs  Lab 01/06/18 1829 01/06/18 2349 01/07/18 0425  LATICACIDVEN 3.49* 2.8*  1.8    Recent Results (from the past 240 hour(s))  Blood Culture (routine x 2)     Status: None (Preliminary result)   Collection Time: 01/06/18  6:28 PM  Result Value Ref Range Status   Specimen Description BLOOD RIGHT HAND  Final   Special Requests   Final    BOTTLES DRAWN AEROBIC AND ANAEROBIC Blood Culture adequate volume   Culture   Final    NO GROWTH 4 DAYS Performed at [facility], [address] [place], [place] [address]    Report Status PENDING  Incomplete  Blood Culture (routine x 2)     Status: Abnormal (Preliminary result)   Collection Time: 01/06/18  6:28 PM  Result Value Ref Range Status   Specimen Description   Final    BLOOD LEFT HAND Performed at [facility], [address] [place], [place] [address]    Special Requests   Final    BOTTLES DRAWN AEROBIC AND ANAEROBIC Blood Culture adequate volume Performed at [facility], [address] [place], [place] [address]    Culture  Setup Time   Final    GRAM POSITIVE COCCI IN CLUSTERS Performed at [facility] Gram Stain Report Called to,Read Back By and Verified With: SMITH,J AT 2114 BY HUFFINES,S ON 01/08/18. AEROBIC BOTTLE ONLY CRITICAL RESULT CALLED TO, READ BACK BY  AND VERIFIED WITH: Pasty Arch RN [id number] 01/09/18 A BROWNING    Culture (A)  Final    STAPHYLOCOCCUS AUREUS SUSCEPTIBILITIES TO FOLLOW Performed at [facility], [address] [place], [place] [address]    Report Status PENDING  Incomplete  Blood Culture ID Panel (Reflexed)     Status: Abnormal   Collection Time: 01/06/18  6:28 PM  Result Value Ref Range Status   Enterococcus species NOT DETECTED NOT DETECTED Final   Listeria monocytogenes NOT DETECTED NOT DETECTED Final   Staphylococcus species DETECTED (A) NOT DETECTED Final    Comment: CRITICAL RESULT CALLED TO, READ BACK BY AND VERIFIED WITH: Pasty Arch RN [id number] 01/09/18 A BROWNING    Staphylococcus aureus (BCID) DETECTED (A) NOT DETECTED Final    Comment: Methicillin (oxacillin) susceptible Staphylococcus aureus (MSSA). Preferred therapy is anti staphylococcal beta lactam antibiotic (Cefazolin or Nafcillin), unless clinically contraindicated. CRITICAL RESULT CALLED TO, READ BACK BY AND VERIFIED WITH: Pasty Arch RN [id number] 01/09/18 A BROWNING    Methicillin resistance NOT DETECTED NOT DETECTED Final   Streptococcus species NOT DETECTED NOT DETECTED Final   Streptococcus agalactiae NOT DETECTED NOT DETECTED Final   Streptococcus pneumoniae NOT DETECTED NOT DETECTED Final   Streptococcus pyogenes NOT DETECTED NOT DETECTED Final   Acinetobacter baumannii NOT DETECTED NOT DETECTED Final   Enterobacteriaceae species NOT DETECTED NOT DETECTED Final   Enterobacter cloacae complex NOT DETECTED NOT DETECTED Final   Escherichia coli NOT DETECTED NOT DETECTED Final   Klebsiella oxytoca NOT DETECTED NOT DETECTED Final   Klebsiella pneumoniae NOT DETECTED NOT DETECTED Final   Proteus species NOT DETECTED NOT DETECTED Final   Serratia marcescens NOT DETECTED NOT DETECTED Final   Haemophilus influenzae NOT DETECTED NOT DETECTED Final   Neisseria meningitidis NOT DETECTED NOT DETECTED Final   Pseudomonas aeruginosa NOT DETECTED NOT DETECTED Final    Candida albicans NOT DETECTED NOT DETECTED Final   Candida glabrata NOT DETECTED NOT DETECTED Final   Candida krusei NOT DETECTED NOT DETECTED Final   Candida parapsilosis NOT DETECTED NOT DETECTED Final   Candida tropicalis NOT DETECTED NOT DETECTED Final  Comment: Performed at [facility], [address], [place], [place] [address]  MRSA PCR Screening     Status: None   Collection Time: 01/06/18  9:11 PM  Result Value Ref Range Status   MRSA by PCR NEGATIVE NEGATIVE Final    Comment:        The GeneXpert MRSA Assay (FDA approved for NASAL specimens only), is one component of a comprehensive MRSA colonization surveillance program. It is not intended to diagnose MRSA infection nor to guide or monitor treatment for MRSA infections. Performed at [facility], [address], [place], [place] [address]   Culture, blood (routine x 2)     Status: None (Preliminary result)   Collection Time: 01/09/18  1:54 PM  Result Value Ref Range Status   Specimen Description BLOOD LEFT ANTECUBITAL  Final   Special Requests   Final    BOTTLES DRAWN AEROBIC AND ANAEROBIC Blood Culture adequate volume   Culture   Final    NO GROWTH < 24 HOURS Performed at [facility], [address], [place], [place] [address]    Report Status PENDING  Incomplete  Culture, blood (routine x 2)     Status: None (Preliminary result)   Collection Time: 01/09/18  2:01 PM  Result Value Ref Range Status   Specimen Description BLOOD BLOOD LEFT HAND  Final   Special Requests   Final    BOTTLES DRAWN AEROBIC ONLY Blood Culture adequate volume   Culture   Final    NO GROWTH < 24 HOURS Performed at [facility], [address], [place], [place] [address]    Report Status PENDING  Incomplete         Radiology Studies: Mr Lumbar Spine Wo Contrast  Result Date: 01/09/2018 CLINICAL DATA:  Recent fall, low back pain for 2 months EXAM: MRI LUMBAR SPINE WITHOUT CONTRAST TECHNIQUE: Multiplanar, multisequence MR  imaging of the lumbar spine was performed. No intravenous contrast was administered. COMPARISON:  None. FINDINGS: Segmentation:  Standard. Alignment:  Physiologic. Vertebrae:  No fracture, evidence of discitis, or bone lesion. Conus medullaris and cauda equina: Conus extends to the T12-L1 level. Conus and cauda equina appear normal. Paraspinal and other soft tissues: No acute paraspinal abnormality. Soft tissue edema in the subcutaneous fat along the posterior aspect of the back. Disc levels: Disc spaces: Disc spaces are maintained. T12-L1: No significant disc bulge. No evidence of neural foraminal stenosis. No central canal stenosis. L1-L2: No significant disc bulge. No evidence of neural foraminal stenosis. No central canal stenosis. L2-L3: No significant disc bulge. No evidence of neural foraminal stenosis. No central canal stenosis. L3-L4: No significant disc bulge. No evidence of neural foraminal stenosis. No central canal stenosis. L4-L5: No significant disc bulge. No evidence of neural foraminal stenosis. No central canal stenosis. L5-S1: No significant disc bulge. No evidence of neural foraminal stenosis. No central canal stenosis. IMPRESSION: 1.  No acute osseous injury of the lumbar spine. 2. No discitis or osteomyelitis of the lumbar spine. Electronically Signed   By: Elige Ko   On: 01/09/2018 20:31        Scheduled Meds: . budesonide (PULMICORT) nebulizer solution  0.25 mg Nebulization BID  . cyclobenzaprine  5 mg Oral TID  . enoxaparin (LOVENOX) injection  40 mg Subcutaneous Q24H  . guaiFENesin  1,200 mg Oral BID  . insulin aspart  0-9 Units Subcutaneous TID WC  . ipratropium  0.5 mg Nebulization Q6H  . levalbuterol  1.25 mg Nebulization Q6H  .  mouth rinse  15 mL Mouth Rinse BID  . methylPREDNISolone (SOLU-MEDROL) injection  60 mg Intravenous Q6H  . nicotine  14 mg Transdermal Daily  . pantoprazole  40 mg Oral Daily   Continuous Infusions: . sodium chloride 100 mL/hr at 01/10/18  0214  .  ceFAZolin (ANCEF) IV 2 g (01/10/18 1639)     LOS: 4 days    Time spent:    Erick Blinks, MD [facility] Pager [phone]  If 7PM-7AM, please contact night-coverage [email or web address] Password [email or web address] 01/10/2018, 5:01 PM

## 2018-01-11 LAB — GLUCOSE, CAPILLARY
GLUCOSE-CAPILLARY: 140 mg/dL — AB (ref 70–99)
GLUCOSE-CAPILLARY: 203 mg/dL — AB (ref 70–99)
Glucose-Capillary: 167 mg/dL — ABNORMAL HIGH (ref 70–99)

## 2018-01-11 LAB — CBC WITH DIFFERENTIAL/PLATELET
ABS IMMATURE GRANULOCYTES: 0.44 10*3/uL — AB (ref 0.00–0.07)
Basophils Absolute: 0 10*3/uL (ref 0.0–0.1)
Basophils Relative: 0 %
Eosinophils Absolute: 0 10*3/uL (ref 0.0–0.5)
Eosinophils Relative: 0 %
HCT: 31.4 % — ABNORMAL LOW (ref 36.0–46.0)
HEMOGLOBIN: 10.2 g/dL — AB (ref 12.0–15.0)
IMMATURE GRANULOCYTES: 6 %
LYMPHS PCT: 9 %
Lymphs Abs: 0.6 10*3/uL — ABNORMAL LOW (ref 0.7–4.0)
MCH: 30 pg (ref 26.0–34.0)
MCHC: 32.5 g/dL (ref 30.0–36.0)
MCV: 92.4 fL (ref 80.0–100.0)
MONO ABS: 0.1 10*3/uL (ref 0.1–1.0)
MONOS PCT: 2 %
NEUTROS ABS: 5.7 10*3/uL (ref 1.7–7.7)
NEUTROS PCT: 83 %
Platelets: 122 10*3/uL — ABNORMAL LOW (ref 150–400)
RBC: 3.4 MIL/uL — AB (ref 3.87–5.11)
RDW: 13.9 % (ref 11.5–15.5)
WBC: 6.8 10*3/uL (ref 4.0–10.5)
nRBC: 0.3 % — ABNORMAL HIGH (ref 0.0–0.2)

## 2018-01-11 LAB — CULTURE, BLOOD (ROUTINE X 2): SPECIAL REQUESTS: ADEQUATE

## 2018-01-11 LAB — HEMOGLOBIN A1C
Hgb A1c MFr Bld: 6.1 % — ABNORMAL HIGH (ref 4.8–5.6)
Mean Plasma Glucose: 128.37 mg/dL

## 2018-01-11 MED ORDER — DIPHENHYDRAMINE HCL 25 MG PO CAPS
25.0000 mg | ORAL_CAPSULE | Freq: Four times a day (QID) | ORAL | Status: DC | PRN
  Administered 2018-01-11: 25 mg via ORAL
  Filled 2018-01-11: qty 1

## 2018-01-11 MED ORDER — METHYLPREDNISOLONE SODIUM SUCC 125 MG IJ SOLR
60.0000 mg | Freq: Two times a day (BID) | INTRAMUSCULAR | Status: DC
  Administered 2018-01-12: 60 mg via INTRAVENOUS
  Filled 2018-01-11: qty 2

## 2018-01-11 NOTE — Progress Notes (Addendum)
Patient refused BiPAP , unit removed from bedside

## 2018-01-11 NOTE — Progress Notes (Signed)
PROGRESS NOTE    Melanie [name]  ZOX:[id number] DOB: 1965/09/17 DOA: 01/06/2018 PCP: Tanna Furry, MD    Brief Narrative:  52 year old female with a history of asthma, prior subdural hematoma, short-term memory loss, who was discharged from the hospital on 10/6 and unfortunately had to return the following day with worsening shortness of breath and fever.  Found to have worsening pneumonia.  Was also felt that she had an asthma exacerbation.  She was started on IV antibiotics as well as intravenous steroids.  She required BiPAP on admission.  She is slowly improving.   Assessment & Plan:   Principal Problem:   HCAP (healthcare-associated pneumonia) Active Problems:   Asthma exacerbation   Anemia   Hypertension   GERD (gastroesophageal reflux disease)   Tobacco use   Abnormal EKG   Tachycardia   Bacteremia due to Staphylococcus aureus   1. Acute respiratory failure with hypoxia.  Related to pneumonia and asthma exacerbation.  Required BiPAP on admission, but has since been weaned down to room air. 2. Healthcare associated pneumonia.  Initially started on vancomycin and cefepime on admission.  MRSA PCR was negative so vancomycin was discontinued.  Currently on intravenous Ancef based on blood culture results.  Clinically she is improving. 3. MSSA bacteremia.  Patient had 1 out of 2 positive blood cultures for MSSA bacteremia.  Repeat cultures done on 10/10 did not show any growth.  2D echocardiogram is unrevealing.  She will need TEE on Monday to help determine length of antibiotic course.  If TEE is negative, she could probably be treated with 2 weeks of intravenous Ancef.  She will need PICC line placement. 4. Asthma exacerbation.  Wheezing is better.  Will decrease steroid dosing.  Continue on bronchodilators.. 5. Sinus tachycardia.  Likely related to volume depletion/reactive to pneumonia and hypoxia.  Now resolved 6. GERD.  Continue on PPI 7. Tobacco use.  Nicotine  replacement therapy has been offered. 8. Low back pain.  MRI lumbar spine did not show any significant disease.  May have muscle strain.  Treat supportively with pain medications and Flexeril.  Will need outpatient follow-up   DVT prophylaxis: Lovenox Code Status: Full code Family Communication: No family present Disposition Plan: Discharge home once improved, patient is refusing any sort of placement   Consultants:     Procedures:  Echocardiogram: - Left ventricle: The cavity size was normal. Wall thickness was   normal. Systolic function was normal. The estimated ejection   fraction was in the range of 60% to 65%. Wall motion was normal;   there were no regional wall motion abnormalities. Left   ventricular diastolic function parameters were normal. - Mitral valve: Mildly calcified annulus. There was trivial   regurgitation. - Right atrium: Central venous pressure (est): 3 mm Hg. - Atrial septum: There was increased thickness of the septum,   consistent with lipomatous hypertrophy. No defect or patent   foramen ovale was identified. - Tricuspid valve: There was trivial regurgitation. - Pulmonary arteries: Systolic pressure could not be accurately   estimated. - Pericardium, extracardiac: A prominent pericardial fat pad was    present.  Antimicrobials:   Cefepime 10/7 > 10/10  Ancef 10/10>   Subjective: Feels that breathing is improving.  Continues to have productive cough.  Says that back pain is little better after starting on Flexeril.  Objective: Vitals:   01/11/18 0500 01/11/18 0828 01/11/18 1413 01/11/18 1434  BP: 139/84   (!) 152/74  Pulse: 85   84  Resp: 18   18  Temp: 98 F (36.7 C)     TempSrc: Oral     SpO2: 93% 91% 94% 96%  Weight:      Height:        Intake/Output Summary (Last 24 hours) at 01/11/2018 1656 Last data filed at 01/10/2018 1809 Gross per 24 hour  Intake 460.51 ml  Output -  Net 460.51 ml   Filed Weights   01/07/18 0500  01/08/18 0400 01/09/18 0500  Weight: 63.8 kg 68.1 kg 70.7 kg    Examination:  General exam: Alert, awake, oriented x 3 Respiratory system: Scattered rhonchi, wheezing resolved. Respiratory effort normal. Cardiovascular system:RRR. No murmurs, rubs, gallops. Gastrointestinal system: Abdomen is nondistended, soft and nontender. No organomegaly or masses felt. Normal bowel sounds heard. Central nervous system: Alert and oriented. No focal neurological deficits. Extremities: No C/C/E, +pedal pulses Skin: No rashes, lesions or ulcers Psychiatry: Judgement and insight appear normal. Mood & affect appropriate.      Data Reviewed: I have personally reviewed following labs and imaging studies  CBC: Recent Labs  Lab 01/07/18 0425 01/08/18 0442 01/09/18 0400 01/10/18 0608 01/11/18 0755  WBC 3.8* 5.8 6.2 6.0 6.8  NEUTROABS 3.3 4.9 5.2 5.0 5.7  HGB 9.9* 9.8* 9.2* 9.5* 10.2*  HCT 30.3* 30.8* 29.8* 31.4* 31.4*  MCV 91.8 94.8 94.0 94.6 92.4  PLT 121* 123* 129* 126* 122*   Basic Metabolic Panel: Recent Labs  Lab 01/05/18 0523  01/06/18 1828 01/06/18 2025 01/07/18 0425 01/08/18 0442 01/09/18 0400 01/10/18 0608  NA 141  --  137 137 140 138 140 137  K 2.6*   < > 4.2 4.2 4.0 3.6 4.1 3.9  CL 112*  --  106 105 108 107 111 107  CO2 20*  --  22 22 25 24 23 23   GLUCOSE 138*  --  82 97 101* 225* 216* 233*  BUN 14  --  11 11 14  23* 22* 14  CREATININE 0.70  --  0.75 0.75 0.61 0.71 0.67 0.60  CALCIUM 8.3*  --  8.2* 8.0* 8.0* 7.7* 7.7* 7.2*  MG 2.0  --  1.4*  --   --   --   --   --   PHOS  --   --  2.5  --   --   --   --   --    < > = values in this interval not displayed.   GFR: Estimated Creatinine Clearance: 68.8 mL/min (by C-G formula based on SCr of 0.6 mg/dL). Liver Function Tests: Recent Labs  Lab 01/06/18 1828 01/06/18 2025  AST 19 22  ALT 26 22  ALKPHOS 66 59  BILITOT 0.7 0.7  PROT 5.6* 5.0*  ALBUMIN 2.2* 2.0*   No results for input(s): LIPASE, AMYLASE in the last 168  hours. No results for input(s): AMMONIA in the last 168 hours. Coagulation Profile: No results for input(s): INR, PROTIME in the last 168 hours. Cardiac Enzymes: No results for input(s): CKTOTAL, CKMB, CKMBINDEX, TROPONINI in the last 168 hours. BNP (last 3 results) No results for input(s): PROBNP in the last 8760 hours. HbA1C: Recent Labs    01/11/18 0755  HGBA1C 6.1*   CBG: Recent Labs  Lab 01/10/18 1749 01/10/18 2233 01/11/18 0734 01/11/18 1156  GLUCAP 235* 204* 167* 140*   Lipid Profile: No results for input(s): CHOL, HDL, LDLCALC, TRIG, CHOLHDL, LDLDIRECT in the last 72 hours. Thyroid Function Tests: No results for input(s): TSH, T4TOTAL, FREET4, T3FREE, THYROIDAB in  the last 72 hours. Anemia Panel: No results for input(s): VITAMINB12, FOLATE, FERRITIN, TIBC, IRON, RETICCTPCT in the last 72 hours. Sepsis Labs: Recent Labs  Lab 01/06/18 1829 01/06/18 2349 01/07/18 0425  LATICACIDVEN 3.49* 2.8* 1.8    Recent Results (from the past 240 hour(s))  Blood Culture (routine x 2)     Status: None (Preliminary result)   Collection Time: 01/06/18  6:28 PM  Result Value Ref Range Status   Specimen Description BLOOD RIGHT HAND  Final   Special Requests   Final    BOTTLES DRAWN AEROBIC AND ANAEROBIC Blood Culture adequate volume   Culture   Final    NO GROWTH 4 DAYS Performed at [facility], [address], [place], [place] [address]    Report Status PENDING  Incomplete  Blood Culture (routine x 2)     Status: Abnormal   Collection Time: 01/06/18  6:28 PM  Result Value Ref Range Status   Specimen Description   Final    BLOOD LEFT HAND Performed at [facility], [address], [place], [place] [address]    Special Requests   Final    BOTTLES DRAWN AEROBIC AND ANAEROBIC Blood Culture adequate volume Performed at [facility], [address] [place], [place] [address]    Culture  Setup Time   Final    GRAM POSITIVE COCCI IN CLUSTERS Performed at [facility] Gram Stain Report Called to,Read Back By and Verified With: SMITH,J AT 2114 BY HUFFINES,S ON 01/08/18. AEROBIC BOTTLE ONLY CRITICAL RESULT CALLED TO, READ BACK BY AND VERIFIED WITH: Pasty Arch RN [id number] 01/09/18 A BROWNING Performed at [facility], [address], [place], [place] [address]    Culture STAPHYLOCOCCUS AUREUS (A)  Final   Report Status 01/11/2018 FINAL  Final   Organism ID, Bacteria STAPHYLOCOCCUS AUREUS  Final      Susceptibility   Staphylococcus aureus - MIC*    CIPROFLOXACIN >=8 RESISTANT Resistant     ERYTHROMYCIN >=8 RESISTANT Resistant     GENTAMICIN <=0.5 SENSITIVE Sensitive     OXACILLIN 0.5 SENSITIVE Sensitive     TETRACYCLINE <=1 SENSITIVE Sensitive     VANCOMYCIN 1 SENSITIVE Sensitive     TRIMETH/SULFA <=10 SENSITIVE Sensitive     CLINDAMYCIN <=0.25 SENSITIVE Sensitive     RIFAMPIN <=0.5 SENSITIVE Sensitive     Inducible Clindamycin NEGATIVE Sensitive     * STAPHYLOCOCCUS AUREUS  Blood Culture ID Panel (Reflexed)     Status: Abnormal   Collection Time: 01/06/18  6:28 PM  Result Value Ref Range Status   Enterococcus species NOT DETECTED NOT DETECTED Final   Listeria monocytogenes NOT DETECTED NOT DETECTED Final   Staphylococcus species DETECTED (A) NOT DETECTED Final    Comment: CRITICAL RESULT CALLED TO, READ BACK BY AND VERIFIED WITH: Pasty Arch RN [id number] 01/09/18 A BROWNING    Staphylococcus aureus (BCID) DETECTED (A) NOT DETECTED Final    Comment: Methicillin (oxacillin) susceptible Staphylococcus aureus (MSSA). Preferred therapy is anti staphylococcal beta lactam antibiotic (Cefazolin or Nafcillin), unless clinically contraindicated. CRITICAL RESULT CALLED TO, READ BACK BY AND VERIFIED WITH: Pasty Arch RN [id number] 01/09/18 A BROWNING    Methicillin resistance NOT DETECTED NOT DETECTED Final   Streptococcus species NOT DETECTED NOT DETECTED Final   Streptococcus agalactiae NOT DETECTED NOT DETECTED Final   Streptococcus pneumoniae NOT DETECTED NOT  DETECTED Final   Streptococcus pyogenes NOT DETECTED NOT DETECTED Final   Acinetobacter baumannii NOT DETECTED NOT DETECTED Final   Enterobacteriaceae  species NOT DETECTED NOT DETECTED Final   Enterobacter cloacae complex NOT DETECTED NOT DETECTED Final   Escherichia coli NOT DETECTED NOT DETECTED Final   Klebsiella oxytoca NOT DETECTED NOT DETECTED Final   Klebsiella pneumoniae NOT DETECTED NOT DETECTED Final   Proteus species NOT DETECTED NOT DETECTED Final   Serratia marcescens NOT DETECTED NOT DETECTED Final   Haemophilus influenzae NOT DETECTED NOT DETECTED Final   Neisseria meningitidis NOT DETECTED NOT DETECTED Final   Pseudomonas aeruginosa NOT DETECTED NOT DETECTED Final   Candida albicans NOT DETECTED NOT DETECTED Final   Candida glabrata NOT DETECTED NOT DETECTED Final   Candida krusei NOT DETECTED NOT DETECTED Final   Candida parapsilosis NOT DETECTED NOT DETECTED Final   Candida tropicalis NOT DETECTED NOT DETECTED Final    Comment: Performed at [facility], [address] [place], [place] [address]  MRSA PCR Screening     Status: None   Collection Time: 01/06/18  9:11 PM  Result Value Ref Range Status   MRSA by PCR NEGATIVE NEGATIVE Final    Comment:        The GeneXpert MRSA Assay (FDA approved for NASAL specimens only), is one component of a comprehensive MRSA colonization surveillance program. It is not intended to diagnose MRSA infection nor to guide or monitor treatment for MRSA infections. Performed at [facility], [address] [place], [place] [address]   Culture, blood (routine x 2)     Status: None (Preliminary result)   Collection Time: 01/09/18  1:54 PM  Result Value Ref Range Status   Specimen Description BLOOD LEFT ANTECUBITAL  Final   Special Requests   Final    BOTTLES DRAWN AEROBIC AND ANAEROBIC Blood Culture adequate volume   Culture   Final    NO GROWTH < 24 HOURS Performed at [facility], [address] [place], [place]  [address]    Report Status PENDING  Incomplete  Culture, blood (routine x 2)     Status: None (Preliminary result)   Collection Time: 01/09/18  2:01 PM  Result Value Ref Range Status   Specimen Description BLOOD BLOOD LEFT HAND  Final   Special Requests   Final    BOTTLES DRAWN AEROBIC ONLY Blood Culture adequate volume   Culture   Final    NO GROWTH < 24 HOURS Performed at [facility], [address] [place], [place] [address]    Report Status PENDING  Incomplete         Radiology Studies: Mr Lumbar Spine Wo Contrast  Result Date: 01/09/2018 CLINICAL DATA:  Recent fall, low back pain for 2 months EXAM: MRI LUMBAR SPINE WITHOUT CONTRAST TECHNIQUE: Multiplanar, multisequence MR imaging of the lumbar spine was performed. No intravenous contrast was administered. COMPARISON:  None. FINDINGS: Segmentation:  Standard. Alignment:  Physiologic. Vertebrae:  No fracture, evidence of discitis, or bone lesion. Conus medullaris and cauda equina: Conus extends to the T12-L1 level. Conus and cauda equina appear normal. Paraspinal and other soft tissues: No acute paraspinal abnormality. Soft tissue edema in the subcutaneous fat along the posterior aspect of the back. Disc levels: Disc spaces: Disc spaces are maintained. T12-L1: No significant disc bulge. No evidence of neural foraminal stenosis. No central canal stenosis. L1-L2: No significant disc bulge. No evidence of neural foraminal stenosis. No central canal stenosis. L2-L3: No significant disc bulge. No evidence of neural foraminal stenosis. No central canal stenosis. L3-L4: No significant disc bulge. No evidence of neural foraminal stenosis. No central canal  stenosis. L4-L5: No significant disc bulge. No evidence of neural foraminal stenosis. No central canal stenosis. L5-S1: No significant disc bulge. No evidence of neural foraminal stenosis. No central canal stenosis. IMPRESSION: 1.  No acute osseous injury of the lumbar spine. 2. No discitis or  osteomyelitis of the lumbar spine. Electronically Signed   By: Elige Ko   On: 01/09/2018 20:31        Scheduled Meds: . budesonide (PULMICORT) nebulizer solution  0.25 mg Nebulization BID  . cyclobenzaprine  5 mg Oral TID  . enoxaparin (LOVENOX) injection  40 mg Subcutaneous Q24H  . guaiFENesin  1,200 mg Oral BID  . insulin aspart  0-9 Units Subcutaneous TID WC  . ipratropium  0.5 mg Nebulization Q6H  . levalbuterol  1.25 mg Nebulization Q6H  . mouth rinse  15 mL Mouth Rinse BID  . [START ON 01/12/2018] methylPREDNISolone (SOLU-MEDROL) injection  60 mg Intravenous Q12H  . metoprolol tartrate  25 mg Oral BID  . nicotine  14 mg Transdermal Daily  . pantoprazole  40 mg Oral Daily   Continuous Infusions: .  ceFAZolin (ANCEF) IV 2 g (01/11/18 1430)     LOS: 5 days    Time spent:    Erick Blinks, MD [facility] Pager [phone]  If 7PM-7AM, please contact night-coverage [email or web address] Password [facility] 01/11/2018, 4:56 PM

## 2018-01-12 DIAGNOSIS — I10 Essential (primary) hypertension: Secondary | ICD-10-CM

## 2018-01-12 LAB — CBC WITH DIFFERENTIAL/PLATELET
Abs Immature Granulocytes: 0.58 10*3/uL — ABNORMAL HIGH (ref 0.00–0.07)
BASOS ABS: 0 10*3/uL (ref 0.0–0.1)
Basophils Relative: 0 %
EOS ABS: 0 10*3/uL (ref 0.0–0.5)
EOS PCT: 0 %
HCT: 30.8 % — ABNORMAL LOW (ref 36.0–46.0)
HEMOGLOBIN: 9.9 g/dL — AB (ref 12.0–15.0)
IMMATURE GRANULOCYTES: 7 %
LYMPHS PCT: 8 %
Lymphs Abs: 0.7 10*3/uL (ref 0.7–4.0)
MCH: 29.6 pg (ref 26.0–34.0)
MCHC: 32.1 g/dL (ref 30.0–36.0)
MCV: 92.2 fL (ref 80.0–100.0)
Monocytes Absolute: 0.2 10*3/uL (ref 0.1–1.0)
Monocytes Relative: 3 %
NEUTROS PCT: 82 %
NRBC: 0.5 % — AB (ref 0.0–0.2)
Neutro Abs: 6.6 10*3/uL (ref 1.7–7.7)
PLATELETS: 119 10*3/uL — AB (ref 150–400)
RBC: 3.34 MIL/uL — AB (ref 3.87–5.11)
RDW: 13.5 % (ref 11.5–15.5)
WBC: 8.1 10*3/uL (ref 4.0–10.5)

## 2018-01-12 LAB — GLUCOSE, CAPILLARY
GLUCOSE-CAPILLARY: 154 mg/dL — AB (ref 70–99)
GLUCOSE-CAPILLARY: 182 mg/dL — AB (ref 70–99)
Glucose-Capillary: 161 mg/dL — ABNORMAL HIGH (ref 70–99)

## 2018-01-12 LAB — CULTURE, BLOOD (ROUTINE X 2)
CULTURE: NO GROWTH
Special Requests: ADEQUATE

## 2018-01-12 MED ORDER — OXYCODONE-ACETAMINOPHEN 5-325 MG PO TABS
1.0000 | ORAL_TABLET | ORAL | Status: DC | PRN
  Administered 2018-01-12 – 2018-01-14 (×9): 1 via ORAL
  Filled 2018-01-12 (×9): qty 1

## 2018-01-12 MED ORDER — METHYLPREDNISOLONE SODIUM SUCC 40 MG IJ SOLR
40.0000 mg | Freq: Two times a day (BID) | INTRAMUSCULAR | Status: DC
  Administered 2018-01-12 – 2018-01-13 (×2): 40 mg via INTRAVENOUS
  Filled 2018-01-12 (×2): qty 1

## 2018-01-12 NOTE — Progress Notes (Signed)
PROGRESS NOTE    Melanie [name]  ZOX:[id number] DOB: 1965-04-09 DOA: 01/06/2018 PCP: Tanna Furry, MD   Brief Narrative:  52 year old female with a history of asthma, prior subdural hematoma, short-term memory loss, who was discharged from the hospital on 10/6 and unfortunately had to return the following day with worsening shortness of breath and fever.  Found to have worsening pneumonia.  Was also felt that she had an asthma exacerbation.  She was started on IV antibiotics as well as intravenous steroids.  She required BiPAP on admission.  She is slowly improving.  Assessment & Plan:   Principal Problem:   HCAP (healthcare-associated pneumonia) Active Problems:   Asthma exacerbation   Anemia   Hypertension   GERD (gastroesophageal reflux disease)   Tobacco use   Abnormal EKG   Tachycardia   Bacteremia due to Staphylococcus aureus  1. Acute respiratory failure with hypoxia.  Related to pneumonia and asthma exacerbation.  Required BiPAP on admission, but has since been weaned down to room air.  Pt has refused further bipap per RT team.  2. Healthcare associated pneumonia.  Initially started on vancomycin and cefepime on admission.  MRSA PCR was negative so vancomycin was discontinued.  Currently on intravenous Ancef based on blood culture results.  Clinically she is improving. 3. MSSA bacteremia.  Patient had 1 out of 2 positive blood cultures for MSSA bacteremia.  Repeat cultures done on 10/10 did not show any growth.  2D echocardiogram is unrevealing.  She will need TEE on Monday to help determine length of antibiotic course.  If TEE is negative, she could probably be treated with 2 weeks of intravenous Ancef.  She will need PICC line placement. 4. Asthma exacerbation.  Wheezing is better.  Will decrease steroid dosing.  Continue on bronchodilators.. 5. Sinus tachycardia.  Likely related to volume depletion/reactive to pneumonia and hypoxia.  Now resolved 6. GERD.  Continue  on PPI.   7. Tobacco use.  Nicotine replacement therapy has been offered. 8. Low back pain.  MRI lumbar spine did not show any significant disease.  May have muscle strain.  Treat supportively with pain medications and Flexeril.  Will need outpatient follow-up  DVT prophylaxis: Lovenox Code Status: Full code Family Communication: No family present Disposition Plan: PT eval requested for disposition  Consultants:   Cardiology for TEE  Procedures:  Echocardiogram: - Left ventricle: The cavity size was normal. Wall thickness was  normal. Systolic function was normal. The estimated ejection fraction was in the range of 60% to 65%. Wall motion was normal;   there were no regional wall motion abnormalities. Left  ventricular diastolic function parameters were normal. - Mitral valve: Mildly calcified annulus. There was trivial regurgitation. - Right atrium: Central venous pressure (est): 3 mm Hg. - Atrial septum: There was increased thickness of the septum,   consistent with lipomatous hypertrophy. No defect or patent foramen ovale was identified. - Tricuspid valve: There was trivial regurgitation. - Pulmonary arteries: Systolic pressure could not be accurately estimated. - Pericardium, extracardiac: A prominent pericardial fat pad was present.  Antimicrobials:   Cefepime 10/7 > 10/10  Ancef 10/10>  Subjective: Pt says she is not feeling as well today.  Had some nausea overnight.  No emesis.   Objective: Vitals:   01/11/18 2003 01/11/18 2203 01/12/18 0608 01/12/18 0748  BP:  102/73 131/69   Pulse:  86 (!) 56   Resp:  20 20   Temp:  98.6 F (37 C) 97.8 F (36.6 C)  TempSrc:  Oral Oral   SpO2: 95% 97% 96% 94%  Weight:      Height:        Intake/Output Summary (Last 24 hours) at 01/12/2018 0847 Last data filed at 01/11/2018 1809 Gross per 24 hour  Intake 480 ml  Output 600 ml  Net -120 ml   Filed Weights   01/07/18 0500 01/08/18 0400 01/09/18 0500  Weight: 63.8 kg 68.1  kg 70.7 kg    Examination:  General exam: Alert, awake, oriented x 3, NAD.  Respiratory system: Scattered rhonchi, wheezing resolved. Respiratory effort normal. Cardiovascular system:normal s1,s2 sounds. No murmurs, rubs, gallops. Gastrointestinal system: Abdomen is nondistended, soft and nontender. No organomegaly or masses felt. Normal bowel sounds heard. Central nervous system: Alert and oriented. No focal neurological deficits. Extremities: trace bilateral pedal edema, +pedal pulses Skin: No rashes, lesions or ulcers Psychiatry: Judgement and insight appear normal. Mood & affect appropriate.    Data Reviewed: I have personally reviewed following labs and imaging studies  CBC: Recent Labs  Lab 01/08/18 0442 01/09/18 0400 01/10/18 0608 01/11/18 0755 01/12/18 0529  WBC 5.8 6.2 6.0 6.8 8.1  NEUTROABS 4.9 5.2 5.0 5.7 6.6  HGB 9.8* 9.2* 9.5* 10.2* 9.9*  HCT 30.8* 29.8* 31.4* 31.4* 30.8*  MCV 94.8 94.0 94.6 92.4 92.2  PLT 123* 129* 126* 122* 119*   Basic Metabolic Panel: Recent Labs  Lab 01/06/18 1828 01/06/18 2025 01/07/18 0425 01/08/18 0442 01/09/18 0400 01/10/18 0608  NA 137 137 140 138 140 137  K 4.2 4.2 4.0 3.6 4.1 3.9  CL 106 105 108 107 111 107  CO2 22 22 25 24 23 23   GLUCOSE 82 97 101* 225* 216* 233*  BUN 11 11 14  23* 22* 14  CREATININE 0.75 0.75 0.61 0.71 0.67 0.60  CALCIUM 8.2* 8.0* 8.0* 7.7* 7.7* 7.2*  MG 1.4*  --   --   --   --   --   PHOS 2.5  --   --   --   --   --    GFR: Estimated Creatinine Clearance: 68.8 mL/min (by C-G formula based on SCr of 0.6 mg/dL). Liver Function Tests: Recent Labs  Lab 01/06/18 1828 01/06/18 2025  AST 19 22  ALT 26 22  ALKPHOS 66 59  BILITOT 0.7 0.7  PROT 5.6* 5.0*  ALBUMIN 2.2* 2.0*   No results for input(s): LIPASE, AMYLASE in the last 168 hours. No results for input(s): AMMONIA in the last 168 hours. Coagulation Profile: No results for input(s): INR, PROTIME in the last 168 hours. Cardiac Enzymes: No  results for input(s): CKTOTAL, CKMB, CKMBINDEX, TROPONINI in the last 168 hours. BNP (last 3 results) No results for input(s): PROBNP in the last 8760 hours. HbA1C: Recent Labs    01/11/18 0755  HGBA1C 6.1*   CBG: Recent Labs  Lab 01/10/18 2233 01/11/18 0734 01/11/18 1156 01/11/18 1658 01/12/18 0809  GLUCAP 204* 167* 140* 203* 154*   Lipid Profile: No results for input(s): CHOL, HDL, LDLCALC, TRIG, CHOLHDL, LDLDIRECT in the last 72 hours. Thyroid Function Tests: No results for input(s): TSH, T4TOTAL, FREET4, T3FREE, THYROIDAB in the last 72 hours. Anemia Panel: No results for input(s): VITAMINB12, FOLATE, FERRITIN, TIBC, IRON, RETICCTPCT in the last 72 hours. Sepsis Labs: Recent Labs  Lab 01/06/18 1829 01/06/18 2349 01/07/18 0425  LATICACIDVEN 3.49* 2.8* 1.8    Recent Results (from the past 240 hour(s))  Blood Culture (routine x 2)     Status: None   Collection Time:  01/06/18  6:28 PM  Result Value Ref Range Status   Specimen Description BLOOD RIGHT HAND  Final   Special Requests   Final    BOTTLES DRAWN AEROBIC AND ANAEROBIC Blood Culture adequate volume   Culture   Final    NO GROWTH 6 DAYS Performed at [facility], [address] [place], [place] [address]    Report Status 01/12/2018 FINAL  Final  Blood Culture (routine x 2)     Status: Abnormal   Collection Time: 01/06/18  6:28 PM  Result Value Ref Range Status   Specimen Description   Final    BLOOD LEFT HAND Performed at [facility], [address] [place], [place] [address]    Special Requests   Final    BOTTLES DRAWN AEROBIC AND ANAEROBIC Blood Culture adequate volume Performed at [facility], [address], [place], [place] [address]    Culture  Setup Time   Final    GRAM POSITIVE COCCI IN CLUSTERS Performed at [facility] Gram Stain Report Called to,Read Back By and Verified With: SMITH,J AT 2114 BY HUFFINES,S ON 01/08/18. AEROBIC BOTTLE ONLY CRITICAL RESULT CALLED TO, READ BACK BY AND  VERIFIED WITH: Pasty Arch RN [id number] 01/09/18 A BROWNING Performed at [facility], [address] [place], [place] [address]    Culture STAPHYLOCOCCUS AUREUS (A)  Final   Report Status 01/11/2018 FINAL  Final   Organism ID, Bacteria STAPHYLOCOCCUS AUREUS  Final      Susceptibility   Staphylococcus aureus - MIC*    CIPROFLOXACIN >=8 RESISTANT Resistant     ERYTHROMYCIN >=8 RESISTANT Resistant     GENTAMICIN <=0.5 SENSITIVE Sensitive     OXACILLIN 0.5 SENSITIVE Sensitive     TETRACYCLINE <=1 SENSITIVE Sensitive     VANCOMYCIN 1 SENSITIVE Sensitive     TRIMETH/SULFA <=10 SENSITIVE Sensitive     CLINDAMYCIN <=0.25 SENSITIVE Sensitive     RIFAMPIN <=0.5 SENSITIVE Sensitive     Inducible Clindamycin NEGATIVE Sensitive     * STAPHYLOCOCCUS AUREUS  Blood Culture ID Panel (Reflexed)     Status: Abnormal   Collection Time: 01/06/18  6:28 PM  Result Value Ref Range Status   Enterococcus species NOT DETECTED NOT DETECTED Final   Listeria monocytogenes NOT DETECTED NOT DETECTED Final   Staphylococcus species DETECTED (A) NOT DETECTED Final    Comment: CRITICAL RESULT CALLED TO, READ BACK BY AND VERIFIED WITH: Pasty Arch RN [id number] 01/09/18 A BROWNING    Staphylococcus aureus (BCID) DETECTED (A) NOT DETECTED Final    Comment: Methicillin (oxacillin) susceptible Staphylococcus aureus (MSSA). Preferred therapy is anti staphylococcal beta lactam antibiotic (Cefazolin or Nafcillin), unless clinically contraindicated. CRITICAL RESULT CALLED TO, READ BACK BY AND VERIFIED WITH: Pasty Arch RN [id number] 01/09/18 A BROWNING    Methicillin resistance NOT DETECTED NOT DETECTED Final   Streptococcus species NOT DETECTED NOT DETECTED Final   Streptococcus agalactiae NOT DETECTED NOT DETECTED Final   Streptococcus pneumoniae NOT DETECTED NOT DETECTED Final   Streptococcus pyogenes NOT DETECTED NOT DETECTED Final   Acinetobacter baumannii NOT DETECTED NOT DETECTED Final   Enterobacteriaceae species NOT DETECTED NOT  DETECTED Final   Enterobacter cloacae complex NOT DETECTED NOT DETECTED Final   Escherichia coli NOT DETECTED NOT DETECTED Final   Klebsiella oxytoca NOT DETECTED NOT DETECTED Final   Klebsiella pneumoniae NOT DETECTED NOT DETECTED Final   Proteus species NOT DETECTED NOT DETECTED Final   Serratia marcescens NOT DETECTED NOT DETECTED Final   Haemophilus influenzae  NOT DETECTED NOT DETECTED Final   Neisseria meningitidis NOT DETECTED NOT DETECTED Final   Pseudomonas aeruginosa NOT DETECTED NOT DETECTED Final   Candida albicans NOT DETECTED NOT DETECTED Final   Candida glabrata NOT DETECTED NOT DETECTED Final   Candida krusei NOT DETECTED NOT DETECTED Final   Candida parapsilosis NOT DETECTED NOT DETECTED Final   Candida tropicalis NOT DETECTED NOT DETECTED Final    Comment: Performed at [facility], [address], [place], [place] [address]  MRSA PCR Screening     Status: None   Collection Time: 01/06/18  9:11 PM  Result Value Ref Range Status   MRSA by PCR NEGATIVE NEGATIVE Final    Comment:        The GeneXpert MRSA Assay (FDA approved for NASAL specimens only), is one component of a comprehensive MRSA colonization surveillance program. It is not intended to diagnose MRSA infection nor to guide or monitor treatment for MRSA infections. Performed at [facility], [address] [place], [place] [address]   Culture, blood (routine x 2)     Status: None (Preliminary result)   Collection Time: 01/09/18  1:54 PM  Result Value Ref Range Status   Specimen Description BLOOD LEFT ANTECUBITAL  Final   Special Requests   Final    BOTTLES DRAWN AEROBIC AND ANAEROBIC Blood Culture adequate volume   Culture   Final    NO GROWTH 3 DAYS Performed at [facility], [address], [place], [place] [address]    Report Status PENDING  Incomplete  Culture, blood (routine x 2)     Status: None (Preliminary result)   Collection Time: 01/09/18  2:01 PM  Result Value Ref Range Status    Specimen Description BLOOD BLOOD LEFT HAND  Final   Special Requests   Final    BOTTLES DRAWN AEROBIC ONLY Blood Culture adequate volume   Culture   Final    NO GROWTH 3 DAYS Performed at [facility], [address], [place], [place] [address]    Report Status PENDING  Incomplete    Radiology Studies: No results found.  Scheduled Meds: . budesonide (PULMICORT) nebulizer solution  0.25 mg Nebulization BID  . cyclobenzaprine  5 mg Oral TID  . enoxaparin (LOVENOX) injection  40 mg Subcutaneous Q24H  . guaiFENesin  1,200 mg Oral BID  . insulin aspart  0-9 Units Subcutaneous TID WC  . ipratropium  0.5 mg Nebulization Q6H  . levalbuterol  1.25 mg Nebulization Q6H  . mouth rinse  15 mL Mouth Rinse BID  . methylPREDNISolone (SOLU-MEDROL) injection  60 mg Intravenous Q12H  . metoprolol tartrate  25 mg Oral BID  . nicotine  14 mg Transdermal Daily  . pantoprazole  40 mg Oral Daily   Continuous Infusions: .  ceFAZolin (ANCEF) IV 2 g (01/12/18 0841)     LOS: 6 days   Time spent: 25 mins  Standley Dakins, MD [facility] Pager [phone]  If 7PM-7AM, please contact night-coverage [email or web address] Password [facility] 01/12/2018, 8:47 AM

## 2018-01-12 NOTE — Progress Notes (Signed)
Spoke with patient's nurse, Leotis Shames, RN, concerning PICC placement. Instructed we wouldn't be able to place one today. [facility] had been contacted and placed the PICC.

## 2018-01-12 NOTE — Progress Notes (Signed)
Pharmacy Antibiotic Note  Today is day #4 of cefazolin therapy for this 33 yof admitted with bactermia.  Latest blood cultures have shown no growth and patient is currently afebrile. Renal function appears stable.   Plan: Continue cefazolin 2g IV q8h  Pharmacy will continue to monitor labs, cultures  and patient progress.  Height: 4\' 11"  (149.9 cm) Weight: 155 lb 13.8 oz (70.7 kg) IBW/kg (Calculated) : 43.2  Temp (24hrs), Avg:98.2 F (36.8 C), Min:97.8 F (36.6 C), Max:98.6 F (37 C)  Recent Labs  Lab 01/06/18 1829 01/06/18 2025 01/06/18 2349 01/07/18 0425 01/08/18 0442 01/09/18 0400 01/10/18 0608 01/11/18 0755 01/12/18 0529  WBC  --   --   --  3.8* 5.8 6.2 6.0 6.8 8.1  CREATININE  --  0.75  --  0.61 0.71 0.67 0.60  --   --   LATICACIDVEN 3.49*  --  2.8* 1.8  --   --   --   --   --     Estimated Creatinine Clearance: 68.8 mL/min (by C-G formula based on SCr of 0.6 mg/dL).    No Known Allergies  Antimicrobials this admission: Vanco 10/10 >> 10/10 Cefepime 10/10 >> 10/10 Cefazolin 10/10 >>  Dose adjustments this admission: N/A  Microbiology results: 10/7 BCx: MSSA 1/2  10/7 MRSA PCR: negative 10/10 BCx2: ng x4 days  Thank you for allowing pharmacy to be a part of this patient's care.  Tama High 01/12/2018 9:40 AM

## 2018-01-13 ENCOUNTER — Inpatient Hospital Stay (HOSPITAL_COMMUNITY): Payer: Medicaid Other

## 2018-01-13 ENCOUNTER — Encounter (HOSPITAL_COMMUNITY): Payer: Self-pay | Admitting: *Deleted

## 2018-01-13 ENCOUNTER — Encounter (HOSPITAL_COMMUNITY)
Admission: EM | Disposition: A | Discharge status: Home Health | Payer: Self-pay | Source: Home / Self Care | Attending: Internal Medicine

## 2018-01-13 DIAGNOSIS — I340 Nonrheumatic mitral (valve) insufficiency: Secondary | ICD-10-CM

## 2018-01-13 DIAGNOSIS — R7881 Bacteremia: Secondary | ICD-10-CM

## 2018-01-13 HISTORY — PX: TEE WITHOUT CARDIOVERSION: SHX5443

## 2018-01-13 LAB — GLUCOSE, CAPILLARY
GLUCOSE-CAPILLARY: 244 mg/dL — AB (ref 70–99)
GLUCOSE-CAPILLARY: 237 mg/dL — AB (ref 70–99)
GLUCOSE-CAPILLARY: 183 mg/dL — AB (ref 70–99)
Glucose-Capillary: 186 mg/dL — ABNORMAL HIGH (ref 70–99)
Glucose-Capillary: 103 mg/dL — ABNORMAL HIGH (ref 70–99)
Glucose-Capillary: 128 mg/dL — ABNORMAL HIGH (ref 70–99)

## 2018-01-13 LAB — CBC WITH DIFFERENTIAL/PLATELET
Abs Immature Granulocytes: 0.57 10*3/uL — ABNORMAL HIGH (ref 0.00–0.07)
BASOS ABS: 0 10*3/uL (ref 0.0–0.1)
Basophils Relative: 0 %
Eosinophils Absolute: 0 10*3/uL (ref 0.0–0.5)
Eosinophils Relative: 0 %
HEMATOCRIT: 32.5 % — AB (ref 36.0–46.0)
Hemoglobin: 10.3 g/dL — ABNORMAL LOW (ref 12.0–15.0)
Immature Granulocytes: 7 %
LYMPHS PCT: 6 %
Lymphs Abs: 0.5 10*3/uL — ABNORMAL LOW (ref 0.7–4.0)
MCH: 28.9 pg (ref 26.0–34.0)
MCHC: 31.7 g/dL (ref 30.0–36.0)
MCV: 91 fL (ref 80.0–100.0)
Monocytes Absolute: 0.2 10*3/uL (ref 0.1–1.0)
Monocytes Relative: 3 %
NEUTROS ABS: 7.2 10*3/uL (ref 1.7–7.7)
NRBC: 0.4 % — AB (ref 0.0–0.2)
Neutrophils Relative %: 84 %
Platelets: 110 10*3/uL — ABNORMAL LOW (ref 150–400)
RBC: 3.57 MIL/uL — AB (ref 3.87–5.11)
RDW: 13.4 % (ref 11.5–15.5)
WBC: 8.5 10*3/uL (ref 4.0–10.5)

## 2018-01-13 LAB — BASIC METABOLIC PANEL
Anion gap: 7 (ref 5–15)
BUN: 16 mg/dL (ref 6–20)
CALCIUM: 7.4 mg/dL — AB (ref 8.9–10.3)
CO2: 31 mmol/L (ref 22–32)
CREATININE: 0.59 mg/dL (ref 0.44–1.00)
Chloride: 98 mmol/L (ref 98–111)
GFR calc non Af Amer: >60 mL/min (ref >60)
Glucose, Bld: 212 mg/dL — ABNORMAL HIGH (ref 70–99)
Potassium: 3.9 mmol/L (ref 3.5–5.1)
SODIUM: 136 mmol/L (ref 135–145)

## 2018-01-13 LAB — MAGNESIUM: Magnesium: 1.9 mg/dL (ref 1.7–2.4)

## 2018-01-13 SURGERY — ECHOCARDIOGRAM, TRANSESOPHAGEAL
Anesthesia: Moderate Sedation

## 2018-01-13 MED ORDER — FENTANYL CITRATE (PF) 100 MCG/2ML IJ SOLN
INTRAMUSCULAR | Status: DC | PRN
  Administered 2018-01-13 (×2): 25 ug via INTRAVENOUS

## 2018-01-13 MED ORDER — SODIUM CHLORIDE 0.9 % IV SOLN
INTRAVENOUS | Status: DC
  Administered 2018-01-13: 14:00:00 via INTRAVENOUS

## 2018-01-13 MED ORDER — LORAZEPAM 0.5 MG PO TABS: 0.5000 mg | ORAL_TABLET | Freq: Four times a day (QID) | ORAL | Status: DC | PRN

## 2018-01-13 MED ORDER — LIDOCAINE HCL URETHRAL/MUCOSAL 2 % EX GEL
CUTANEOUS | Status: DC | PRN
  Administered 2018-01-13: 1

## 2018-01-13 MED ORDER — SODIUM CHLORIDE 0.9 % IV SOLN: INTRAVENOUS | Status: DC

## 2018-01-13 MED ORDER — METHYLPREDNISOLONE SODIUM SUCC 40 MG IJ SOLR
40.0000 mg | INTRAMUSCULAR | Status: DC
  Administered 2018-01-14: 40 mg via INTRAVENOUS
  Filled 2018-01-13: qty 1

## 2018-01-13 MED ORDER — TRAZODONE HCL 50 MG PO TABS
50.0000 mg | ORAL_TABLET | Freq: Every evening | ORAL | Status: DC | PRN
  Administered 2018-01-13: 50 mg via ORAL
  Filled 2018-01-13: qty 1

## 2018-01-13 MED ORDER — MIDAZOLAM HCL 5 MG/5ML IJ SOLN
INTRAMUSCULAR | Status: AC
  Filled 2018-01-13: qty 10

## 2018-01-13 MED ORDER — BUTAMBEN-TETRACAINE-BENZOCAINE 2-2-14 % EX AERO
INHALATION_SPRAY | CUTANEOUS | Status: AC
  Filled 2018-01-13: qty 5

## 2018-01-13 MED ORDER — BUTAMBEN-TETRACAINE-BENZOCAINE 2-2-14 % EX AERO
INHALATION_SPRAY | CUTANEOUS | Status: DC | PRN
  Administered 2018-01-13: 1 via TOPICAL

## 2018-01-13 MED ORDER — MIDAZOLAM HCL 5 MG/5ML IJ SOLN
INTRAMUSCULAR | Status: DC | PRN
  Administered 2018-01-13 (×3): 2 mg via INTRAVENOUS

## 2018-01-13 MED ORDER — POLYETHYLENE GLYCOL 3350 17 G PO PACK
17.0000 g | PACK | Freq: Two times a day (BID) | ORAL | Status: DC
  Administered 2018-01-13 – 2018-01-14 (×3): 17 g via ORAL
  Filled 2018-01-13 (×3): qty 1

## 2018-01-13 MED ORDER — SODIUM CHLORIDE BACTERIOSTATIC 0.9 % IJ SOLN
INTRAMUSCULAR | Status: AC
  Filled 2018-01-13: qty 20

## 2018-01-13 MED ORDER — FENTANYL CITRATE (PF) 100 MCG/2ML IJ SOLN
INTRAMUSCULAR | Status: AC
  Filled 2018-01-13: qty 2

## 2018-01-13 MED ORDER — CYCLOBENZAPRINE HCL 10 MG PO TABS: 5.0000 mg | ORAL_TABLET | Freq: Three times a day (TID) | ORAL | Status: DC | PRN

## 2018-01-13 MED ORDER — LIDOCAINE VISCOUS HCL 2 % MT SOLN
OROMUCOSAL | Status: AC
  Filled 2018-01-13: qty 15

## 2018-01-13 NOTE — H&P (View-Only) (Signed)
PROGRESS NOTE    Melanie [name]  WUJ:[id number] DOB: February 07, 1966 DOA: 01/06/2018 PCP: Tanna Furry, MD   Brief Narrative:  52 year old female with a history of asthma, prior subdural hematoma, short-term memory loss, who was discharged from the hospital on 10/6 and unfortunately had to return the following day with worsening shortness of breath and fever.  Found to have worsening pneumonia.  Was also felt that she had an asthma exacerbation.  She was started on IV antibiotics as well as intravenous steroids.  She required BiPAP on admission.  She is slowly improving.  Assessment & Plan:   Principal Problem:   HCAP (healthcare-associated pneumonia) Active Problems:   Asthma exacerbation   Anemia   Hypertension   GERD (gastroesophageal reflux disease)   Tobacco use   Abnormal EKG   Tachycardia   Bacteremia due to Staphylococcus aureus  1. Acute respiratory failure with hypoxia.  Related to pneumonia and asthma exacerbation.  Required BiPAP on admission, but has since been weaned down to room air.  Pt has refused further bipap per RT team.  2. Healthcare associated pneumonia.  Initially started on vancomycin and cefepime on admission.  MRSA PCR was negative so vancomycin was discontinued.  Currently on intravenous Ancef based on blood culture results.  Clinically she is improving. 3. MSSA bacteremia.  Patient had 1 out of 2 positive blood cultures for MSSA bacteremia.  Repeat cultures done on 10/10 did not show any growth.  2D echocardiogram is unrevealing.  She will need TEE to help determine length of antibiotic course.  If TEE is negative, she could probably be treated with 2 weeks of intravenous Ancef.  PICC line has been placed.  I asked cardiology to evaluate patient for TEE.   4. Asthma exacerbation.  Wheezing is better.  Will further decrease steroid dosing.  Continue on bronchodilators.. 5. Sinus tachycardia.  Likely related to volume depletion/reactive to pneumonia and  hypoxia.  Now resolved 6. GERD.  Continue on PPI.   7. Tobacco use.  Nicotine replacement therapy has been offered. 8. Low back pain.  MRI lumbar spine did not show any significant disease.  Likely to have muscle strain.  Treat supportively with pain medications and Flexeril.  Obtain PT evaluation (pending).  9. Type 2 Diabetes Mellitus - continue CBG monitoring and supplemental sliding scale coverage.  A1c is 6.1%.    DVT prophylaxis: Lovenox Code Status: Full code Family Communication: No family present Disposition Plan: PT eval requested for disposition  Consultants:   Cardiology for TEE  Procedures:  Echocardiogram: - Left ventricle: The cavity size was normal. Wall thickness was  normal. Systolic function was normal. The estimated ejection fraction was in the range of 60% to 65%. Wall motion was normal;   there were no regional wall motion abnormalities. Left  ventricular diastolic function parameters were normal. - Mitral valve: Mildly calcified annulus. There was trivial regurgitation. - Right atrium: Central venous pressure (est): 3 mm Hg. - Atrial septum: There was increased thickness of the septum,   consistent with lipomatous hypertrophy. No defect or patent foramen ovale was identified. - Tricuspid valve: There was trivial regurgitation. - Pulmonary arteries: Systolic pressure could not be accurately estimated. - Pericardium, extracardiac: A prominent pericardial fat pad was present.  Antimicrobials:   Cefepime 10/7 > 10/10  Ancef 10/10>  Subjective: Pt continues to complain of back pain and headaches but has not been ambulating that much.  PICC line placement went well.  Edema in feet is improving.  Objective: Vitals:   01/12/18 1948 01/12/18 2239 01/13/18 0723 01/13/18 0934  BP:  127/67 (!) 153/81   Pulse:  76 86   Resp:  20 14   Temp:  98.6 F (37 C) 98.8 F (37.1 C)   TempSrc:  Oral Oral   SpO2: 96% 95% 95% 93%  Weight:      Height:         Intake/Output Summary (Last 24 hours) at 01/13/2018 0935 Last data filed at 01/13/2018 0300 Gross per 24 hour  Intake 1220 ml  Output 400 ml  Net 820 ml   Filed Weights   01/07/18 0500 01/08/18 0400 01/09/18 0500  Weight: 63.8 kg 68.1 kg 70.7 kg   Examination:  General exam: Alert, awake, oriented x 3, NAD.  Respiratory system: Scattered rhonchi, wheezing resolved. Respiratory effort normal. Cardiovascular system: normal s1,s2 sounds. No murmurs, rubs, gallops. Gastrointestinal system: Abdomen is nondistended, soft and nontender. No organomegaly or masses felt. Normal bowel sounds heard. Central nervous system: Alert and oriented. No focal neurological deficits. Extremities: trace bilateral pedal edema, +pedal pulses.   Skin: No rashes, lesions or ulcers.  Psychiatry: Judgement and insight appear normal. Mood & affect appropriate.   Data Reviewed: I have personally reviewed following labs and imaging studies  CBC: Recent Labs  Lab 01/09/18 0400 01/10/18 0608 01/11/18 0755 01/12/18 0529 01/13/18 0543  WBC 6.2 6.0 6.8 8.1 8.5  NEUTROABS 5.2 5.0 5.7 6.6 7.2  HGB 9.2* 9.5* 10.2* 9.9* 10.3*  HCT 29.8* 31.4* 31.4* 30.8* 32.5*  MCV 94.0 94.6 92.4 92.2 91.0  PLT 129* 126* 122* 119* 110*   Basic Metabolic Panel: Recent Labs  Lab 01/06/18 1828  01/07/18 0425 01/08/18 0442 01/09/18 0400 01/10/18 0608 01/13/18 0543  NA 137   < > 140 138 140 137 136  K 4.2   < > 4.0 3.6 4.1 3.9 3.9  CL 106   < > 108 107 111 107 98  CO2 22   < > 25 24 23 23 31   GLUCOSE 82   < > 101* 225* 216* 233* 212*  BUN 11   < > 14 23* 22* 14 16  CREATININE 0.75   < > 0.61 0.71 0.67 0.60 0.59  CALCIUM 8.2*   < > 8.0* 7.7* 7.7* 7.2* 7.4*  MG 1.4*  --   --   --   --   --  1.9  PHOS 2.5  --   --   --   --   --   --    < > = values in this interval not displayed.   GFR: Estimated Creatinine Clearance: 68.8 mL/min (by C-G formula based on SCr of 0.59 mg/dL). Liver Function Tests: Recent Labs   Lab 01/06/18 1828 01/06/18 2025  AST 19 22  ALT 26 22  ALKPHOS 66 59  BILITOT 0.7 0.7  PROT 5.6* 5.0*  ALBUMIN 2.2* 2.0*   No results for input(s): LIPASE, AMYLASE in the last 168 hours. No results for input(s): AMMONIA in the last 168 hours. Coagulation Profile: No results for input(s): INR, PROTIME in the last 168 hours. Cardiac Enzymes: No results for input(s): CKTOTAL, CKMB, CKMBINDEX, TROPONINI in the last 168 hours. BNP (last 3 results) No results for input(s): PROBNP in the last 8760 hours. HbA1C: Recent Labs    01/11/18 0755  HGBA1C 6.1*   CBG: Recent Labs  Lab 01/12/18 0809 01/12/18 1158 01/12/18 1623 01/12/18 2238 01/13/18 0802  GLUCAP 154* 182* 161* 237* 186*  Lipid Profile: No results for input(s): CHOL, HDL, LDLCALC, TRIG, CHOLHDL, LDLDIRECT in the last 72 hours. Thyroid Function Tests: No results for input(s): TSH, T4TOTAL, FREET4, T3FREE, THYROIDAB in the last 72 hours. Anemia Panel: No results for input(s): VITAMINB12, FOLATE, FERRITIN, TIBC, IRON, RETICCTPCT in the last 72 hours. Sepsis Labs: Recent Labs  Lab 01/06/18 1829 01/06/18 2349 01/07/18 0425  LATICACIDVEN 3.49* 2.8* 1.8    Recent Results (from the past 240 hour(s))  Blood Culture (routine x 2)     Status: None   Collection Time: 01/06/18  6:28 PM  Result Value Ref Range Status   Specimen Description BLOOD RIGHT HAND  Final   Special Requests   Final    BOTTLES DRAWN AEROBIC AND ANAEROBIC Blood Culture adequate volume   Culture   Final    NO GROWTH 6 DAYS Performed at [facility], [address], [place], [place] [address]    Report Status 01/12/2018 FINAL  Final  Blood Culture (routine x 2)     Status: Abnormal   Collection Time: 01/06/18  6:28 PM  Result Value Ref Range Status   Specimen Description   Final    BLOOD LEFT HAND Performed at [facility], [address] [place], [place] [address]    Special Requests   Final    BOTTLES DRAWN AEROBIC AND ANAEROBIC Blood  Culture adequate volume Performed at [facility], [address] [place], [place] [address]    Culture  Setup Time   Final    GRAM POSITIVE COCCI IN CLUSTERS Performed at [facility] Gram Stain Report Called to,Read Back By and Verified With: SMITH,J AT 2114 BY HUFFINES,S ON 01/08/18. AEROBIC BOTTLE ONLY CRITICAL RESULT CALLED TO, READ BACK BY AND VERIFIED WITH: Pasty Arch RN [id number] 01/09/18 A BROWNING Performed at [facility], [address], [place], [place] [address]    Culture STAPHYLOCOCCUS AUREUS (A)  Final   Report Status 01/11/2018 FINAL  Final   Organism ID, Bacteria STAPHYLOCOCCUS AUREUS  Final      Susceptibility   Staphylococcus aureus - MIC*    CIPROFLOXACIN >=8 RESISTANT Resistant     ERYTHROMYCIN >=8 RESISTANT Resistant     GENTAMICIN <=0.5 SENSITIVE Sensitive     OXACILLIN 0.5 SENSITIVE Sensitive     TETRACYCLINE <=1 SENSITIVE Sensitive     VANCOMYCIN 1 SENSITIVE Sensitive     TRIMETH/SULFA <=10 SENSITIVE Sensitive     CLINDAMYCIN <=0.25 SENSITIVE Sensitive     RIFAMPIN <=0.5 SENSITIVE Sensitive     Inducible Clindamycin NEGATIVE Sensitive     * STAPHYLOCOCCUS AUREUS  Blood Culture ID Panel (Reflexed)     Status: Abnormal   Collection Time: 01/06/18  6:28 PM  Result Value Ref Range Status   Enterococcus species NOT DETECTED NOT DETECTED Final   Listeria monocytogenes NOT DETECTED NOT DETECTED Final   Staphylococcus species DETECTED (A) NOT DETECTED Final    Comment: CRITICAL RESULT CALLED TO, READ BACK BY AND VERIFIED WITH: Pasty Arch RN 2130 01/09/18 A BROWNING    Staphylococcus aureus (BCID) DETECTED (A) NOT DETECTED Final    Comment: Methicillin (oxacillin) susceptible Staphylococcus aureus (MSSA). Preferred therapy is anti staphylococcal beta lactam antibiotic (Cefazolin or Nafcillin), unless clinically contraindicated. CRITICAL RESULT CALLED TO, READ BACK BY AND VERIFIED WITH: Pasty Arch RN [id number] 01/09/18 A BROWNING    Methicillin resistance NOT  DETECTED NOT DETECTED Final   Streptococcus species NOT DETECTED NOT DETECTED Final   Streptococcus agalactiae NOT DETECTED NOT DETECTED Final  Streptococcus pneumoniae NOT DETECTED NOT DETECTED Final   Streptococcus pyogenes NOT DETECTED NOT DETECTED Final   Acinetobacter baumannii NOT DETECTED NOT DETECTED Final   Enterobacteriaceae species NOT DETECTED NOT DETECTED Final   Enterobacter cloacae complex NOT DETECTED NOT DETECTED Final   Escherichia coli NOT DETECTED NOT DETECTED Final   Klebsiella oxytoca NOT DETECTED NOT DETECTED Final   Klebsiella pneumoniae NOT DETECTED NOT DETECTED Final   Proteus species NOT DETECTED NOT DETECTED Final   Serratia marcescens NOT DETECTED NOT DETECTED Final   Haemophilus influenzae NOT DETECTED NOT DETECTED Final   Neisseria meningitidis NOT DETECTED NOT DETECTED Final   Pseudomonas aeruginosa NOT DETECTED NOT DETECTED Final   Candida albicans NOT DETECTED NOT DETECTED Final   Candida glabrata NOT DETECTED NOT DETECTED Final   Candida krusei NOT DETECTED NOT DETECTED Final   Candida parapsilosis NOT DETECTED NOT DETECTED Final   Candida tropicalis NOT DETECTED NOT DETECTED Final    Comment: Performed at [facility], [address], [place], [place] [address]  MRSA PCR Screening     Status: None   Collection Time: 01/06/18  9:11 PM  Result Value Ref Range Status   MRSA by PCR NEGATIVE NEGATIVE Final    Comment:        The GeneXpert MRSA Assay (FDA approved for NASAL specimens only), is one component of a comprehensive MRSA colonization surveillance program. It is not intended to diagnose MRSA infection nor to guide or monitor treatment for MRSA infections. Performed at [facility], [address], [place], [place] [address]   Culture, blood (routine x 2)     Status: None (Preliminary result)   Collection Time: 01/09/18  1:54 PM  Result Value Ref Range Status   Specimen Description BLOOD LEFT ANTECUBITAL  Final   Special Requests    Final    BOTTLES DRAWN AEROBIC AND ANAEROBIC Blood Culture adequate volume   Culture   Final    NO GROWTH 4 DAYS Performed at [facility], [address] [place], [place] [address]    Report Status PENDING  Incomplete  Culture, blood (routine x 2)     Status: None (Preliminary result)   Collection Time: 01/09/18  2:01 PM  Result Value Ref Range Status   Specimen Description BLOOD BLOOD LEFT HAND  Final   Special Requests   Final    BOTTLES DRAWN AEROBIC ONLY Blood Culture adequate volume   Culture   Final    NO GROWTH 4 DAYS Performed at [facility], [address], [place], [place] [address]    Report Status PENDING  Incomplete    Radiology Studies: No results found.  Scheduled Meds: . budesonide (PULMICORT) nebulizer solution  0.25 mg Nebulization BID  . cyclobenzaprine  5 mg Oral TID  . enoxaparin (LOVENOX) injection  40 mg Subcutaneous Q24H  . guaiFENesin  1,200 mg Oral BID  . insulin aspart  0-9 Units Subcutaneous TID WC  . ipratropium  0.5 mg Nebulization Q6H  . levalbuterol  1.25 mg Nebulization Q6H  . mouth rinse  15 mL Mouth Rinse BID  . methylPREDNISolone (SOLU-MEDROL) injection  40 mg Intravenous Q12H  . metoprolol tartrate  25 mg Oral BID  . nicotine  14 mg Transdermal Daily  . pantoprazole  40 mg Oral Daily  . polyethylene glycol  17 g Oral BID   Continuous Infusions: .  ceFAZolin (ANCEF) IV Stopped (01/13/18 0829)     LOS: 7 days   Time spent: 25 mins  Standley Dakins, MD [facility] Pager [phone]  If 7PM-7AM, please contact night-coverage [email or web address] Password [email or web address] 01/13/2018, 9:35 AM

## 2018-01-13 NOTE — Progress Notes (Signed)
*  PRELIMINARY RESULTS* Echocardiogram Echocardiogram Transesophageal has been performed.  Stacey Drain 01/13/2018, 3:26 PM

## 2018-01-13 NOTE — Progress Notes (Signed)
PHARMACY CONSULT NOTE FOR:  OUTPATIENT  PARENTERAL ANTIBIOTIC THERAPY (OPAT)  Indication: bacteremia  Regimen: ancef 2gm iv q8h x 14 days  End date: 01/27/18  IV antibiotic discharge orders are pended. To discharging provider:  please sign these orders via discharge navigator,  Select New Orders & click on the button choice - Manage This Unsigned Work.     Thank you for allowing pharmacy to be a part of this patient's care.  Melanie [name] 01/13/2018, 3:36 PM

## 2018-01-13 NOTE — Progress Notes (Signed)
PROGRESS NOTE    Melanie [name]  MRN:[id number] DOB: 08/26/1965 DOA: 01/06/2018 PCP: Zhou-Talbert, Serena S, MD   Brief Narrative:  52-year-old female with a history of asthma, prior subdural hematoma, short-term memory loss, who was discharged from the hospital on 10/6 and unfortunately had to return the following day with worsening shortness of breath and fever.  Found to have worsening pneumonia.  Was also felt that she had an asthma exacerbation.  She was started on IV antibiotics as well as intravenous steroids.  She required BiPAP on admission.  She is slowly improving.  Assessment & Plan:   Principal Problem:   HCAP (healthcare-associated pneumonia) Active Problems:   Asthma exacerbation   Anemia   Hypertension   GERD (gastroesophageal reflux disease)   Tobacco use   Abnormal EKG   Tachycardia   Bacteremia due to Staphylococcus aureus  1. Acute respiratory failure with hypoxia.  Related to pneumonia and asthma exacerbation.  Required BiPAP on admission, but has since been weaned down to room air.  Pt has refused further bipap per RT team.  2. Healthcare associated pneumonia.  Initially started on vancomycin and cefepime on admission.  MRSA PCR was negative so vancomycin was discontinued.  Currently on intravenous Ancef based on blood culture results.  Clinically she is improving. 3. MSSA bacteremia.  Patient had 1 out of 2 positive blood cultures for MSSA bacteremia.  Repeat cultures done on 10/10 did not show any growth.  2D echocardiogram is unrevealing.  She will need TEE to help determine length of antibiotic course.  If TEE is negative, she could probably be treated with 2 weeks of intravenous Ancef.  PICC line has been placed.  I asked cardiology to evaluate patient for TEE.   4. Asthma exacerbation.  Wheezing is better.  Will further decrease steroid dosing.  Continue on bronchodilators.. 5. Sinus tachycardia.  Likely related to volume depletion/reactive to pneumonia and  hypoxia.  Now resolved 6. GERD.  Continue on PPI.   7. Tobacco use.  Nicotine replacement therapy has been offered. 8. Low back pain.  MRI lumbar spine did not show any significant disease.  Likely to have muscle strain.  Treat supportively with pain medications and Flexeril.  Obtain PT evaluation (pending).  9. Type 2 Diabetes Mellitus - continue CBG monitoring and supplemental sliding scale coverage.  A1c is 6.1%.    DVT prophylaxis: Lovenox Code Status: Full code Family Communication: No family present Disposition Plan: PT eval requested for disposition  Consultants:   Cardiology for TEE  Procedures:  Echocardiogram: - Left ventricle: The cavity size was normal. Wall thickness was  normal. Systolic function was normal. The estimated ejection fraction was in the range of 60% to 65%. Wall motion was normal;   there were no regional wall motion abnormalities. Left  ventricular diastolic function parameters were normal. - Mitral valve: Mildly calcified annulus. There was trivial regurgitation. - Right atrium: Central venous pressure (est): 3 mm Hg. - Atrial septum: There was increased thickness of the septum,   consistent with lipomatous hypertrophy. No defect or patent foramen ovale was identified. - Tricuspid valve: There was trivial regurgitation. - Pulmonary arteries: Systolic pressure could not be accurately estimated. - Pericardium, extracardiac: A prominent pericardial fat pad was present.  Antimicrobials:   Cefepime 10/7 > 10/10  Ancef 10/10>  Subjective: Pt continues to complain of back pain and headaches but has not been ambulating that much.  PICC line placement went well.  Edema in feet is improving.       Objective: Vitals:   01/12/18 1948 01/12/18 2239 01/13/18 0723 01/13/18 0934  BP:  127/67 (!) 153/81   Pulse:  76 86   Resp:  20 14   Temp:  98.6 F (37 C) 98.8 F (37.1 C)   TempSrc:  Oral Oral   SpO2: 96% 95% 95% 93%  Weight:      Height:         Intake/Output Summary (Last 24 hours) at 01/13/2018 0935 Last data filed at 01/13/2018 0300 Gross per 24 hour  Intake 1220 ml  Output 400 ml  Net 820 ml   Filed Weights   01/07/18 0500 01/08/18 0400 01/09/18 0500  Weight: 63.8 kg 68.1 kg 70.7 kg   Examination:  General exam: Alert, awake, oriented x 3, NAD.  Respiratory system: Scattered rhonchi, wheezing resolved. Respiratory effort normal. Cardiovascular system: normal s1,s2 sounds. No murmurs, rubs, gallops. Gastrointestinal system: Abdomen is nondistended, soft and nontender. No organomegaly or masses felt. Normal bowel sounds heard. Central nervous system: Alert and oriented. No focal neurological deficits. Extremities: trace bilateral pedal edema, +pedal pulses.   Skin: No rashes, lesions or ulcers.  Psychiatry: Judgement and insight appear normal. Mood & affect appropriate.   Data Reviewed: I have personally reviewed following labs and imaging studies  CBC: Recent Labs  Lab 01/09/18 0400 01/10/18 0608 01/11/18 0755 01/12/18 0529 01/13/18 0543  WBC 6.2 6.0 6.8 8.1 8.5  NEUTROABS 5.2 5.0 5.7 6.6 7.2  HGB 9.2* 9.5* 10.2* 9.9* 10.3*  HCT 29.8* 31.4* 31.4* 30.8* 32.5*  MCV 94.0 94.6 92.4 92.2 91.0  PLT 129* 126* 122* 119* 110*   Basic Metabolic Panel: Recent Labs  Lab 01/06/18 1828  01/07/18 0425 01/08/18 0442 01/09/18 0400 01/10/18 0608 01/13/18 0543  NA 137   < > 140 138 140 137 136  K 4.2   < > 4.0 3.6 4.1 3.9 3.9  CL 106   < > 108 107 111 107 98  CO2 22   < > 25 24 23 23 31  GLUCOSE 82   < > 101* 225* 216* 233* 212*  BUN 11   < > 14 23* 22* 14 16  CREATININE 0.75   < > 0.61 0.71 0.67 0.60 0.59  CALCIUM 8.2*   < > 8.0* 7.7* 7.7* 7.2* 7.4*  MG 1.4*  --   --   --   --   --  1.9  PHOS 2.5  --   --   --   --   --   --    < > = values in this interval not displayed.   GFR: Estimated Creatinine Clearance: 68.8 mL/min (by C-G formula based on SCr of 0.59 mg/dL). Liver Function Tests: Recent Labs   Lab 01/06/18 1828 01/06/18 2025  AST 19 22  ALT 26 22  ALKPHOS 66 59  BILITOT 0.7 0.7  PROT 5.6* 5.0*  ALBUMIN 2.2* 2.0*   No results for input(s): LIPASE, AMYLASE in the last 168 hours. No results for input(s): AMMONIA in the last 168 hours. Coagulation Profile: No results for input(s): INR, PROTIME in the last 168 hours. Cardiac Enzymes: No results for input(s): CKTOTAL, CKMB, CKMBINDEX, TROPONINI in the last 168 hours. BNP (last 3 results) No results for input(s): PROBNP in the last 8760 hours. HbA1C: Recent Labs    01/11/18 0755  HGBA1C 6.1*   CBG: Recent Labs  Lab 01/12/18 0809 01/12/18 1158 01/12/18 1623 01/12/18 2238 01/13/18 0802  GLUCAP 154* 182* 161* 237* 186*     Lipid Profile: No results for input(s): CHOL, HDL, LDLCALC, TRIG, CHOLHDL, LDLDIRECT in the last 72 hours. Thyroid Function Tests: No results for input(s): TSH, T4TOTAL, FREET4, T3FREE, THYROIDAB in the last 72 hours. Anemia Panel: No results for input(s): VITAMINB12, FOLATE, FERRITIN, TIBC, IRON, RETICCTPCT in the last 72 hours. Sepsis Labs: Recent Labs  Lab 01/06/18 1829 01/06/18 2349 01/07/18 0425  LATICACIDVEN 3.49* 2.8* 1.8    Recent Results (from the past 240 hour(s))  Blood Culture (routine x 2)     Status: None   Collection Time: 01/06/18  6:28 PM  Result Value Ref Range Status   Specimen Description BLOOD RIGHT HAND  Final   Special Requests   Final    BOTTLES DRAWN AEROBIC AND ANAEROBIC Blood Culture adequate volume   Culture   Final    NO GROWTH 6 DAYS Performed at [facility], [address] [place], [place] [address]    Report Status 01/12/2018 FINAL  Final  Blood Culture (routine x 2)     Status: Abnormal   Collection Time: 01/06/18  6:28 PM  Result Value Ref Range Status   Specimen Description   Final    BLOOD LEFT HAND Performed at [facility], [address] [place], [place] [address]    Special Requests   Final    BOTTLES DRAWN AEROBIC AND ANAEROBIC Blood  Culture adequate volume Performed at [facility], [address] [place], [place] [address]    Culture  Setup Time   Final    GRAM POSITIVE COCCI IN CLUSTERS Performed at [facility] Gram Stain Report Called to,Read Back By and Verified With: SMITH,J AT 2114 BY HUFFINES,S ON 01/08/18. AEROBIC BOTTLE ONLY CRITICAL RESULT CALLED TO, READ BACK BY AND VERIFIED WITH: J SMITH RN 0227 01/09/18 A BROWNING Performed at [facility], [address] [place], [place] [address]    Culture STAPHYLOCOCCUS AUREUS (A)  Final   Report Status 01/11/2018 FINAL  Final   Organism ID, Bacteria STAPHYLOCOCCUS AUREUS  Final      Susceptibility   Staphylococcus aureus - MIC*    CIPROFLOXACIN >=8 RESISTANT Resistant     ERYTHROMYCIN >=8 RESISTANT Resistant     GENTAMICIN <=0.5 SENSITIVE Sensitive     OXACILLIN 0.5 SENSITIVE Sensitive     TETRACYCLINE <=1 SENSITIVE Sensitive     VANCOMYCIN 1 SENSITIVE Sensitive     TRIMETH/SULFA <=10 SENSITIVE Sensitive     CLINDAMYCIN <=0.25 SENSITIVE Sensitive     RIFAMPIN <=0.5 SENSITIVE Sensitive     Inducible Clindamycin NEGATIVE Sensitive     * STAPHYLOCOCCUS AUREUS  Blood Culture ID Panel (Reflexed)     Status: Abnormal   Collection Time: 01/06/18  6:28 PM  Result Value Ref Range Status   Enterococcus species NOT DETECTED NOT DETECTED Final   Listeria monocytogenes NOT DETECTED NOT DETECTED Final   Staphylococcus species DETECTED (A) NOT DETECTED Final    Comment: CRITICAL RESULT CALLED TO, READ BACK BY AND VERIFIED WITH: J SMITH RN 0227 01/09/18 A BROWNING    Staphylococcus aureus (BCID) DETECTED (A) NOT DETECTED Final    Comment: Methicillin (oxacillin) susceptible Staphylococcus aureus (MSSA). Preferred therapy is anti staphylococcal beta lactam antibiotic (Cefazolin or Nafcillin), unless clinically contraindicated. CRITICAL RESULT CALLED TO, READ BACK BY AND VERIFIED WITH: J SMITH RN 0227 01/09/18 A BROWNING    Methicillin resistance NOT  DETECTED NOT DETECTED Final   Streptococcus species NOT DETECTED NOT DETECTED Final   Streptococcus agalactiae NOT DETECTED NOT DETECTED Final     Streptococcus pneumoniae NOT DETECTED NOT DETECTED Final   Streptococcus pyogenes NOT DETECTED NOT DETECTED Final   Acinetobacter baumannii NOT DETECTED NOT DETECTED Final   Enterobacteriaceae species NOT DETECTED NOT DETECTED Final   Enterobacter cloacae complex NOT DETECTED NOT DETECTED Final   Escherichia coli NOT DETECTED NOT DETECTED Final   Klebsiella oxytoca NOT DETECTED NOT DETECTED Final   Klebsiella pneumoniae NOT DETECTED NOT DETECTED Final   Proteus species NOT DETECTED NOT DETECTED Final   Serratia marcescens NOT DETECTED NOT DETECTED Final   Haemophilus influenzae NOT DETECTED NOT DETECTED Final   Neisseria meningitidis NOT DETECTED NOT DETECTED Final   Pseudomonas aeruginosa NOT DETECTED NOT DETECTED Final   Candida albicans NOT DETECTED NOT DETECTED Final   Candida glabrata NOT DETECTED NOT DETECTED Final   Candida krusei NOT DETECTED NOT DETECTED Final   Candida parapsilosis NOT DETECTED NOT DETECTED Final   Candida tropicalis NOT DETECTED NOT DETECTED Final    Comment: Performed at [facility], [address] [place], [place] [address]  MRSA PCR Screening     Status: None   Collection Time: 01/06/18  9:11 PM  Result Value Ref Range Status   MRSA by PCR NEGATIVE NEGATIVE Final    Comment:        The GeneXpert MRSA Assay (FDA approved for NASAL specimens only), is one component of a comprehensive MRSA colonization surveillance program. It is not intended to diagnose MRSA infection nor to guide or monitor treatment for MRSA infections. Performed at [facility], [address] [place], [place] [address]   Culture, blood (routine x 2)     Status: None (Preliminary result)   Collection Time: 01/09/18  1:54 PM  Result Value Ref Range Status   Specimen Description BLOOD LEFT ANTECUBITAL  Final   Special Requests    Final    BOTTLES DRAWN AEROBIC AND ANAEROBIC Blood Culture adequate volume   Culture   Final    NO GROWTH 4 DAYS Performed at [facility], [address] [place], [place] [address]    Report Status PENDING  Incomplete  Culture, blood (routine x 2)     Status: None (Preliminary result)   Collection Time: 01/09/18  2:01 PM  Result Value Ref Range Status   Specimen Description BLOOD BLOOD LEFT HAND  Final   Special Requests   Final    BOTTLES DRAWN AEROBIC ONLY Blood Culture adequate volume   Culture   Final    NO GROWTH 4 DAYS Performed at [facility], [address] [place], [place] [address]    Report Status PENDING  Incomplete    Radiology Studies: No results found.  Scheduled Meds: . budesonide (PULMICORT) nebulizer solution  0.25 mg Nebulization BID  . cyclobenzaprine  5 mg Oral TID  . enoxaparin (LOVENOX) injection  40 mg Subcutaneous Q24H  . guaiFENesin  1,200 mg Oral BID  . insulin aspart  0-9 Units Subcutaneous TID WC  . ipratropium  0.5 mg Nebulization Q6H  . levalbuterol  1.25 mg Nebulization Q6H  . mouth rinse  15 mL Mouth Rinse BID  . methylPREDNISolone (SOLU-MEDROL) injection  40 mg Intravenous Q12H  . metoprolol tartrate  25 mg Oral BID  . nicotine  14 mg Transdermal Daily  . pantoprazole  40 mg Oral Daily  . polyethylene glycol  17 g Oral BID   Continuous Infusions: .  ceFAZolin (ANCEF) IV Stopped (01/13/18 0829)     LOS: 7 days   Time spent: 25 mins    Clanford Johnson, MD [facility] Pager [phone]  If 7PM-7AM, please contact night-coverage [email or web address] Password [email or web address] 01/13/2018, 9:35 AM   

## 2018-01-13 NOTE — Interval H&P Note (Signed)
History and Physical Interval Note: No changes. Will proceed with TEE. Pt interviewed and examined.  01/13/2018 1:08 PM  Melanie [name]  has presented today for surgery, with the diagnosis of bacteremia  The various methods of treatment have been discussed with the patient and family. After consideration of risks, benefits and other options for treatment, the patient has consented to  Procedure(s): TRANSESOPHAGEAL ECHOCARDIOGRAM (TEE) (N/A) as a surgical intervention .  The patient's history has been reviewed, patient examined, no change in status, stable for surgery.  I have reviewed the patient's chart and labs.  Questions were answered to the patient's satisfaction.     Prentice Docker

## 2018-01-13 NOTE — Care Management (Signed)
Patient to have TEE today. Unclear if patient will be able to return home with IV antibiotics. Discussed during progression rounds that patient may need placement for IV antibiotics.

## 2018-01-13 NOTE — Procedures (Signed)
During this procedure the patient is administered a total of Versed 6 mg and Fentanyl 50 mcg to achieve and maintain moderate conscious sedation.  The patient's heart rate, blood pressure, and oxygen saturation are monitored continuously during the procedure. The period of conscious sedation is 30 minutes, of which I was present face-to-face 100% of this time.  Preliminary TEE findings: 1. Normal LV systolic function, LVEF 60%. 2. Mild mitral regurgitation 3. No vegetations  *Full report to follow

## 2018-01-14 DIAGNOSIS — R7881 Bacteremia: Secondary | ICD-10-CM

## 2018-01-14 LAB — CBC WITH DIFFERENTIAL/PLATELET
Abs Immature Granulocytes: 0.44 10*3/uL — ABNORMAL HIGH (ref 0.00–0.07)
BASOS PCT: 0 %
Basophils Absolute: 0 10*3/uL (ref 0.0–0.1)
EOS ABS: 0 10*3/uL (ref 0.0–0.5)
Eosinophils Relative: 0 %
HCT: 32.5 % — ABNORMAL LOW (ref 36.0–46.0)
Hemoglobin: 10.3 g/dL — ABNORMAL LOW (ref 12.0–15.0)
Immature Granulocytes: 4 %
Lymphocytes Relative: 8 %
Lymphs Abs: 0.8 10*3/uL (ref 0.7–4.0)
MCH: 29.1 pg (ref 26.0–34.0)
MCHC: 31.7 g/dL (ref 30.0–36.0)
MCV: 91.8 fL (ref 80.0–100.0)
MONOS PCT: 2 %
Monocytes Absolute: 0.2 10*3/uL (ref 0.1–1.0)
NRBC: 0.6 % — AB (ref 0.0–0.2)
Neutro Abs: 9.2 10*3/uL — ABNORMAL HIGH (ref 1.7–7.7)
Neutrophils Relative %: 86 %
PLATELETS: 98 10*3/uL — AB (ref 150–400)
RBC: 3.54 MIL/uL — AB (ref 3.87–5.11)
RDW: 13.6 % (ref 11.5–15.5)
WBC: 10.7 10*3/uL — AB (ref 4.0–10.5)

## 2018-01-14 LAB — GLUCOSE, CAPILLARY
GLUCOSE-CAPILLARY: 120 mg/dL — AB (ref 70–99)
Glucose-Capillary: 190 mg/dL — ABNORMAL HIGH (ref 70–99)
Glucose-Capillary: 201 mg/dL — ABNORMAL HIGH (ref 70–99)

## 2018-01-14 LAB — CULTURE, BLOOD (ROUTINE X 2)
Culture: NO GROWTH
Culture: NO GROWTH
SPECIAL REQUESTS: ADEQUATE
Special Requests: ADEQUATE

## 2018-01-14 MED ORDER — LORAZEPAM 0.5 MG PO TABS: 0.5000 mg | ORAL_TABLET | Freq: Four times a day (QID) | ORAL | Status: DC | PRN

## 2018-01-14 MED ORDER — CYCLOBENZAPRINE HCL 5 MG PO TABS: 5.0000 mg | ORAL_TABLET | Freq: Three times a day (TID) | ORAL | 0 refills | Status: DC | PRN

## 2018-01-14 MED ORDER — OXYCODONE-ACETAMINOPHEN 5-325 MG PO TABS: 1.0000 | ORAL_TABLET | Freq: Three times a day (TID) | ORAL | 0 refills | Status: AC | PRN

## 2018-01-14 MED ORDER — CEFAZOLIN SODIUM-DEXTROSE 2-4 GM/100ML-% IV SOLN: 2.0000 g | Freq: Three times a day (TID) | INTRAVENOUS | 0 refills | Status: AC

## 2018-01-14 MED ORDER — METOPROLOL TARTRATE 25 MG PO TABS: 25.0000 mg | ORAL_TABLET | Freq: Two times a day (BID) | ORAL | 0 refills | Status: DC

## 2018-01-14 MED ORDER — POLYETHYLENE GLYCOL 3350 17 G PO PACK: 17.0000 g | PACK | Freq: Every day | ORAL | 0 refills | Status: DC

## 2018-01-14 MED ORDER — PREDNISONE 20 MG PO TABS: ORAL_TABLET | ORAL | 0 refills | Status: AC

## 2018-01-14 MED ORDER — PANTOPRAZOLE SODIUM 40 MG PO TBEC: 40.0000 mg | DELAYED_RELEASE_TABLET | Freq: Every day | ORAL | 0 refills | Status: DC

## 2018-01-14 NOTE — Discharge Summary (Signed)
Physician Discharge Summary  Melanie [name] KGM:[id number] DOB: [birth year] DOA: 01/06/2018  PCP: Tanna Furry, MD  Admit date: 01/06/2018 Discharge date: 01/14/2018  Admitted From: Home  Disposition: Home with HH (Refused SNF placement)  Recommendations for Outpatient Follow-up:  1. Follow up with PCP in 1 weeks  Home Health: RN, PT   Discharge Condition: STABLE   CODE STATUS: FULL    Brief Hospitalization Summary: Please see all hospital notes, images, labs for full details of the hospitalization. HPI: Melanie [name] is a 52 y.o. female with medical history significant of asthma, GERD, history of subdural hematoma following salicylate poisoning, hypertension, history of short-term memory loss, risk for falls who was discharged yesterday from [facility] due to community-acquired pneumonia very returns today to the emergency department due to progressively worse dyspnea, worsening productive cough, wheezing, pleuritic chest pain, fatigue, malaise and fever.  EMS reported that the patient initial O2 sat was 88% on room air at home.  She denies headache, rhinorrhea, sore throat, hemoptysis.  No dizziness, palpitations, diaphoresis, PND, orthopnea or pitting edema of the lower extremities.  Denies nausea, emesis, diarrhea, constipation, melena or hematochezia.  No dysuria, frequency or hematuria.  No polyuria, polydipsia, polyphagia or blurred vision.  No heat or cold intolerance.  ED Course: Initial vital signs temperature 98.9 F, but subsequently decreased to 101.9 F, pulse 129, respirations 20, blood pressure 92/67 mmHg and O2 sat 88% on room air.  The patient received 2000 mL of LR bolus, acetaminophen 615 mg p.o. x1 dose, fentanyl 50 mcg x 1 and levofloxacin 750 mg IVP x1 dose.  I added Solu-Medrol 125 mg IV x1, Toradol 30 mg IVP x1 and magnesium sulfate 2 g IVPB.  Urinalysis shows a hazy appearance and ketonuria 5 mg/dL.  All other values within normal limits.  White count was 6.0  with 90% neutrophils, 6% lymphocytes and 4% monocytes.  Hemoglobin 10.9 g/dL and platelets 644.  Lactic acid was 3.49 mmol/L.  So far her electrolytes, corrected calcium to albumin, pending magnesium and phosphorus are within normal limits.  Total protein was 5.6 and albumin 2.2 g/dL.  Hepatic and renal function are within normal limits.  A 1 view chest radiograph shows worsening right lung pneumonia.  Please see images and radiology report for further detail.  Brief Narrative:  52 year old female with a history of asthma, prior subdural hematoma, short-term memory loss, who was discharged from the hospital on 10/6 and unfortunately had to return the following day with worsening shortness of breath and fever.  Found to have worsening pneumonia.  Was also felt that she had an asthma exacerbation.  She was started on IV antibiotics as well as intravenous steroids.  She required BiPAP on admission.  She is slowly improving.  Assessment & Plan:   Principal Problem:   HCAP (healthcare-associated pneumonia) Active Problems:   Asthma exacerbation   Anemia   Hypertension   GERD (gastroesophageal reflux disease)   Tobacco use   Abnormal EKG   Tachycardia   Bacteremia due to Staphylococcus aureus  1. Acute respiratory failure with hypoxia.  Related to pneumonia and asthma exacerbation.  Required BiPAP on admission, but has since been weaned down to room air.  Pt has refused further bipap per RT team.  2. Healthcare associated pneumonia.  Initially started on vancomycin and cefepime on admission.  MRSA PCR was negative so vancomycin was discontinued.  Currently on intravenous Ancef based on blood culture results.  Clinically she is improved and symptoms resolved. 3. MSSA bacteremia.  Patient had 1 out of 2 positive blood cultures for MSSA bacteremia.  Repeat cultures done on 10/10 did not show any growth.  2D echocardiogram is unrevealing.  TEE 10/14 with no vegetations found.  Pt to be treated with 2  weeks of intravenous Ancef.  PICC line has been placed.  Pharmacist consulted and home health arranged for home IV antibiotics.   Pt refused for SNF placement to complete IV antibiotics.   4. Asthma exacerbation.  Wheezing is better.    finish out steroid course for 4 days.  Continue on bronchodilators as needed. 5. Sinus tachycardia.  Likely related to volume depletion/reactive to pneumonia and hypoxia.  Now resolved 6. GERD.  Continue on PPI.   7. Tobacco use.  Nicotine replacement therapy has been offered.  Pt counseled to please stop using tobacco.  8. Low back pain.  MRI lumbar spine did not show any significant disease.  Likely to have muscle strain.  Treated supportively with pain medications and Flexeril.  9. Type 2 Diabetes Mellitus -   A1c is 6.1%.    DVT prophylaxis: Lovenox Code Status: Full code Family Communication: No family present Disposition Plan: Home with HH, refused SNF placement.   Consultants:   Cardiology for TEE  Procedures:  Echocardiogram: - Left ventricle: The cavity size was normal. Wall thickness wasnormal. Systolic function was normal. The estimated ejectionfraction was in the range of 60% to 65%. Wall motion was normal; there were no regional wall motion abnormalities. Leftventricular diastolic function parameters were normal. - Mitral valve: Mildly calcified annulus. There was trivialregurgitation. - Right atrium: Central venous pressure (est): 3 mm Hg. - Atrial septum: There was increased thickness of the septum, consistent with lipomatous hypertrophy. No defect or patentforamen ovale was identified. - Tricuspid valve: There was trivial regurgitation. - Pulmonary arteries: Systolic pressure could not be accuratelyestimated. - Pericardium, extracardiac: A prominent pericardial fat pad waspresent.  TEE TEE WITHOUT CARDIOVERSION [[id number] (Custom)]    Signed        Added by: [x] Laqueta Linden, MD  During this procedure the  patient is administered a total of Versed 6 mg and Fentanyl 50 mcg to achieve and maintain moderate conscious sedation.  The patient's heart rate, blood pressure, and oxygen saturation are monitored continuously during the procedure. The period of conscious sedation is 30 minutes, of which I was present face-to-face 100% of this time.  Preliminary TEE findings: 1. Normal LV systolic function, LVEF 60%. 2. Mild mitral regurgitation 3. No vegetations           Antimicrobials:   Cefepime 10/7 > 10/10  Ancef 10/10>  Discharge Diagnoses:  Principal Problem:   HCAP (healthcare-associated pneumonia) Active Problems:   Asthma exacerbation   Anemia   Hypertension   GERD (gastroesophageal reflux disease)   Tobacco use   Abnormal EKG   Tachycardia   Bacteremia due to Staphylococcus aureus    Discharge Instructions: Discharge Instructions    Call MD for:  extreme fatigue   Complete by:  As directed    Call MD for:  hives   Complete by:  As directed    Call MD for:  persistant nausea and vomiting   Complete by:  As directed    Call MD for:  severe uncontrolled pain   Complete by:  As directed    Increase activity slowly   Complete by:  As directed      Allergies as of 01/14/2018   No Known Allergies     Medication  List    STOP taking these medications   levofloxacin 500 MG tablet Commonly known as:  LEVAQUIN     TAKE these medications   albuterol 108 (90 Base) MCG/ACT inhaler Commonly known as:  PROVENTIL HFA;VENTOLIN HFA Inhale 2 puffs into the lungs every 4 (four) hours as needed for wheezing or shortness of breath (or coughing).   ceFAZolin 2-4 GM/100ML-% IVPB Commonly known as:  ANCEF Inject 100 mLs (2 g total) into the vein every 8 (eight) hours for 8 days.   cyclobenzaprine 5 MG tablet Commonly known as:  FLEXERIL Take 1 tablet (5 mg total) by mouth 3 (three) times daily as needed for muscle spasms.   metoprolol tartrate 25 MG tablet Commonly known  as:  LOPRESSOR Take 1 tablet (25 mg total) by mouth 2 (two) times daily.   oxyCODONE-acetaminophen 5-325 MG tablet Commonly known as:  PERCOCET/ROXICET Take 1 tablet by mouth every 8 (eight) hours as needed for up to 3 days for severe pain.   pantoprazole 40 MG tablet Commonly known as:  PROTONIX Take 1 tablet (40 mg total) by mouth daily for 14 days. Start taking on:  01/15/2018   polyethylene glycol packet Commonly known as:  MIRALAX / GLYCOLAX Take 17 g by mouth daily.   predniSONE 20 MG tablet Commonly known as:  DELTASONE 2 tabs daily with breakfast Start taking on:  01/15/2018 What changed:    medication strength  additional instructions      Follow-up Information    Zhou-Talbert, Ralene Bathe, MD. Schedule an appointment as soon as possible for a visit in 1 week(s).   Specialty:  Family Medicine Why:  Hospital Follow Up  Contact information: [address] [place] [address] [phone]          No Known Allergies Allergies as of 01/14/2018   No Known Allergies     Medication List    STOP taking these medications   levofloxacin 500 MG tablet Commonly known as:  LEVAQUIN     TAKE these medications   albuterol 108 (90 Base) MCG/ACT inhaler Commonly known as:  PROVENTIL HFA;VENTOLIN HFA Inhale 2 puffs into the lungs every 4 (four) hours as needed for wheezing or shortness of breath (or coughing).   ceFAZolin 2-4 GM/100ML-% IVPB Commonly known as:  ANCEF Inject 100 mLs (2 g total) into the vein every 8 (eight) hours for 8 days.   cyclobenzaprine 5 MG tablet Commonly known as:  FLEXERIL Take 1 tablet (5 mg total) by mouth 3 (three) times daily as needed for muscle spasms.   metoprolol tartrate 25 MG tablet Commonly known as:  LOPRESSOR Take 1 tablet (25 mg total) by mouth 2 (two) times daily.   oxyCODONE-acetaminophen 5-325 MG tablet Commonly known as:  PERCOCET/ROXICET Take 1 tablet by mouth every 8 (eight) hours as needed for up to 3 days  for severe pain.   pantoprazole 40 MG tablet Commonly known as:  PROTONIX Take 1 tablet (40 mg total) by mouth daily for 14 days. Start taking on:  01/15/2018   polyethylene glycol packet Commonly known as:  MIRALAX / GLYCOLAX Take 17 g by mouth daily.   predniSONE 20 MG tablet Commonly known as:  DELTASONE 2 tabs daily with breakfast Start taking on:  01/15/2018 What changed:    medication strength  additional instructions       Procedures/Studies: Dg Chest 2 View  Result Date: 01/03/2018 CLINICAL DATA:  Cough congestion and wheezing.  Shortness of breath. EXAM: CHEST -  2 VIEW COMPARISON:  Chest radiograph 05/13/2017, chest CT 04/17/2017 FINDINGS: Cardiomediastinal silhouette is normal. Mediastinal contours appear intact. There is no evidence of pleural effusion or pneumothorax. Interval development of patchy and nodular airspace consolidation throughout the right lung, more confluent in the upper lobe. Scattered areas of airspace consolidation in the left lower hemithorax. Osseous structures are without acute abnormality. Soft tissues are grossly normal. IMPRESSION: Interval development of patchy and nodular airspace consolidation throughout the right lung, more confluent in the upper lobe. More subtle areas of airspace consolidation the left lower thorax. In the acute clinical settings, this likely represents multifocal pneumonia, however follow-up to resolution after empiric treatment should be considered. Electronically Signed   By: Ted Mcalpine M.D.   On: 01/03/2018 14:42   Ct Head Wo Contrast  Result Date: 01/03/2018 CLINICAL DATA:  Altered level of consciousness, fell 2 days ago, abrasions to face, history hypertension and smoking, history subdural hematoma and surgery EXAM: CT HEAD WITHOUT CONTRAST TECHNIQUE: Contiguous axial images were obtained from the base of the skull through the vertex without intravenous contrast. Sagittal and coronal MPR images reconstructed  from axial data set. COMPARISON:  01/02/2012 FINDINGS: Brain: Normal ventricular morphology. No midline shift or mass effect. 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 Otherwise normal appearance of brain parenchyma. No intracranial hemorrhage, mass lesion or evidence of acute infarction. No extra-axial fluid collections. Vascular: Unremarkable Skull: Prior LEFT frontotemporal parietal craniotomy. No acute osseous findings. 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 Prior LEFT craniotomy. No acute intracranial abnormalities. Electronically Signed   By: Ulyses Southward M.D.   On: 01/03/2018 14:32   Mr Lumbar Spine Wo Contrast  Result Date: 01/09/2018 CLINICAL DATA:  Recent fall, low back pain for 2 months EXAM: MRI LUMBAR SPINE WITHOUT CONTRAST TECHNIQUE: Multiplanar, multisequence MR imaging of the lumbar spine was performed. No intravenous contrast was administered. COMPARISON:  None. FINDINGS: Segmentation:  Standard. Alignment:  Physiologic. Vertebrae:  No fracture, evidence of discitis, or bone lesion. Conus medullaris and cauda equina: Conus extends to the T12-L1 level. Conus and cauda equina appear normal. Paraspinal and other soft tissues: No acute paraspinal abnormality. Soft tissue edema in the subcutaneous fat along the posterior aspect of the back. Disc levels: Disc spaces: Disc spaces are maintained. T12-L1: No significant disc bulge. No evidence of neural foraminal stenosis. No central canal stenosis. L1-L2: No significant disc bulge. No evidence of neural foraminal stenosis. No central canal stenosis. L2-L3: No significant disc bulge. No evidence of neural foraminal stenosis. No central canal stenosis. L3-L4: No significant disc bulge. No evidence of neural foraminal stenosis. No central canal stenosis. L4-L5: No significant disc bulge. No evidence of neural foraminal stenosis. No central canal stenosis. L5-S1:  No significant disc bulge. No evidence of neural foraminal stenosis. No central canal stenosis. IMPRESSION: 1.  No acute osseous injury of the lumbar spine. 2. No discitis or osteomyelitis of the lumbar spine. Electronically Signed   By: Elige Ko   On: 01/09/2018 20:31   Dg Chest Portable 1 View  Result Date: 01/06/2018 CLINICAL DATA:  Worsening pneumonia, shortness of breath EXAM: PORTABLE CHEST 1 VIEW COMPARISON:  Portable exam 1912 hours compared to 01/03/2018 FINDINGS: Normal heart size, mediastinal contours, and pulmonary vascularity. Increasing diffuse RIGHT lung infiltrates consistent with worsening pneumonia. LEFT lung grossly clear. No pleural effusion or pneumothorax. Bones unremarkable. IMPRESSION: Increasing RIGHT lung infiltrates consistent with worsening pneumonia. Electronically Signed   By: Loraine Leriche  Tyron Russell M.D.   On: 01/06/2018 20:17      Subjective: Pt without complaints.  Pt says that she would rather go home with home health.  Pt declines SNF.   Discharge Exam: Vitals:   01/14/18 0540 01/14/18 0717  BP: 100/60   Pulse: 87   Resp:    Temp: 98.7 F (37.1 C)   SpO2: 93% 90%   Vitals:   01/13/18 2117 01/14/18 0138 01/14/18 0540 01/14/18 0717  BP: 133/75  100/60   Pulse: (!) 102  87   Resp: 18     Temp: 98.7 F (37.1 C)  98.7 F (37.1 C)   TempSrc: Oral  Oral   SpO2: 91% (!) 86% 93% 90%  Weight:      Height:       General exam: Alert, awake, oriented x 3, NAD.  Respiratory system: Scattered rhonchi, wheezing resolved. Respiratory effort normal. Cardiovascular system: normal s1,s2 sounds. No murmurs, rubs, gallops. Gastrointestinal system: Abdomen is nondistended, soft and nontender. No organomegaly or masses felt. Normal bowel sounds heard. Central nervous system: Alert and oriented. No focal neurological deficits. Extremities: trace bilateral pedal edema, +pedal pulses.   Skin: No rashes, lesions or ulcers.  Psychiatry: Judgement and insight appear normal. Mood  & affect appropriate.    The results of significant diagnostics from this hospitalization (including imaging, microbiology, ancillary and laboratory) are listed below for reference.     Microbiology: Recent Results (from the past 240 hour(s))  Blood Culture (routine x 2)     Status: None   Collection Time: 01/06/18  6:28 PM  Result Value Ref Range Status   Specimen Description BLOOD RIGHT HAND  Final   Special Requests   Final    BOTTLES DRAWN AEROBIC AND ANAEROBIC Blood Culture adequate volume   Culture   Final    NO GROWTH 6 DAYS Performed at [facility], [address], [place], [place] [address]    Report Status 01/12/2018 FINAL  Final  Blood Culture (routine x 2)     Status: Abnormal   Collection Time: 01/06/18  6:28 PM  Result Value Ref Range Status   Specimen Description   Final    BLOOD LEFT HAND Performed at [facility], [address], [place], [place] [address]    Special Requests   Final    BOTTLES DRAWN AEROBIC AND ANAEROBIC Blood Culture adequate volume Performed at [facility], [address], [place], [place] [address]    Culture  Setup Time   Final    GRAM POSITIVE COCCI IN CLUSTERS Performed at [facility] Gram Stain Report Called to,Read Back By and Verified With: SMITH,J AT 2114 BY HUFFINES,S ON 01/08/18. AEROBIC BOTTLE ONLY CRITICAL RESULT CALLED TO, READ BACK BY AND VERIFIED WITH: Pasty Arch RN [id number] 01/09/18 A BROWNING Performed at [facility], [address] [place], [place] [address]    Culture STAPHYLOCOCCUS AUREUS (A)  Final   Report Status 01/11/2018 FINAL  Final   Organism ID, Bacteria STAPHYLOCOCCUS AUREUS  Final      Susceptibility   Staphylococcus aureus - MIC*    CIPROFLOXACIN >=8 RESISTANT Resistant     ERYTHROMYCIN >=8 RESISTANT Resistant     GENTAMICIN <=0.5 SENSITIVE Sensitive     OXACILLIN 0.5 SENSITIVE Sensitive     TETRACYCLINE <=1 SENSITIVE Sensitive     VANCOMYCIN 1 SENSITIVE Sensitive     TRIMETH/SULFA <=10  SENSITIVE Sensitive     CLINDAMYCIN <=0.25 SENSITIVE Sensitive     RIFAMPIN <=  0.5 SENSITIVE Sensitive     Inducible Clindamycin NEGATIVE Sensitive     * STAPHYLOCOCCUS AUREUS  Blood Culture ID Panel (Reflexed)     Status: Abnormal   Collection Time: 01/06/18  6:28 PM  Result Value Ref Range Status   Enterococcus species NOT DETECTED NOT DETECTED Final   Listeria monocytogenes NOT DETECTED NOT DETECTED Final   Staphylococcus species DETECTED (A) NOT DETECTED Final    Comment: CRITICAL RESULT CALLED TO, READ BACK BY AND VERIFIED WITH: Pasty Arch RN [id number] 01/09/18 A BROWNING    Staphylococcus aureus (BCID) DETECTED (A) NOT DETECTED Final    Comment: Methicillin (oxacillin) susceptible Staphylococcus aureus (MSSA). Preferred therapy is anti staphylococcal beta lactam antibiotic (Cefazolin or Nafcillin), unless clinically contraindicated. CRITICAL RESULT CALLED TO, READ BACK BY AND VERIFIED WITH: Pasty Arch RN [id number] 01/09/18 A BROWNING    Methicillin resistance NOT DETECTED NOT DETECTED Final   Streptococcus species NOT DETECTED NOT DETECTED Final   Streptococcus agalactiae NOT DETECTED NOT DETECTED Final   Streptococcus pneumoniae NOT DETECTED NOT DETECTED Final   Streptococcus pyogenes NOT DETECTED NOT DETECTED Final   Acinetobacter baumannii NOT DETECTED NOT DETECTED Final   Enterobacteriaceae species NOT DETECTED NOT DETECTED Final   Enterobacter cloacae complex NOT DETECTED NOT DETECTED Final   Escherichia coli NOT DETECTED NOT DETECTED Final   Klebsiella oxytoca NOT DETECTED NOT DETECTED Final   Klebsiella pneumoniae NOT DETECTED NOT DETECTED Final   Proteus species NOT DETECTED NOT DETECTED Final   Serratia marcescens NOT DETECTED NOT DETECTED Final   Haemophilus influenzae NOT DETECTED NOT DETECTED Final   Neisseria meningitidis NOT DETECTED NOT DETECTED Final   Pseudomonas aeruginosa NOT DETECTED NOT DETECTED Final   Candida albicans NOT DETECTED NOT DETECTED Final   Candida glabrata  NOT DETECTED NOT DETECTED Final   Candida krusei NOT DETECTED NOT DETECTED Final   Candida parapsilosis NOT DETECTED NOT DETECTED Final   Candida tropicalis NOT DETECTED NOT DETECTED Final    Comment: Performed at [facility], [address], [place], [place] [address]  MRSA PCR Screening     Status: None   Collection Time: 01/06/18  9:11 PM  Result Value Ref Range Status   MRSA by PCR NEGATIVE NEGATIVE Final    Comment:        The GeneXpert MRSA Assay (FDA approved for NASAL specimens only), is one component of a comprehensive MRSA colonization surveillance program. It is not intended to diagnose MRSA infection nor to guide or monitor treatment for MRSA infections. Performed at [facility], [address] [place], [place] [address]   Culture, blood (routine x 2)     Status: None   Collection Time: 01/09/18  1:54 PM  Result Value Ref Range Status   Specimen Description BLOOD LEFT ANTECUBITAL  Final   Special Requests   Final    BOTTLES DRAWN AEROBIC AND ANAEROBIC Blood Culture adequate volume   Culture   Final    NO GROWTH 5 DAYS Performed at [facility], [address] [place], [place] [address]    Report Status 01/14/2018 FINAL  Final  Culture, blood (routine x 2)     Status: None   Collection Time: 01/09/18  2:01 PM  Result Value Ref Range Status   Specimen Description BLOOD BLOOD LEFT HAND  Final   Special Requests   Final    BOTTLES DRAWN AEROBIC ONLY Blood Culture adequate volume   Culture   Final    NO GROWTH 5 DAYS Performed  at [facility], [address] [place], [place] [address]    Report Status 01/14/2018 FINAL  Final     Labs: BNP (last 3 results) No results for input(s): BNP in the last 8760 hours. Basic Metabolic Panel: Recent Labs  Lab 01/08/18 0442 01/09/18 0400 01/10/18 0608 01/13/18 0543  NA 138 140 137 136  K 3.6 4.1 3.9 3.9  CL 107 111 107 98  CO2 24 23 23 31   GLUCOSE 225* 216* 233* 212*  BUN 23* 22* 14 16  CREATININE 0.71  0.67 0.60 0.59  CALCIUM 7.7* 7.7* 7.2* 7.4*  MG  --   --   --  1.9   Liver Function Tests: No results for input(s): AST, ALT, ALKPHOS, BILITOT, PROT, ALBUMIN in the last 168 hours. No results for input(s): LIPASE, AMYLASE in the last 168 hours. No results for input(s): AMMONIA in the last 168 hours. CBC: Recent Labs  Lab 01/10/18 0608 01/11/18 0755 01/12/18 0529 01/13/18 0543 01/14/18 0515  WBC 6.0 6.8 8.1 8.5 10.7*  NEUTROABS 5.0 5.7 6.6 7.2 9.2*  HGB 9.5* 10.2* 9.9* 10.3* 10.3*  HCT 31.4* 31.4* 30.8* 32.5* 32.5*  MCV 94.6 92.4 92.2 91.0 91.8  PLT 126* 122* 119* 110* 98*   Cardiac Enzymes: No results for input(s): CKTOTAL, CKMB, CKMBINDEX, TROPONINI in the last 168 hours. BNP: Invalid input(s): POCBNP CBG: Recent Labs  Lab 01/13/18 1656 01/13/18 2119 01/14/18 0746 01/14/18 1144 01/14/18 1157  GLUCAP 183* 128* 120* 201* 190*   D-Dimer No results for input(s): DDIMER in the last 72 hours. Hgb A1c No results for input(s): HGBA1C in the last 72 hours. Lipid Profile No results for input(s): CHOL, HDL, LDLCALC, TRIG, CHOLHDL, LDLDIRECT in the last 72 hours. Thyroid function studies No results for input(s): TSH, T4TOTAL, T3FREE, THYROIDAB in the last 72 hours.  Invalid input(s): FREET3 Anemia work up No results for input(s): VITAMINB12, FOLATE, FERRITIN, TIBC, IRON, RETICCTPCT in the last 72 hours. Urinalysis    Component Value Date/Time   COLORURINE YELLOW 01/06/2018 1752   APPEARANCEUR HAZY (A) 01/06/2018 1752   LABSPEC 1.012 01/06/2018 1752   PHURINE 6.0 01/06/2018 1752   GLUCOSEU NEGATIVE 01/06/2018 1752   HGBUR NEGATIVE 01/06/2018 1752   BILIRUBINUR NEGATIVE 01/06/2018 1752   KETONESUR 5 (A) 01/06/2018 1752   PROTEINUR NEGATIVE 01/06/2018 1752   UROBILINOGEN 0.2 01/02/2012 1615   NITRITE NEGATIVE 01/06/2018 1752   LEUKOCYTESUR NEGATIVE 01/06/2018 1752   Sepsis Labs Invalid input(s): PROCALCITONIN,  WBC,  LACTICIDVEN Microbiology Recent Results (from  the past 240 hour(s))  Blood Culture (routine x 2)     Status: None   Collection Time: 01/06/18  6:28 PM  Result Value Ref Range Status   Specimen Description BLOOD RIGHT HAND  Final   Special Requests   Final    BOTTLES DRAWN AEROBIC AND ANAEROBIC Blood Culture adequate volume   Culture   Final    NO GROWTH 6 DAYS Performed at [facility], [address] [place], [place] [address]    Report Status 01/12/2018 FINAL  Final  Blood Culture (routine x 2)     Status: Abnormal   Collection Time: 01/06/18  6:28 PM  Result Value Ref Range Status   Specimen Description   Final    BLOOD LEFT HAND Performed at [facility], [address] [place], [place] [address]    Special Requests   Final    BOTTLES DRAWN AEROBIC AND ANAEROBIC Blood Culture adequate volume Performed at [facility], [address] [place], [place] [address]    Culture  Setup Time   Final    GRAM POSITIVE COCCI IN CLUSTERS Performed at [facility] Gram Stain Report Called to,Read Back By and Verified With: SMITH,J AT 2114 BY HUFFINES,S ON 01/08/18. AEROBIC BOTTLE ONLY CRITICAL RESULT CALLED TO, READ BACK BY AND VERIFIED WITH: Pasty Arch RN [id number] 01/09/18 A BROWNING Performed at [facility], [address], [place], [place] [address]    Culture STAPHYLOCOCCUS AUREUS (A)  Final   Report Status 01/11/2018 FINAL  Final   Organism ID, Bacteria STAPHYLOCOCCUS AUREUS  Final      Susceptibility   Staphylococcus aureus - MIC*    CIPROFLOXACIN >=8 RESISTANT Resistant     ERYTHROMYCIN >=8 RESISTANT Resistant     GENTAMICIN <=0.5 SENSITIVE Sensitive     OXACILLIN 0.5 SENSITIVE Sensitive     TETRACYCLINE <=1 SENSITIVE Sensitive     VANCOMYCIN 1 SENSITIVE Sensitive     TRIMETH/SULFA <=10 SENSITIVE Sensitive     CLINDAMYCIN <=0.25 SENSITIVE Sensitive     RIFAMPIN <=0.5 SENSITIVE Sensitive     Inducible Clindamycin NEGATIVE Sensitive     * STAPHYLOCOCCUS AUREUS  Blood Culture ID Panel (Reflexed)     Status: Abnormal    Collection Time: 01/06/18  6:28 PM  Result Value Ref Range Status   Enterococcus species NOT DETECTED NOT DETECTED Final   Listeria monocytogenes NOT DETECTED NOT DETECTED Final   Staphylococcus species DETECTED (A) NOT DETECTED Final    Comment: CRITICAL RESULT CALLED TO, READ BACK BY AND VERIFIED WITH: Pasty Arch RN [id number] 01/09/18 A BROWNING    Staphylococcus aureus (BCID) DETECTED (A) NOT DETECTED Final    Comment: Methicillin (oxacillin) susceptible Staphylococcus aureus (MSSA). Preferred therapy is anti staphylococcal beta lactam antibiotic (Cefazolin or Nafcillin), unless clinically contraindicated. CRITICAL RESULT CALLED TO, READ BACK BY AND VERIFIED WITH: Pasty Arch RN [id number] 01/09/18 A BROWNING    Methicillin resistance NOT DETECTED NOT DETECTED Final   Streptococcus species NOT DETECTED NOT DETECTED Final   Streptococcus agalactiae NOT DETECTED NOT DETECTED Final   Streptococcus pneumoniae NOT DETECTED NOT DETECTED Final   Streptococcus pyogenes NOT DETECTED NOT DETECTED Final   Acinetobacter baumannii NOT DETECTED NOT DETECTED Final   Enterobacteriaceae species NOT DETECTED NOT DETECTED Final   Enterobacter cloacae complex NOT DETECTED NOT DETECTED Final   Escherichia coli NOT DETECTED NOT DETECTED Final   Klebsiella oxytoca NOT DETECTED NOT DETECTED Final   Klebsiella pneumoniae NOT DETECTED NOT DETECTED Final   Proteus species NOT DETECTED NOT DETECTED Final   Serratia marcescens NOT DETECTED NOT DETECTED Final   Haemophilus influenzae NOT DETECTED NOT DETECTED Final   Neisseria meningitidis NOT DETECTED NOT DETECTED Final   Pseudomonas aeruginosa NOT DETECTED NOT DETECTED Final   Candida albicans NOT DETECTED NOT DETECTED Final   Candida glabrata NOT DETECTED NOT DETECTED Final   Candida krusei NOT DETECTED NOT DETECTED Final   Candida parapsilosis NOT DETECTED NOT DETECTED Final   Candida tropicalis NOT DETECTED NOT DETECTED Final    Comment: Performed at [facility], [address], [place], [place] [address]  MRSA PCR Screening     Status: None   Collection Time: 01/06/18  9:11 PM  Result Value Ref Range Status   MRSA by PCR NEGATIVE NEGATIVE Final    Comment:        The GeneXpert MRSA Assay (FDA approved for NASAL specimens only), is one component of a comprehensive MRSA colonization surveillance program.  It is not intended to diagnose MRSA infection nor to guide or monitor treatment for MRSA infections. Performed at [facility], [address], [place], [place] [address]   Culture, blood (routine x 2)     Status: None   Collection Time: 01/09/18  1:54 PM  Result Value Ref Range Status   Specimen Description BLOOD LEFT ANTECUBITAL  Final   Special Requests   Final    BOTTLES DRAWN AEROBIC AND ANAEROBIC Blood Culture adequate volume   Culture   Final    NO GROWTH 5 DAYS Performed at [facility], [address] [place], [place] [address]    Report Status 01/14/2018 FINAL  Final  Culture, blood (routine x 2)     Status: None   Collection Time: 01/09/18  2:01 PM  Result Value Ref Range Status   Specimen Description BLOOD BLOOD LEFT HAND  Final   Special Requests   Final    BOTTLES DRAWN AEROBIC ONLY Blood Culture adequate volume   Culture   Final    NO GROWTH 5 DAYS Performed at [facility], [address], [place], [place] [address]    Report Status 01/14/2018 FINAL  Final   Time coordinating discharge: 35 minutes   SIGNED:  Standley Dakins, MD  [facility] 01/14/2018, 1:29 PM Pager [phone]  If 7PM-7AM, please contact night-coverage [email or web address] Password [email or web address]

## 2018-01-14 NOTE — Discharge Instructions (Signed)
Bacteremia °Bacteremia is the presence of bacteria in the blood. When bacteria enter the bloodstream, they can cause a life-threatening reaction called sepsis, which is a medical emergency. Bacteremia can spread to other parts of the body, including the heart, joints, and brain. °What are the causes? °This condition is caused by bacteria that get into the blood. Bacteria can enter the blood: °· From a skin infection or a cut on your skin. °· During an episode of pneumonia. °· From an infection in your stomach or intestine (gastrointestinal infection). °· From an infection in your bladder or urinary system (urinary tract infection). °· During a dental or medical procedure. °· After you brush your teeth so hard that your gums bleed. °· When a bacterial infection in another part of the body spreads to the blood. °· Through a dirty needle. ° °What increases the risk? °This condition is more likely to develop in: °· Children. °· Elderly adults. °· People who have a long-lasting (chronic) disease or medical condition. °· People who have an artificial joint or heart valve. °· People who have heart valve disease. °· People who have a tube, such as a catheter or IV tube, that has been inserted for a medical treatment. °· People who have a weak body defense system (immune system). °· People who use IV drugs. ° °What are the signs or symptoms? °Symptoms of this condition include: °· Fever. °· Chills. °· A racing heart. °· Shortness of breath. °· Dizziness. °· Weakness. °· Confusion. °· Nausea or vomiting. °· Diarrhea. ° °In some cases, there are no symptoms. Bacteremia that has spread to the other parts of the body may cause symptoms in those areas. °How is this diagnosed? °This condition may be diagnosed with a physical exam and tests, such as: °· A complete blood count (CBC). This test looks for signs of infection. °· Blood cultures. These look for bacteria in your blood. °· Tests of any tubes that you may have inserted into  your body, such as an IV tube or urinary catheter. These tests look for a source of infection. °· Urine tests including urine cultures. These look for bacteria in the urine that could be a source of infection. °· Imaging tests, such as an X-ray, CT scan, MRI, or heart ultrasound. These look for a source of infection in other parts of the body, such as the lungs, heart valves, or joints. ° °How is this treated? °This condition may be treated with: °· Antibiotic medicines given through an IV infusion. Depending on the source of infection, antibiotics may be needed for several weeks. At first, an antibiotic may be given to kill most types of blood bacteria. If your test results show that a certain kind of bacteria is causing problems, the antibiotic may be changed to kill only the bacteria that are causing problems. °· Antibiotics taken by mouth. °· IV fluids to support the body as you fight the infection. °· Removing any catheter or device that could be a source of infection. °· Blood pressure and breathing support, if you have sepsis. °· Surgery to control the source or spread of infection, such as: °? Removing an infected implanted device. °? Removing infected tissue or an abscess. ° °This condition is usually treated at a hospital. If you are treated at home, you may need to come back for medicines, blood tests, and evaluation. This is important. °Follow these instructions at home: °· Take over-the-counter and prescription medicines only as told by your health care provider. °·   If you were prescribed an antibiotic, take it as told by your health care provider. Do not stop taking the antibiotic even if you start to feel better. °· Rest until your condition is under control. °· Drink enough fluid to keep your urine clear or pale yellow. °· Do not smoke. If you need help quitting, ask your health care provider. °· Keep all follow-up visits as told by your health care provider. This is important. °How is this  prevented? °· Get the vaccinations that your health care provider recommends. °· Clean and cover any scrapes or cuts. °· Take good care of your skin. This includes regular bathing and moisturizing. °· Wash your hands often. °· Practice good oral hygiene. Brush your teeth two times a day and floss regularly. °Get help right away if: °· You have pain. °· You have a fever. °· You have trouble breathing. °· Your skin becomes blotchy, pale, or clammy. °· You develop confusion, dizziness, or weakness. °· You develop diarrhea. °· You develop any new symptoms after treatment. °Summary °· Bacteremia is the presence of bacteria in the blood. When bacteria enter the bloodstream, they can cause a life- threatening reaction called sepsis. °· Children and elderly adults are at increased risk of bacteremia. Other risk factors include having a long-lasting (chronic) disease or a weak immune system, having an artificial joint or heart valve, having heart valve disease, having tubes that were inserted in the body for medical treatment, or using IV drugs. °· Some symptoms of bacteremia include fever, chills, shortness of breath, confusion, nausea or vomiting, and diarrhea. °· Tests may be done to diagnose a source of infection that led to bacteremia. These tests may include blood tests, urine tests, and imaging tests. °· Bacteremia is usually treated with antibiotics, usually in a hospital. °This information is not intended to replace advice given to you by your health care provider. Make sure you discuss any questions you have with your health care provider. °Document Released: 12/31/2005 Document Revised: 02/14/2016 Document Reviewed: 02/14/2016 °Elsevier Interactive Patient Education © 2018 [employer] Inc[employer] ° °

## 2018-01-14 NOTE — Clinical Social Work Note (Signed)
Clinical Social Work Assessment  Patient Details  Name: Melanie [name] MRN: [id number] Date of Birth: [birth year]  Date of referral:  01/14/18               Reason for consult:  Facility Placement                Permission sought to share information with:    Permission granted to share information::     Name::        Agency::     Relationship::     Contact Information:     Housing/Transportation Living arrangements for the past 2 months:  Mobile Home Source of Information:  Patient Patient Interpreter Needed:  None Criminal Activity/Legal Involvement Pertinent to Current Situation/Hospitalization:  No - Comment as needed Significant Relationships:  Adult Children Lives with:  Adult Children Do you feel safe going back to the place where you live?  Yes Need for family participation in patient care:  Yes (Comment)  Care giving concerns:  None identified. Patient reports being independent at baseline.   Social Worker assessment / plan:  Patient's adult son lives with her. Patient states that she does not want to go to SNF for her IV antibiotics. She states that her son is "Soil scientist. He knows about all that stuff" in regards to administering the IV antibiotics. Patient then stated "why is everyone trying to make me go to a nursing home?" LCSW explained that SNF placement would be short term for IV antibiotics only. Patient again declined SNF.  Patient stated that she and her son will do the antibiotics. LCSW signing off.   Employment status:  Disabled (Comment on whether or not currently receiving Disability) Insurance information:  [place]icaid [place] PT Recommendations:  Not assessed at this time Information / Referral to community resources:     Patient/Family's Response to care:  Patient is not agreeable to SNF.   Patient/Family's Understanding of and Emotional Response to Diagnosis, Current Treatment, and Prognosis:  Patient understands her diagnosis, treatment and prognosis.    Emotional Assessment Appearance:  Appears stated age Attitude/Demeanor/Rapport:    Affect (typically observed):  Accepting Orientation:  Oriented to Self, Oriented to  Time, Oriented to Place, Oriented to Situation Alcohol / Substance use:  Not Applicable Psych involvement (Current and /or in the community):  No (Comment)  Discharge Needs  Concerns to be addressed:  Discharge Planning Concerns(Patient needs IV antibiotics through 10/28) Readmission within the last 30 days:  Yes Current discharge risk:  None Barriers to Discharge:  No Barriers Identified   Annice Needy, LCSW 01/14/2018, 9:07 AM

## 2018-01-14 NOTE — Care Management Note (Addendum)
Case Management Note  Patient Details  Name: Melanie [name] MRN: [id number] Date of Birth: Mar 06, 1966  Expected Discharge Date:      01/14/18            Expected Discharge Plan:  Home w Home Health Services  In-House Referral:  Clinical Social Work  Discharge planning Services  CM Consult  Post Acute Care Choice:  Home Health Choice offered to:  Patient  HH Arranged:  RN, PT, IV Antibiotics HH Agency:  [facility]  Status of Service:  Completed, signed off  If discussed at Long Length of Stay Meetings, dates discussed:    Additional Comments: Pt now refusing SNF. Wants to go home with [facility] for IV abx and PT. Has no preference of HH providers, okay with [facility]. Pt's son and father will be able to help with IV abx. Bonita Quin, [facility] rep, given referral. First does IV abx will be given this afternoon in the home. Walker recommended by PT, pt has one at home pta.   Malcolm Metro, RN 01/14/2018, 10:32 AM

## 2018-01-14 NOTE — NC FL2 (Signed)
[place] MEDICAID FL2 LEVEL OF CARE SCREENING TOOL     IDENTIFICATION  Patient Name: Melanie [name] Birthdate: Jan 13, 1966 Sex: female Admission Date (Current Location): 01/06/2018  [facility] and [place] Number:  [employer] and Address:  [facility],  [address] [place] [address]      Provider Number: [id number]  Attending Physician Name and Address:  Cleora Fleet, MD  Relative Name and Phone Number:       Current Level of Care: Hospital Recommended Level of Care: Skilled Nursing Facility Prior Approval Number:    Date Approved/Denied:   PASRR Number:    Discharge Plan: SNF    Current Diagnoses: Patient Active Problem List   Diagnosis Date Noted  . Bacteremia due to Staphylococcus aureus 01/09/2018  . CAP (community acquired pneumonia) 01/06/2018  . HCAP (healthcare-associated pneumonia) 01/06/2018  . GERD (gastroesophageal reflux disease) 01/06/2018  . Tobacco use 01/06/2018  . Abnormal EKG 01/06/2018  . Tachycardia 01/06/2018  . Multifocal pneumonia 01/03/2018  . Asthma exacerbation 01/03/2018  . Falls 01/03/2018  . Anemia 01/03/2018  . Hypokalemia 01/03/2018  . AKI (acute kidney injury) (HCC) 01/03/2018  . Hypertension 01/03/2018  . Lymphopenia 01/03/2018  . Pneumococcal pneumonia (HCC) 11/29/2011  . Thrombocytopenia (HCC) 11/29/2011  . Acute respiratory failure (HCC) 11/24/2011  . Septic shock(785.52) 11/24/2011  . BIPOLAR DISORDER UNSPECIFIED 04/08/2009  . 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 PNEUMONIA 04/08/2009  . SEIZURE DISORDER 04/08/2009  . SUBSTANCE ABUSE, MULTIPLE 09/22/2008    Orientation RESPIRATION BLADDER Height & Weight     Self, Time, Situation, Place  Normal Continent Weight: 155 lb 13.8 oz (70.7 kg) Height:  4\' 11"  (149.9 cm)  BEHAVIORAL SYMPTOMS/MOOD NEUROLOGICAL BOWEL NUTRITION STATUS      Continent Diet(heart healthy/carb modified )  AMBULATORY STATUS COMMUNICATION OF NEEDS Skin   Supervision  Verbally Normal                       Personal Care Assistance Level of Assistance  Bathing, Feeding, Dressing Bathing Assistance: Independent Feeding assistance: Independent Dressing Assistance: Independent     Functional Limitations Info  Sight, Hearing, Speech Sight Info: Adequate Hearing Info: Adequate Speech Info: Adequate    SPECIAL CARE FACTORS FREQUENCY                       Contractures Contractures Info: Not present    Additional Factors Info  Code Status, Allergies Code Status Info: Full Code Allergies Info: NKA           Current Medications (01/14/2018):  This is the current hospital active medication list Current Facility-Administered Medications  Medication Dose Route Frequency Provider Last Rate Last Dose  . acetaminophen (TYLENOL) tablet 650 mg  650 mg Oral Q6H PRN Bobette Mo, MD       Or  . acetaminophen (TYLENOL) suppository 650 mg  650 mg Rectal Q6H PRN Bobette Mo, MD      . alum & mag hydroxide-simeth (MAALOX/MYLANTA) 200-200-20 MG/5ML suspension 30 mL  30 mL Oral Q4H PRN Erick Blinks, MD   30 mL at 01/08/18 0157  . budesonide (PULMICORT) nebulizer solution 0.25 mg  0.25 mg Nebulization BID Erick Blinks, MD   0.25 mg at 01/14/18 0720  . ceFAZolin (ANCEF) IVPB 2g/100 mL premix  2 g Intravenous Q8H Erick Blinks, MD 200 mL/hr at 01/14/18 0823 2 g at 01/14/18 0823  . cyclobenzaprine (FLEXERIL) tablet 5 mg  5 mg Oral TID  PRN Johnson, Clanford L, MD      . diphenhydrAMINE (BENADRYL) capsule 25 mg  25 mg Oral Q6H PRN Erick Blinks, MD   25 mg at 01/11/18 2109  . enoxaparin (LOVENOX) injection 40 mg  40 mg Subcutaneous Q24H Bobette Mo, MD   40 mg at 01/13/18 2118  . guaiFENesin (MUCINEX) 12 hr tablet 1,200 mg  1,200 mg Oral BID Erick Blinks, MD   1,200 mg at 01/14/18 0815  . insulin aspart (novoLOG) injection 0-9 Units  0-9 Units Subcutaneous TID WC Erick Blinks, MD   2 Units at 01/13/18 1736  .  ipratropium (ATROVENT) nebulizer solution 0.5 mg  0.5 mg Nebulization Q6H Bobette Mo, MD   0.5 mg at 01/14/18 0717  . levalbuterol (XOPENEX) nebulizer solution 1.25 mg  1.25 mg Nebulization Q6H PRN Bobette Mo, MD   1.25 mg at 01/06/18 2142  . levalbuterol (XOPENEX) nebulizer solution 1.25 mg  1.25 mg Nebulization Q6H Bobette Mo, MD   1.25 mg at 01/14/18 0716  . LORazepam (ATIVAN) tablet 0.5 mg  0.5 mg Oral Q6H PRN Laural Benes, Clanford L, MD      . MEDLINE mouth rinse  15 mL Mouth Rinse BID Bobette Mo, MD   15 mL at 01/13/18 1096  . methylPREDNISolone sodium succinate (SOLU-MEDROL) 40 mg/mL injection 40 mg  40 mg Intravenous Q24H Johnson, Clanford L, MD   40 mg at 01/14/18 0816  . metoprolol tartrate (LOPRESSOR) tablet 25 mg  25 mg Oral BID Erick Blinks, MD   25 mg at 01/14/18 0815  . nicotine (NICODERM CQ - dosed in mg/24 hours) patch 14 mg  14 mg Transdermal Daily Elson Areas, PA-C   14 mg at 01/14/18 0454  . ondansetron (ZOFRAN) tablet 4 mg  4 mg Oral Q6H PRN Bobette Mo, MD   4 mg at 01/12/18 1706   Or  . ondansetron [facility]) injection 4 mg  4 mg Intravenous Q6H PRN Bobette Mo, MD   4 mg at 01/14/18 0405  . oxyCODONE-acetaminophen (PERCOCET/ROXICET) 5-325 MG per tablet 1 tablet  1 tablet Oral Q3H PRN Cleora Fleet, MD   1 tablet at 01/14/18 0815  . pantoprazole (PROTONIX) EC tablet 40 mg  40 mg Oral Daily Bobette Mo, MD   40 mg at 01/14/18 0816  . polyethylene glycol (MIRALAX / GLYCOLAX) packet 17 g  17 g Oral BID Johnson, Clanford L, MD   17 g at 01/14/18 0816  . traZODone (DESYREL) tablet 50 mg  50 mg Oral QHS PRN Laural Benes, Clanford L, MD   50 mg at 01/13/18 2119     Discharge Medications: Please see discharge summary for a list of discharge medications.  Relevant Imaging Results:  Relevant Lab Results:   Additional Information SSN [id number]. Patient will need IV antibiotics: ancef 2gm IV q8h x 14 days. End date  01/27/18.  Preston Weill, Juleen China, LCSW

## 2018-01-14 NOTE — Progress Notes (Signed)
Patient states understanding of discharge instructions.  

## 2018-01-16 ENCOUNTER — Encounter (HOSPITAL_COMMUNITY): Payer: Self-pay | Admitting: Cardiovascular Disease

## 2018-01-24 ENCOUNTER — Emergency Department (HOSPITAL_COMMUNITY): Payer: Medicaid Other

## 2018-01-24 ENCOUNTER — Inpatient Hospital Stay (HOSPITAL_COMMUNITY)
Admission: EM | Admit: 2018-01-24 | Discharge: 2018-01-29 | DRG: 683 | Disposition: A | Discharge status: Home Health | Payer: Medicaid Other | Attending: Internal Medicine | Admitting: Internal Medicine

## 2018-01-24 ENCOUNTER — Encounter (HOSPITAL_COMMUNITY): Payer: Self-pay

## 2018-01-24 ENCOUNTER — Inpatient Hospital Stay (HOSPITAL_COMMUNITY): Payer: Medicaid Other

## 2018-01-24 DIAGNOSIS — K2970 Gastritis, unspecified, without bleeding: Secondary | ICD-10-CM

## 2018-01-24 DIAGNOSIS — D62 Acute posthemorrhagic anemia: Secondary | ICD-10-CM | POA: Diagnosis present

## 2018-01-24 DIAGNOSIS — R569 Unspecified convulsions: Secondary | ICD-10-CM | POA: Diagnosis not present

## 2018-01-24 DIAGNOSIS — E872 Acidosis, unspecified: Secondary | ICD-10-CM | POA: Diagnosis present

## 2018-01-24 DIAGNOSIS — R109 Unspecified abdominal pain: Secondary | ICD-10-CM | POA: Diagnosis not present

## 2018-01-24 DIAGNOSIS — M545 Low back pain, unspecified: Secondary | ICD-10-CM

## 2018-01-24 DIAGNOSIS — Z79891 Long term (current) use of opiate analgesic: Secondary | ICD-10-CM | POA: Diagnosis not present

## 2018-01-24 DIAGNOSIS — K219 Gastro-esophageal reflux disease without esophagitis: Secondary | ICD-10-CM | POA: Diagnosis present

## 2018-01-24 DIAGNOSIS — Z79899 Other long term (current) drug therapy: Secondary | ICD-10-CM

## 2018-01-24 DIAGNOSIS — G40909 Epilepsy, unspecified, not intractable, without status epilepticus: Secondary | ICD-10-CM | POA: Diagnosis present

## 2018-01-24 DIAGNOSIS — E860 Dehydration: Secondary | ICD-10-CM | POA: Diagnosis present

## 2018-01-24 DIAGNOSIS — I10 Essential (primary) hypertension: Secondary | ICD-10-CM | POA: Diagnosis present

## 2018-01-24 DIAGNOSIS — F319 Bipolar disorder, unspecified: Secondary | ICD-10-CM | POA: Diagnosis present

## 2018-01-24 DIAGNOSIS — K2980 Duodenitis without bleeding: Secondary | ICD-10-CM | POA: Diagnosis not present

## 2018-01-24 DIAGNOSIS — F17210 Nicotine dependence, cigarettes, uncomplicated: Secondary | ICD-10-CM | POA: Diagnosis present

## 2018-01-24 DIAGNOSIS — J45909 Unspecified asthma, uncomplicated: Secondary | ICD-10-CM | POA: Diagnosis present

## 2018-01-24 DIAGNOSIS — J15211 Pneumonia due to Methicillin susceptible Staphylococcus aureus: Secondary | ICD-10-CM

## 2018-01-24 DIAGNOSIS — G4710 Hypersomnia, unspecified: Secondary | ICD-10-CM | POA: Diagnosis present

## 2018-01-24 DIAGNOSIS — R195 Other fecal abnormalities: Secondary | ICD-10-CM | POA: Diagnosis not present

## 2018-01-24 DIAGNOSIS — K2990 Gastroduodenitis, unspecified, without bleeding: Secondary | ICD-10-CM | POA: Diagnosis present

## 2018-01-24 DIAGNOSIS — N179 Acute kidney failure, unspecified: Secondary | ICD-10-CM | POA: Diagnosis present

## 2018-01-24 DIAGNOSIS — Z7951 Long term (current) use of inhaled steroids: Secondary | ICD-10-CM

## 2018-01-24 DIAGNOSIS — R10A1 Flank pain, right side: Secondary | ICD-10-CM | POA: Diagnosis present

## 2018-01-24 DIAGNOSIS — D649 Anemia, unspecified: Secondary | ICD-10-CM | POA: Diagnosis not present

## 2018-01-24 DIAGNOSIS — K3189 Other diseases of stomach and duodenum: Secondary | ICD-10-CM | POA: Diagnosis not present

## 2018-01-24 HISTORY — DX: Pneumonia, unspecified organism: J18.9

## 2018-01-24 LAB — I-STAT CG4 LACTIC ACID, ED
Lactic Acid, Venous: 2.69 mmol/L (ref 0.5–1.9)
Lactic Acid, Venous: 1.99 mmol/L — ABNORMAL HIGH (ref 0.5–1.9)

## 2018-01-24 LAB — LIPASE, BLOOD: Lipase: 71 U/L — ABNORMAL HIGH (ref 11–51)

## 2018-01-24 LAB — I-STAT CHEM 8, ED
BUN: 21 mg/dL — AB (ref 6–20)
CALCIUM ION: 1.2 mmol/L (ref 1.15–1.40)
CHLORIDE: 103 mmol/L (ref 98–111)
Creatinine, Ser: 1.4 mg/dL — ABNORMAL HIGH (ref 0.44–1.00)
Glucose, Bld: 160 mg/dL — ABNORMAL HIGH (ref 70–99)
HCT: 35 % — ABNORMAL LOW (ref 36.0–46.0)
Hemoglobin: 11.9 g/dL — ABNORMAL LOW (ref 12.0–15.0)
Potassium: 4.7 mmol/L (ref 3.5–5.1)
SODIUM: 136 mmol/L (ref 135–145)
TCO2: 24 mmol/L (ref 22–32)

## 2018-01-24 LAB — CBC WITH DIFFERENTIAL/PLATELET
ABS IMMATURE GRANULOCYTES: 0.07 10*3/uL (ref 0.00–0.07)
Basophils Absolute: 0 10*3/uL (ref 0.0–0.1)
Basophils Relative: 0 %
EOS PCT: 1 %
Eosinophils Absolute: 0.1 10*3/uL (ref 0.0–0.5)
HEMATOCRIT: 37.9 % (ref 36.0–46.0)
HEMOGLOBIN: 11.6 g/dL — AB (ref 12.0–15.0)
Immature Granulocytes: 1 %
LYMPHS ABS: 1.5 10*3/uL (ref 0.7–4.0)
LYMPHS PCT: 13 %
MCH: 28 pg (ref 26.0–34.0)
MCHC: 30.6 g/dL (ref 30.0–36.0)
MCV: 91.3 fL (ref 80.0–100.0)
MONO ABS: 0.4 10*3/uL (ref 0.1–1.0)
Monocytes Relative: 4 %
NEUTROS ABS: 9.2 10*3/uL — AB (ref 1.7–7.7)
Neutrophils Relative %: 81 %
Platelets: 254 10*3/uL (ref 150–400)
RBC: 4.15 MIL/uL (ref 3.87–5.11)
RDW: 13.2 % (ref 11.5–15.5)
WBC: 11.3 10*3/uL — AB (ref 4.0–10.5)
nRBC: 0 % (ref 0.0–0.2)

## 2018-01-24 LAB — URINALYSIS, ROUTINE W REFLEX MICROSCOPIC
BILIRUBIN URINE: NEGATIVE
GLUCOSE, UA: NEGATIVE mg/dL
KETONES UR: NEGATIVE mg/dL
LEUKOCYTES UA: NEGATIVE
NITRITE: NEGATIVE
Protein, ur: NEGATIVE mg/dL
Specific Gravity, Urine: 1.008 (ref 1.005–1.030)
pH: 6 (ref 5.0–8.0)

## 2018-01-24 LAB — I-STAT TROPONIN, ED: Troponin i, poc: 0 ng/mL (ref 0.00–0.08)

## 2018-01-24 LAB — COMPREHENSIVE METABOLIC PANEL
ALK PHOS: 129 U/L — AB (ref 38–126)
ALT: 32 U/L (ref 0–44)
AST: 41 U/L (ref 15–41)
Albumin: 2.9 g/dL — ABNORMAL LOW (ref 3.5–5.0)
Anion gap: 11 (ref 5–15)
BUN: 21 mg/dL — ABNORMAL HIGH (ref 6–20)
CALCIUM: 9.1 mg/dL (ref 8.9–10.3)
CHLORIDE: 101 mmol/L (ref 98–111)
CO2: 26 mmol/L (ref 22–32)
CREATININE: 1.41 mg/dL — AB (ref 0.44–1.00)
GFR calc non Af Amer: 41 mL/min — ABNORMAL LOW (ref >60)
GFR, EST AFRICAN AMERICAN: 48 mL/min — AB (ref >60)
Glucose, Bld: 164 mg/dL — ABNORMAL HIGH (ref 70–99)
Potassium: 4.7 mmol/L (ref 3.5–5.1)
SODIUM: 138 mmol/L (ref 135–145)
Total Bilirubin: 0.9 mg/dL (ref 0.3–1.2)
Total Protein: 7.1 g/dL (ref 6.5–8.1)

## 2018-01-24 LAB — RAPID URINE DRUG SCREEN, HOSP PERFORMED
Amphetamines: NOT DETECTED
BARBITURATES: NOT DETECTED
BENZODIAZEPINES: NOT DETECTED
Cocaine: NOT DETECTED
Opiates: NOT DETECTED
TETRAHYDROCANNABINOL: NOT DETECTED

## 2018-01-24 LAB — MRSA PCR SCREENING: MRSA BY PCR: NEGATIVE

## 2018-01-24 LAB — CBG MONITORING, ED: GLUCOSE-CAPILLARY: 160 mg/dL — AB (ref 70–99)

## 2018-01-24 MED ORDER — PANTOPRAZOLE SODIUM 40 MG PO TBEC
40.0000 mg | DELAYED_RELEASE_TABLET | Freq: Every day | ORAL | Status: DC
  Administered 2018-01-24 – 2018-01-27 (×4): 40 mg via ORAL
  Filled 2018-01-24 (×4): qty 1

## 2018-01-24 MED ORDER — PROMETHAZINE HCL 12.5 MG PO TABS
12.5000 mg | ORAL_TABLET | Freq: Four times a day (QID) | ORAL | Status: DC | PRN
  Administered 2018-01-27 – 2018-01-29 (×3): 12.5 mg via ORAL
  Filled 2018-01-24 (×3): qty 1

## 2018-01-24 MED ORDER — CEFAZOLIN SODIUM-DEXTROSE 2-4 GM/100ML-% IV SOLN
INTRAVENOUS | Status: AC
  Filled 2018-01-24: qty 200

## 2018-01-24 MED ORDER — METOPROLOL TARTRATE 25 MG PO TABS
25.0000 mg | ORAL_TABLET | Freq: Two times a day (BID) | ORAL | Status: DC
  Administered 2018-01-24 – 2018-01-25 (×3): 25 mg via ORAL
  Filled 2018-01-24 (×3): qty 1

## 2018-01-24 MED ORDER — CEFAZOLIN SODIUM-DEXTROSE 2-4 GM/100ML-% IV SOLN
2.0000 g | Freq: Three times a day (TID) | INTRAVENOUS | Status: DC
  Administered 2018-01-24 – 2018-01-25 (×2): 2 g via INTRAVENOUS
  Filled 2018-01-24 (×9): qty 100

## 2018-01-24 MED ORDER — CYCLOBENZAPRINE HCL 10 MG PO TABS
5.0000 mg | ORAL_TABLET | Freq: Three times a day (TID) | ORAL | Status: DC | PRN
  Administered 2018-01-25 – 2018-01-27 (×3): 5 mg via ORAL
  Filled 2018-01-24 (×3): qty 1

## 2018-01-24 MED ORDER — IOPAMIDOL (ISOVUE-300) INJECTION 61%
60.0000 mL | Freq: Once | INTRAVENOUS | Status: AC | PRN
  Administered 2018-01-24: 60 mL via INTRAVENOUS

## 2018-01-24 MED ORDER — CEFAZOLIN SODIUM-DEXTROSE 2-4 GM/100ML-% IV SOLN
2.0000 g | Freq: Once | INTRAVENOUS | Status: AC
  Administered 2018-01-24: 2 g via INTRAVENOUS
  Filled 2018-01-24: qty 100

## 2018-01-24 MED ORDER — SODIUM CHLORIDE 0.9 % IV SOLN
INTRAVENOUS | Status: DC
  Administered 2018-01-25: via INTRAVENOUS

## 2018-01-24 MED ORDER — ENOXAPARIN SODIUM 40 MG/0.4ML ~~LOC~~ SOLN
40.0000 mg | SUBCUTANEOUS | Status: DC
  Administered 2018-01-24 – 2018-01-25 (×2): 40 mg via SUBCUTANEOUS
  Filled 2018-01-24 (×2): qty 0.4

## 2018-01-24 MED ORDER — DEXTROSE 5 % IV SOLN: 2.0000 g | Freq: Once | INTRAVENOUS | Status: DC

## 2018-01-24 MED ORDER — SODIUM CHLORIDE 0.9 % IV SOLN
INTRAVENOUS | Status: DC
  Administered 2018-01-24: 12:00:00 via INTRAVENOUS

## 2018-01-24 MED ORDER — FENTANYL CITRATE (PF) 100 MCG/2ML IJ SOLN
50.0000 ug | Freq: Once | INTRAMUSCULAR | Status: AC
  Administered 2018-01-24: 50 ug via INTRAVENOUS
  Filled 2018-01-24: qty 2

## 2018-01-24 MED ORDER — ACETAMINOPHEN 325 MG PO TABS: 650.0000 mg | ORAL_TABLET | Freq: Four times a day (QID) | ORAL | Status: DC | PRN

## 2018-01-24 MED ORDER — ACETAMINOPHEN 650 MG RE SUPP: 650.0000 mg | Freq: Four times a day (QID) | RECTAL | Status: DC | PRN

## 2018-01-24 MED ORDER — OXYCODONE-ACETAMINOPHEN 5-325 MG PO TABS
1.0000 | ORAL_TABLET | Freq: Three times a day (TID) | ORAL | Status: DC | PRN
  Administered 2018-01-24 – 2018-01-25 (×2): 1 via ORAL
  Filled 2018-01-24 (×2): qty 1

## 2018-01-24 MED ORDER — SODIUM CHLORIDE 0.9 % IV SOLN: INTRAVENOUS | Status: DC

## 2018-01-24 MED ORDER — POLYETHYLENE GLYCOL 3350 17 G PO PACK: 17.0000 g | PACK | Freq: Every day | ORAL | Status: DC | PRN

## 2018-01-24 MED ORDER — ALBUTEROL SULFATE (2.5 MG/3ML) 0.083% IN NEBU: 3.0000 mL | INHALATION_SOLUTION | RESPIRATORY_TRACT | Status: DC | PRN

## 2018-01-24 MED ORDER — ONDANSETRON HCL 4 MG/2ML IJ SOLN
4.0000 mg | Freq: Once | INTRAMUSCULAR | Status: AC
  Administered 2018-01-24: 4 mg via INTRAVENOUS
  Filled 2018-01-24: qty 2

## 2018-01-24 MED ORDER — CEFAZOLIN SODIUM-DEXTROSE 1-4 GM/50ML-% IV SOLN
2.0000 g | Freq: Once | INTRAVENOUS | Status: DC
  Filled 2018-01-24: qty 100

## 2018-01-24 NOTE — ED Notes (Signed)
Pharmacy to bring correct dose of Ancef at this time.

## 2018-01-24 NOTE — ED Notes (Signed)
[place], [place] father [phone]

## 2018-01-24 NOTE — ED Notes (Signed)
Patient asleep, snoring upon entrance into room. Woke pt up to do assessment and she starting yelling very loudly. Pt refused to answer any questions, also refused to let me assess her. I tried to reposition patient to the middle of the bed, patient refused. Pt laid against the side rail, clenched the side rail when I tried to help her move to center of bed. I put pt's oxygen sensor back on her finger and she pulled it off. Pt yells "My God" over and over. I alerted Zackowski about pt's behavior and he stated he did not want me to give meds due to patient's past history of overdoses, until he examined pt to avoid overdose.

## 2018-01-24 NOTE — ED Triage Notes (Signed)
Pt brought in by EMS . Pt was sleeping and snoring until awakened to transfer to stretcher. EMS called due to hip pain, leg pain, back pain. Pt was smoking when EMS arrived then slept throughout ride. Pt has picc line in right arm and has been treated for pneumonia

## 2018-01-24 NOTE — ED Notes (Signed)
Pt will not answer any questions on shouting OH GOD

## 2018-01-24 NOTE — ED Notes (Signed)
Pt screaming out while vital signs are being assessed

## 2018-01-24 NOTE — ED Notes (Addendum)
Pt going to CT

## 2018-01-24 NOTE — ED Notes (Signed)
Pt found at the edge of the bed, had ripped off all monitoring cords, and was naked. Redirected pt into bed, applied monitors, and gown. Pt calmed down and could answer questions. Moving all extremities appropriately.

## 2018-01-24 NOTE — H&P (Signed)
History and Physical  Melanie [name] RUE:[id number] DOB: 11-29-1965 DOA: 01/24/2018  Referring physician: Dr Deretha Emory, ED physician PCP: Tanna Furry, MD  Outpatient Specialists:   Patient Coming From: home  Chief Complaint: pain, hypersomnolence  HPI: Melanie [name] is a 52 y.o. 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  Obtained by medical records as patient unable to provide much of the history.  Newly hospitalized from October 7 until October 15 following a MSSA related sepsis and pneumonia.  Patient has PICC line for IV antibiotics at home.  The patient has been getting Ancef 3 times a day for antibiotics.  Yesterday, the patient was fairly normal and without much back pain.  Today, the patient has had increased pain and the patient was hysterically crying in pain.  Patient was brought to the emergency department for evaluation.  Patient has become fairly comfortable and falls asleep with fentanyl, but begins to moan and writhe with in pain awoken.  Emergency Department Course: CT scan of the abdomen negative for kidney stone.  Creatinine 1.4, which is elevated from baseline of 0.6.  BUN elevated at 21.  Lactic acid initially 2.7 and now 2.  White count 11.6.  Review of Systems:   Pt denies any fevers, chills, nausea, vomiting, diarrhea, constipation, abdominal pain, shortness of breath, dyspnea on exertion, orthopnea, cough, wheezing, palpitations, headache, vision changes, lightheadedness, dizziness, melena, rectal bleeding.  Review of systems are otherwise negative  Past Medical History:  Diagnosis Date  . Asthma   . GERD (gastroesophageal reflux disease)   . Head injury, acute    History of subdural hematoma following salicylate poisoning.  Marland Kitchen Hypertension   . Pneumonia   . Risk for falls   . Short-term memory loss   . SUBDURAL HEMATOMA  04/08/2009   Qualifier: History of  By: Logan Bores MD, Casimiro Needle     Past Surgical History:  Procedure Laterality Date  . BRAIN SURGERY    . HERNIA REPAIR    . TEE WITHOUT CARDIOVERSION N/A 01/13/2018   Procedure: TRANSESOPHAGEAL ECHOCARDIOGRAM (TEE);  Surgeon: Laqueta Linden, MD;  Location: [facility];  Service: Cardiovascular;  Laterality: N/A;   Social History:  reports that she has been smoking cigarettes. She has a 35.00 pack-year smoking history. She has never used smokeless tobacco. She reports that she drinks alcohol. She reports that she does not use drugs. Patient lives at home  No Known Allergies  Family History  Problem Relation Age of Onset  . Hypertension Father   . Diabetes Father   . Hypertension Other   . Diabetes Other       Prior to Admission medications   Medication Sig Start Date End Date Taking? Authorizing Provider  oxyCODONE-acetaminophen (PERCOCET/ROXICET) 5-325 MG tablet Take 1 tablet by mouth every 8 (eight) hours as needed for severe pain.   Yes [provider]  albuterol (PROVENTIL HFA;VENTOLIN HFA) 108 (90 Base) MCG/ACT inhaler Inhale 2 puffs into the lungs every 4 (four) hours as needed for wheezing or shortness of breath (or coughing). 05/14/17   Dione Booze, MD  cyclobenzaprine (FLEXERIL) 5 MG tablet Take 1 tablet (5 mg total) by mouth 3 (three) times daily as needed for muscle spasms. 01/14/18   Johnson, Clanford L, MD  metoprolol tartrate (LOPRESSOR) 25 MG tablet Take 1 tablet (25 mg total) by mouth 2 (two) times daily. 01/14/18 02/13/18  Cleora Fleet, MD  pantoprazole (PROTONIX)  40 MG tablet Take 1 tablet (40 mg total) by mouth daily for 14 days. 01/15/18 01/29/18  Johnson, Clanford L, MD  polyethylene glycol (MIRALAX / GLYCOLAX) packet Take 17 g by mouth daily. 01/14/18 02/13/18  Cleora Fleet, MD    Physical Exam: BP 113/89   Pulse 88   Temp 97.8 F (36.6 C) (Rectal)   Resp 17   LMP 12/26/2011   SpO2 95%    . General: Mild aged woman who appears older than stated age. Awake and alert and oriented x3. No acute cardiopulmonary distress.  Marland Kitchen HEENT: Normocephalic atraumatic.  Right and left ears normal in appearance.  Pupils equal, round, reactive to light. Extraocular muscles are intact. Sclerae anicteric and noninjected.  Moist mucosal membranes. No mucosal lesions.  . Neck: Neck supple without lymphadenopathy. No carotid bruits. No masses palpated.  . Cardiovascular: Regular rate with normal S1-S2 sounds. No murmurs, rubs, gallops auscultated. No JVD.  Marland Kitchen Respiratory: Good respiratory. Mild rales and rhonchi throughout.  No accessory muscle use. . Abdomen: Soft, nontender, nondistended. Active bowel sounds. No masses or hepatosplenomegaly  . Skin: No rashes, lesions, or ulcerations.  Dry, warm to touch. 2+ dorsalis pedis and radial pulses. . Musculoskeletal: Right flank tenderness. No calf or leg pain. All major joints not erythematous nontender.  No upper or lower joint deformation.  Good ROM.  No contractures  . Psychiatric: Intact judgment and insight. Pleasant and cooperative. . Neurologic: No focal neurological deficits. Strength is 5/5 and symmetric in upper and lower extremities.  Cranial nerves II through XII are grossly intact.           Labs on Admission: I have personally reviewed following labs and imaging studies  CBC: Recent Labs  Lab 01/24/18 1235 01/24/18 1325  WBC 11.3*  --   NEUTROABS 9.2*  --   HGB 11.6* 11.9*  HCT 37.9 35.0*  MCV 91.3  --   PLT 254  --    Basic Metabolic Panel: Recent Labs  Lab 01/24/18 1235 01/24/18 1325  NA 138 136  K 4.7 4.7  CL 101 103  CO2 26  --   GLUCOSE 164* 160*  BUN 21* 21*  CREATININE 1.41* 1.40*  CALCIUM 9.1  --    GFR: CrCl cannot be calculated (Unknown ideal weight.). Liver Function Tests: Recent Labs  Lab 01/24/18 1235  AST 41  ALT 32  ALKPHOS 129*  BILITOT 0.9  PROT 7.1  ALBUMIN 2.9*   Recent Labs  Lab  01/24/18 1235  LIPASE 71*   No results for input(s): AMMONIA in the last 168 hours. Coagulation Profile: No results for input(s): INR, PROTIME in the last 168 hours. Cardiac Enzymes: No results for input(s): CKTOTAL, CKMB, CKMBINDEX, TROPONINI in the last 168 hours. BNP (last 3 results) No results for input(s): PROBNP in the last 8760 hours. HbA1C: No results for input(s): HGBA1C in the last 72 hours. CBG: Recent Labs  Lab 01/24/18 1218  GLUCAP 160*   Lipid Profile: No results for input(s): CHOL, HDL, LDLCALC, TRIG, CHOLHDL, LDLDIRECT in the last 72 hours. Thyroid Function Tests: No results for input(s): TSH, T4TOTAL, FREET4, T3FREE, THYROIDAB in the last 72 hours. Anemia Panel: No results for input(s): VITAMINB12, FOLATE, FERRITIN, TIBC, IRON, RETICCTPCT in the last 72 hours. Urine analysis:    Component Value Date/Time   COLORURINE YELLOW 01/24/2018 1252   APPEARANCEUR CLEAR 01/24/2018 1252   LABSPEC 1.008 01/24/2018 1252   PHURINE 6.0 01/24/2018 1252   GLUCOSEU NEGATIVE 01/24/2018 1252  HGBUR SMALL (A) 01/24/2018 1252   BILIRUBINUR NEGATIVE 01/24/2018 1252   KETONESUR NEGATIVE 01/24/2018 1252   PROTEINUR NEGATIVE 01/24/2018 1252   UROBILINOGEN 0.2 01/02/2012 1615   NITRITE NEGATIVE 01/24/2018 1252   LEUKOCYTESUR NEGATIVE 01/24/2018 1252   Sepsis Labs: @LABRCNTIP (procalcitonin:4,lacticidven:4) ) Recent Results (from the past 240 hour(s))  Culture, blood (Routine X 2) w Reflex to ID Panel     Status: None (Preliminary result)   Collection Time: 01/24/18 12:49 PM  Result Value Ref Range Status   Specimen Description BLOOD BLOOD RIGHT ARM PICC LINE  Final   Special Requests   Final    BOTTLES DRAWN AEROBIC AND ANAEROBIC Blood Culture adequate volume Performed at [facility], [address] [place], [place] [address]    Culture PENDING  Incomplete   Report Status PENDING  Incomplete  Culture, blood (Routine X 2) w Reflex to ID Panel     Status: None (Preliminary  result)   Collection Time: 01/24/18  1:19 PM  Result Value Ref Range Status   Specimen Description BLOOD LEFT HAND  Final   Special Requests   Final    BOTTLES DRAWN AEROBIC ONLY Blood Culture results may not be optimal due to an inadequate volume of blood received in culture bottles Performed at [facility], [address], [place], [place] [address]    Culture PENDING  Incomplete   Report Status PENDING  Incomplete     Radiological Exams on Admission: Dg Chest Port 1 View  Result Date: 01/24/2018 CLINICAL DATA:  Altered mental status. EXAM: PORTABLE CHEST 1 VIEW COMPARISON:  Radiograph of January 06, 2018. FINDINGS: The heart size and mediastinal contours are within normal limits. No pneumothorax or pleural effusion is noted. Left lung is clear. Mildly improved right upper lobe opacity is noted suggesting improving pneumonia. The visualized skeletal structures are unremarkable. IMPRESSION: Mildly improved right upper lobe pneumonia. Electronically Signed   By: Lupita Raider, M.D.   On: 01/24/2018 12:46   Ct Renal Stone Study  Result Date: 01/24/2018 CLINICAL DATA:  52 year old female EXAM: CT ABDOMEN AND PELVIS WITHOUT CONTRAST TECHNIQUE: Multidetector CT imaging of the abdomen and pelvis was performed following the standard protocol without IV contrast. COMPARISON:  Chest CT 04/17/2017, abdominal CT 08/03/2016 FINDINGS: Lower chest: Multifocal airspace opacities of the right lower lung, a few with internal cavitation. This has progressed from the comparison CT of 04/17/2017 Hepatobiliary: Unremarkable liver.  Unremarkable gallbladder Pancreas: Unremarkable Spleen: Punctate calcifications of the spleen suggesting prior granulomatous disease Adrenals/Urinary Tract: Bilateral adrenal glands unremarkable. Right kidney without hydronephrosis or nephrolithiasis. Left kidney with nonobstructive stone at the superior collecting system. No hydronephrosis or perinephric stranding. Unremarkable course  the bilateral ureters. Urinary bladder unremarkable with no radiopaque stones or inflammation. Stomach/Bowel: Unremarkable stomach. Unremarkable small bowel with no focal dilation. No transition point. No inflammatory changes. Normal appendix. Moderate stool burden. No focal wall thickening of the colon. Colonic diverticula without associated inflammation. Vascular/Lymphatic: Minimal calcifications of the aorta and iliac arteries. No lymphadenopathy. Reproductive: Unremarkable pelvic structures. Other: None Musculoskeletal: No acute displaced fracture. Degenerative changes of the hips. IMPRESSION: No acute CT finding of the abdomen/pelvis. Multiple focal nodular opacities of the visualized right middle lobe and right lower lobe, less so on the left. These have progressed from the comparison chest CT of 04/17/2017. Findings are favored to represent infection of the right lung, with the differential including atypical infection and/or septic emboli. Metastatic disease cannot be excluded and correlation with the patient's history of malignancy  is recommended. If further imaging is warranted, consider CT chest. Nonobstructive left-sided nephrolithiasis. Electronically Signed   By: Gilmer Mor D.O.   On: 01/24/2018 15:41    EKG: Independently reviewed.  Sinus tachycardia with artifact.  No acute ST elevation or depression  Assessment/Plan: Principal Problem:   AKI (acute kidney injury) (HCC) Active Problems:   BIPOLAR DISORDER UNSPECIFIED   Seizure (HCC)   Hypertension   MSSA (methicillin susceptible Staphylococcus aureus) pneumonia (HCC)   Acute right flank pain   Lactic acidosis    This patient was discussed with the ED physician, including pertinent vitals, physical exam findings, labs, and imaging.  We also discussed care given by the ED provider.  1. Acute kidney injury a. IV fluids b. Repeat creatinine in the morning 2. MSSA pneumonia a. CT of the abdomen included some images of the lungs  which is suggestive of worsening disease. b. Will obtain CT of the chest for improved evaluation c. Continue Ancef d. At this point, I do not feel an escalation of antibiotics is warranted given that the patient does not have increased symptoms or fever that is indicative of worsening disease e. Repeat blood cultures obtained by EDP 3. Lactic acidosis a. Improved after IV fluids 4. Hypertension a. Continue antihypertensives 5. Right flank pain a. Clear etiology of the patient's pain b. Patient does slightly elevated lipase, although the patient's pain does not correlate with pancreatitis. c. We will repeat lipase in the morning 6. Bipolar disorder 7. Seizure disorder  DVT prophylaxis: Lovenox Consultants: None Code Status: Code Family Communication: None Disposition Plan: Pending at this time   Noralee Chars [facility] Pager [phone]  If 7PM-7AM, please contact night-coverage [email or web address] Password [email or web address]

## 2018-01-24 NOTE — ED Notes (Addendum)
Patient's family entered pt's room and woke pt. Pt began to yell again and the family stated that she has never acted this way. Pt's son wanted to know why pt was not being medicated. I stated that the pt has been given Fentanyl and that she has been sleeping ever since. Pt's son stated "She gave birth to me with a C-Section and never took any pain medicine during it". Also stated "We are worried about her legs and right lung ammonia". I asked the patient's son if he meant "pneumonia" and he said yes. Patient's son told pt that if she didn't stop yelling he would get in the bed with her. Patient's son stated "You have my permission to sedate and tie her down if you need to". I told him that was not necessary at this time.

## 2018-01-24 NOTE — ED Provider Notes (Signed)
[facility] EMERGENCY DEPARTMENT Provider Note   CSN: [id number] Arrival date & time: 01/24/18  1012     History   Chief Complaint Chief Complaint  Patient presents with  . Back Pain    HPI Melanie [name] is a 52 y.o. female.  Patient brought in by EMS.  Patient arrived very sleepy and somnolent.  But then would wake up and would just be hysterical in pain but was not able to verbalize where pain was coming from.  But could talk some.  Patient was writhing around in the bed.  Moving all extremities.  Did appear dehydrated.  Patient would point to her right lower back and flank area as the source of the pain.  Patient was recently admitted on October 7 and discharged October 15 following a significant hospitalization for MRSA related sepsis and pneumonia.  Patient has a PICC line in her right upper arm patient is to be receiving IV antibiotics at home.  Discharge notes implied that they wanted her to go to a nursing facility but patient went home with family.  Eventually family members did arrive.  Patient's grandson has been taking care of her.  Does states she has been getting her Ancef 3 times a day.  Did not have any today.  She states that she seemed fine yesterday she always has a little bit of back pain.  But he did not feel as if she was getting any better from when she first got home from the hospital.  9 this morning she started screaming hysterically and pain.  Grandson relates that the patient has had kidney stones in the past.     Past Medical History:  Diagnosis Date  . Asthma   . GERD (gastroesophageal reflux disease)   . Head injury, acute    History of subdural hematoma following salicylate poisoning.  Marland Kitchen Hypertension   . Pneumonia   . Risk for falls   . Short-term memory loss   . SUBDURAL HEMATOMA 04/08/2009   Qualifier: History of  By: Logan Bores MD, Casimiro Needle      Patient Active Problem List   Diagnosis Date Noted  . Bacteremia due to Staphylococcus aureus  01/09/2018  . CAP (community acquired pneumonia) 01/06/2018  . HCAP (healthcare-associated pneumonia) 01/06/2018  . GERD (gastroesophageal reflux disease) 01/06/2018  . Tobacco use 01/06/2018  . Abnormal EKG 01/06/2018  . Tachycardia 01/06/2018  . Multifocal pneumonia 01/03/2018  . Asthma exacerbation 01/03/2018  . Falls 01/03/2018  . Anemia 01/03/2018  . Hypokalemia 01/03/2018  . AKI (acute kidney injury) (HCC) 01/03/2018  . Hypertension 01/03/2018  . Lymphopenia 01/03/2018  . Pneumococcal pneumonia (HCC) 11/29/2011  . Thrombocytopenia (HCC) 11/29/2011  . Acute respiratory failure (HCC) 11/24/2011  . Septic shock(785.52) 11/24/2011  . BIPOLAR DISORDER UNSPECIFIED 04/08/2009  . 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 PNEUMONIA 04/08/2009  . SEIZURE DISORDER 04/08/2009  . SUBSTANCE ABUSE, MULTIPLE 09/22/2008    Past Surgical History:  Procedure Laterality Date  . BRAIN SURGERY    . HERNIA REPAIR    . TEE WITHOUT CARDIOVERSION N/A 01/13/2018   Procedure: TRANSESOPHAGEAL ECHOCARDIOGRAM (TEE);  Surgeon: Laqueta Linden, MD;  Location: [facility];  Service: Cardiovascular;  Laterality: N/A;     OB History    Gravida  5   Para  2   Term  2   Preterm  0   AB  3   Living        SAB  3   TAB  0  Ectopic  0   Multiple      Live Births               Home Medications    Prior to Admission medications   Medication Sig Start Date End Date Taking? Authorizing Provider  oxyCODONE-acetaminophen (PERCOCET/ROXICET) 5-325 MG tablet Take 1 tablet by mouth every 8 (eight) hours as needed for severe pain.   Yes [provider]  albuterol (PROVENTIL HFA;VENTOLIN HFA) 108 (90 Base) MCG/ACT inhaler Inhale 2 puffs into the lungs every 4 (four) hours as needed for wheezing or shortness of breath (or coughing). 05/14/17   Dione Booze, MD  cyclobenzaprine (FLEXERIL) 5 MG tablet Take 1 tablet (5 mg total) by mouth 3 (three) times daily as needed for muscle spasms.  01/14/18   Johnson, Clanford L, MD  metoprolol tartrate (LOPRESSOR) 25 MG tablet Take 1 tablet (25 mg total) by mouth 2 (two) times daily. 01/14/18 02/13/18  Johnson, Clanford L, MD  pantoprazole (PROTONIX) 40 MG tablet Take 1 tablet (40 mg total) by mouth daily for 14 days. 01/15/18 01/29/18  Johnson, Clanford L, MD  polyethylene glycol (MIRALAX / GLYCOLAX) packet Take 17 g by mouth daily. 01/14/18 02/13/18  Cleora Fleet, MD    Family History Family History  Problem Relation Age of Onset  . Hypertension Father   . Diabetes Father   . Hypertension Other   . Diabetes Other     Social History Social History   Tobacco Use  . Smoking status: Current Every Day Smoker    Packs/day: 1.00    Years: 35.00    Pack years: 35.00    Types: Cigarettes  . Smokeless tobacco: Never Used  Substance Use Topics  . Alcohol use: Yes    Comment: occas  . Drug use: No    Comment: pt snorts px meds.     Allergies   Patient has no known allergies.   Review of Systems Review of Systems  Unable to perform ROS: Mental status change     Physical Exam Updated Vital Signs BP (!) 124/94 Comment: Simultaneous filing. User may not have seen previous data.  Pulse 91 Comment: Simultaneous filing. User may not have seen previous data.  Temp 97.8 F (36.6 C) (Rectal)   Resp 20 Comment: Simultaneous filing. User may not have seen previous data.  LMP 12/26/2011   SpO2 95% Comment: Simultaneous filing. User may not have seen previous data.  Physical Exam  Constitutional: She appears well-developed and well-nourished. She appears distressed.  HENT:  Head: Normocephalic and atraumatic.  Mucous membranes very dry.  Eyes: Pupils are equal, round, and reactive to light. Conjunctivae and EOM are normal.  Neck: Neck supple.  Cardiovascular: Normal rate, regular rhythm and normal heart sounds.  Pulmonary/Chest: Effort normal and breath sounds normal. No respiratory distress.  Abdominal: Soft.  Bowel sounds are normal. She exhibits no mass. There is no tenderness.  Slightly distended but not firm.  Soft.  Neurological: She is alert.  Not very verbal.  At times will just rest and then fall asleep at other times awake writhing screaming in pain.  Not very helpful from historical standpoint.  Did pinpoint pain to the right flank to right lower back area.  Patient was moving all extremities spontaneously.  Skin: Capillary refill takes 2 to 3 seconds. No rash noted. There is pallor.  Skin cool and mottled upper extremities and lower extremities.  Lower extremities feet bilaterally are pale but there is cap refill a little  delayed a little greater than 2 seconds.  Nursing note and vitals reviewed.    ED Treatments / Results  Labs (all labs ordered are listed, but only abnormal results are displayed) Labs Reviewed  COMPREHENSIVE METABOLIC PANEL - Abnormal; Notable for the following components:      Result Value   Glucose, Bld 164 (*)    BUN 21 (*)    Creatinine, Ser 1.41 (*)    Albumin 2.9 (*)    Alkaline Phosphatase 129 (*)    GFR calc non Af Amer 41 (*)    GFR calc Af Amer 48 (*)    All other components within normal limits  LIPASE, BLOOD - Abnormal; Notable for the following components:   Lipase 71 (*)    All other components within normal limits  CBC WITH DIFFERENTIAL/PLATELET - Abnormal; Notable for the following components:   WBC 11.3 (*)    Hemoglobin 11.6 (*)    Neutro Abs 9.2 (*)    All other components within normal limits  URINALYSIS, ROUTINE W REFLEX MICROSCOPIC - Abnormal; Notable for the following components:   Hgb urine dipstick SMALL (*)    Bacteria, UA RARE (*)    All other components within normal limits  I-STAT CHEM 8, ED - Abnormal; Notable for the following components:   BUN 21 (*)    Creatinine, Ser 1.40 (*)    Glucose, Bld 160 (*)    Hemoglobin 11.9 (*)    HCT 35.0 (*)    All other components within normal limits  I-STAT CG4 LACTIC ACID, ED -  Abnormal; Notable for the following components:   Lactic Acid, Venous 2.69 (*)    All other components within normal limits  CBG MONITORING, ED - Abnormal; Notable for the following components:   Glucose-Capillary 160 (*)    All other components within normal limits  I-STAT CG4 LACTIC ACID, ED - Abnormal; Notable for the following components:   Lactic Acid, Venous 1.99 (*)    All other components within normal limits  CULTURE, BLOOD (ROUTINE X 2)  CULTURE, BLOOD (ROUTINE X 2)  RAPID URINE DRUG SCREEN, HOSP PERFORMED  I-STAT TROPONIN, ED    EKG EKG Interpretation  Date/Time:  Friday January 24 2018 10:32:06 EDT Ventricular Rate:  125 PR Interval:    QRS Duration: 86 QT Interval:  326 QTC Calculation: 471 R Axis:   -10 Text Interpretation:  Sinus tachycardia Interpretation limited secondary to artifact Aberrant conduction of SV complex(es) Consider right atrial enlargement Abnormal R-wave progression, early transition Minimal ST depression, lateral leads Artifact in lead(s) I II aVR aVL Confirmed by Vanetta Mulders [phone]) on 01/24/2018 11:32:54 AM   Radiology Dg Chest Port 1 View  Result Date: 01/24/2018 CLINICAL DATA:  Altered mental status. EXAM: PORTABLE CHEST 1 VIEW COMPARISON:  Radiograph of January 06, 2018. FINDINGS: The heart size and mediastinal contours are within normal limits. No pneumothorax or pleural effusion is noted. Left lung is clear. Mildly improved right upper lobe opacity is noted suggesting improving pneumonia. The visualized skeletal structures are unremarkable. IMPRESSION: Mildly improved right upper lobe pneumonia. Electronically Signed   By: Lupita Raider, M.D.   On: 01/24/2018 12:46    Procedures Procedures (including critical care time)  CRITICAL CARE Performed by: Vanetta Mulders Total critical care time: 30 minutes Critical care time was exclusive of separately billable procedures and treating other patients. Critical care was necessary to  treat or prevent imminent or life-threatening deterioration. Critical care was time spent personally by  me on the following activities: development of treatment plan with patient and/or surrogate as well as nursing, discussions with consultants, evaluation of patient's response to treatment, examination of patient, obtaining history from patient or surrogate, ordering and performing treatments and interventions, ordering and review of laboratory studies, ordering and review of radiographic studies, pulse oximetry and re-evaluation of patient's condition.   Medications Ordered in ED Medications  0.9 %  sodium chloride infusion ( Intravenous New Bag/Given 01/24/18 1228)  fentaNYL (SUBLIMAZE) injection 50 mcg (50 mcg Intravenous Given 01/24/18 1226)  ondansetron (ZOFRAN) injection 4 mg (4 mg Intravenous Given 01/24/18 1226)  ceFAZolin (ANCEF) IVPB 2g/100 mL premix (0 g Intravenous Stopped 01/24/18 1441)  fentaNYL (SUBLIMAZE) injection 50 mcg (50 mcg Intravenous Given 01/24/18 1528)     Initial Impression / Assessment and Plan / ED Course  I have reviewed the triage vital signs and the nursing notes.  Pertinent labs & imaging results that were available during my care of the patient were reviewed by me and considered in my medical decision making (see chart for details).     Patient initially very somnolent when she arrived but then would go through bouts of severe pain writhing in bed.  Verbalize some but really would not follow commands very well.  Seemed confused.  Patient given a dose of her Ancef that she is on for her MRSA related sepsis.  Patient did have echocardiogram done during that hospitalization.  No distinct evidence of endocarditis.  Patient was treated for a pneumonia on the right side.  Today's chest x-ray shows that still present but seems to be improving not worse.  Patient's oxygen saturations were okay.  I think the history of a possible kidney stone could explain the acute  onset of the pain.  Patient was treated with 2 doses of fentanyl here which helped her some.  CT scan renal study ordered to evaluate this.  Patient's lactic acid was initially elevated above 2 but less than 3 she never was hypotensive.  Was not febrile.  Did have some borderline tachycardia.  Repeat lactic acid though is less than 2 so it is normal.  Disposition will be based on the CT renal study.  Patient does appear dehydrated so probably will warrant admission for IV hydration.   Shows significant worsening in patient's BUN and creatinine which goes along with the market mucosal dryness and dehydration.  CT the renal without evidence of any explanation for the abdominal pain.  There is persistent pulmonary findings on the right side.  Possibly could represent some worse pulmonary stop the patient without any hypoxia.  Urinalysis negative for urinary tract infection.  Urine drug screen negative.  Discussed with hospitalist for admission. Final Clinical Impressions(s) / ED Diagnoses   Final diagnoses:  Flank pain  Right-sided low back pain without sciatica, unspecified chronicity    ED Discharge Orders    None       Vanetta Mulders, MD 01/24/18 [phone]

## 2018-01-25 LAB — BASIC METABOLIC PANEL
ANION GAP: 6 (ref 5–15)
BUN: 14 mg/dL (ref 6–20)
CHLORIDE: 107 mmol/L (ref 98–111)
CO2: 26 mmol/L (ref 22–32)
Calcium: 8 mg/dL — ABNORMAL LOW (ref 8.9–10.3)
Creatinine, Ser: 0.76 mg/dL (ref 0.44–1.00)
GFR calc Af Amer: >60 mL/min (ref >60)
GFR calc non Af Amer: >60 mL/min (ref >60)
GLUCOSE: 85 mg/dL (ref 70–99)
POTASSIUM: 3.7 mmol/L (ref 3.5–5.1)
Sodium: 139 mmol/L (ref 135–145)

## 2018-01-25 LAB — CBC
HCT: 27 % — ABNORMAL LOW (ref 36.0–46.0)
Hemoglobin: 8.2 g/dL — ABNORMAL LOW (ref 12.0–15.0)
MCH: 27.8 pg (ref 26.0–34.0)
MCHC: 30.4 g/dL (ref 30.0–36.0)
MCV: 91.5 fL (ref 80.0–100.0)
Platelets: 140 10*3/uL — ABNORMAL LOW (ref 150–400)
RBC: 2.95 MIL/uL — AB (ref 3.87–5.11)
RDW: 13.2 % (ref 11.5–15.5)
WBC: 4.3 10*3/uL (ref 4.0–10.5)
nRBC: 0 % (ref 0.0–0.2)

## 2018-01-25 LAB — LIPASE, BLOOD: LIPASE: 48 U/L (ref 11–51)

## 2018-01-25 LAB — IRON AND TIBC
IRON: 39 ug/dL (ref 28–170)
SATURATION RATIOS: 15 % (ref 10.4–31.8)
TIBC: 263 ug/dL (ref 250–450)
UIBC: 224 ug/dL

## 2018-01-25 LAB — RETICULOCYTES
Immature Retic Fract: 21.6 % — ABNORMAL HIGH (ref 2.3–15.9)
RBC.: 2.92 MIL/uL — ABNORMAL LOW (ref 3.87–5.11)
RETIC COUNT ABSOLUTE: 97.5 10*3/uL (ref 19.0–186.0)
Retic Ct Pct: 3.3 % — ABNORMAL HIGH (ref 0.4–3.1)

## 2018-01-25 LAB — VITAMIN B12: Vitamin B-12: 373 pg/mL (ref 180–914)

## 2018-01-25 LAB — FOLATE: FOLATE: 6.1 ng/mL (ref >5.9)

## 2018-01-25 LAB — FERRITIN: Ferritin: 315 ng/mL — ABNORMAL HIGH (ref 11–307)

## 2018-01-25 MED ORDER — SODIUM CHLORIDE 0.9% FLUSH
10.0000 mL | Freq: Two times a day (BID) | INTRAVENOUS | Status: DC
  Administered 2018-01-25 – 2018-01-29 (×4): 10 mL

## 2018-01-25 MED ORDER — SODIUM CHLORIDE 0.9 % IV SOLN
100.0000 mg | Freq: Two times a day (BID) | INTRAVENOUS | Status: DC
  Administered 2018-01-25: 100 mg via INTRAVENOUS
  Filled 2018-01-25 (×5): qty 100

## 2018-01-25 MED ORDER — SODIUM CHLORIDE 0.9 % IV SOLN
INTRAVENOUS | Status: DC
  Administered 2018-01-25 – 2018-01-28 (×4): via INTRAVENOUS

## 2018-01-25 MED ORDER — CHLORHEXIDINE GLUCONATE CLOTH 2 % EX PADS
6.0000 | MEDICATED_PAD | Freq: Every day | CUTANEOUS | Status: DC
  Administered 2018-01-25 – 2018-01-28 (×4): 6 via TOPICAL

## 2018-01-25 MED ORDER — CEFAZOLIN SODIUM-DEXTROSE 2-4 GM/100ML-% IV SOLN: 2.0000 g | Freq: Three times a day (TID) | INTRAVENOUS | Status: DC

## 2018-01-25 MED ORDER — SENNOSIDES-DOCUSATE SODIUM 8.6-50 MG PO TABS
1.0000 | ORAL_TABLET | Freq: Two times a day (BID) | ORAL | Status: DC
  Administered 2018-01-25 – 2018-01-28 (×7): 1 via ORAL
  Filled 2018-01-25 (×8): qty 1

## 2018-01-25 MED ORDER — SODIUM CHLORIDE 0.9% FLUSH: 10.0000 mL | INTRAVENOUS | Status: DC | PRN

## 2018-01-25 MED ORDER — CEFAZOLIN SODIUM-DEXTROSE 2-4 GM/100ML-% IV SOLN
2.0000 g | Freq: Three times a day (TID) | INTRAVENOUS | Status: AC
  Administered 2018-01-25 – 2018-01-27 (×8): 2 g via INTRAVENOUS
  Filled 2018-01-25 (×8): qty 100

## 2018-01-25 MED ORDER — OXYCODONE-ACETAMINOPHEN 5-325 MG PO TABS
1.0000 | ORAL_TABLET | Freq: Four times a day (QID) | ORAL | Status: DC | PRN
  Administered 2018-01-25 – 2018-01-26 (×3): 1 via ORAL
  Filled 2018-01-25 (×3): qty 1

## 2018-01-25 NOTE — Progress Notes (Signed)
PROGRESS NOTE    Melanie [name]  ZOX:[id number]  DOB: 26-Jun-1965  DOA: 01/24/2018 PCP: Tanna Furry, MD   Brief Admission Hx: 52 y/o female with multiple medical problems including history of polysubstance abuse had refused SNF placement at last admission and insisted on going home with [facility].  She presented to ED c/o severe pain and hypersomnolence.  She has an extensive psychiatric and polysubstance abuse history.  Her UDS was negative. She was home with a PICC line for IV cefazolin to treat a MSSA bacteremia from pneumonia.  She was found to be dehydrated and was admitted for IV fluids.    MDM/Assessment & Plan:   1. AKI - Pt is clinically dehydrated but responding well to IV fluid hydration. Her repeat labs show improvement in renal function to normal.  Her IV fluids will be decreased.   2. MSSA bacteremia - Pt had been on IV cefazolin to complete full course of treatment.  She has been started on IV doxycycline given her new CXR findings and to treat atypical infection.  This also treats the staph bacteremia.  The END DATE for her course of IV antibiotics is 01/27/18.  At that time the staph bacteremia should be fully treated.  She does have repeated blood cultures from 10/25 that are in process.  3. Healthcare associated pneumonia - Pt was due to complete treatment on 10/28. CT of chest with multifocal atypical appearing Right lung infection could be associated with chronic aspiration or atypical mycobacterium type of infection.  I spoke with Dr. Drue Second of ID.  Pt should have pulmonary follow up.  Because patient is not having symptoms at this time, no coughing or producing sputum wound not need to change management at this time.  Will get patient back on cefazolin to complete full course.   4. Right flank pain - CT abdomen no acute findings.  No evidence of stone.  Pt has normalized lipase level this morning.  Monitor clinically.   5. Acute blood loss anemia - Pt has a marked  decline in hemoglobin possibly from hemodilution from IV hydration.  Will obtain anemia panel and hemoccult stools.  She may need a GI consultation tomorrow if hemoccult test is positive.  In that case would DC lovenox.  Continue protonix for now.  Follow stool hemoccult.   6. Lactic acidosis  - resolved with hydration.  7. History of polysubstance abuse - unfortunately patient is appearing more and more to have become dependent on opioids.  She was screaming out in pain on admission likely due to her having had run out of her opioid medications.  Her UDS was totally negative.  Careful with opioids.   8. Seizure disorder - she is reporting that she is not taking antiepileptics.  9. Bipolar disorder - She is currently not on treatment for this but does need psychiatric follow up arranged.   DVT prophylaxis: lovenox Code Status: Full  Family Communication: patient  Disposition Plan: SNF but patient has repeatedly declined in the past   Antimicrobials:  Cefazolin 10/14 to end on 10/28  Doxycycline 10/26  Subjective: Pt complains of occasional back pain but is awake and alert and oriented.   Objective: Vitals:   01/25/18 0100 01/25/18 0400 01/25/18 0406 01/25/18 0500  BP: 104/76 104/71  101/64  Pulse: 77 (!) 101  82  Resp: 18 (!) 23  (!) 21  Temp:   98.2 F (36.8 C)   TempSrc:   Oral   SpO2: 97% (!) 88%  97%  Weight:   53.3 kg   Height:        Intake/Output Summary (Last 24 hours) at 01/25/2018 0634 Last data filed at 01/25/2018 0407 Gross per 24 hour  Intake 413.97 ml  Output 350 ml  Net 63.97 ml   Filed Weights   01/24/18 1732 01/25/18 0406  Weight: 53.7 kg 53.3 kg     REVIEW OF SYSTEMS  As per history otherwise all reviewed and reported negative  Exam:  General exam: well developed female, NAD, cooperative.  Respiratory system: rales RUL.  No increased work of breathing. Cardiovascular system: S1 & S2 heard, RRR. No JVD, murmurs, gallops, clicks or pedal  edema. Gastrointestinal system: Abdomen is nondistended, soft and nontender. Normal bowel sounds heard. Central nervous system: Alert and oriented. No focal neurological deficits. Extremities: no CCE.  Data Reviewed: Basic Metabolic Panel: Recent Labs  Lab 01/24/18 1235 01/24/18 1325  NA 138 136  K 4.7 4.7  CL 101 103  CO2 26  --   GLUCOSE 164* 160*  BUN 21* 21*  CREATININE 1.41* 1.40*  CALCIUM 9.1  --    Liver Function Tests: Recent Labs  Lab 01/24/18 1235  AST 41  ALT 32  ALKPHOS 129*  BILITOT 0.9  PROT 7.1  ALBUMIN 2.9*   Recent Labs  Lab 01/24/18 1235  LIPASE 71*   No results for input(s): AMMONIA in the last 168 hours. CBC: Recent Labs  Lab 01/24/18 1235 01/24/18 1325  WBC 11.3*  --   NEUTROABS 9.2*  --   HGB 11.6* 11.9*  HCT 37.9 35.0*  MCV 91.3  --   PLT 254  --    Cardiac Enzymes: No results for input(s): CKTOTAL, CKMB, CKMBINDEX, TROPONINI in the last 168 hours. CBG (last 3)  Recent Labs    01/24/18 1218  GLUCAP 160*   Recent Results (from the past 240 hour(s))  Culture, blood (Routine X 2) w Reflex to ID Panel     Status: None (Preliminary result)   Collection Time: 01/24/18 12:49 PM  Result Value Ref Range Status   Specimen Description BLOOD BLOOD RIGHT ARM PICC LINE  Final   Special Requests   Final    BOTTLES DRAWN AEROBIC AND ANAEROBIC Blood Culture adequate volume   Culture   Final    NO GROWTH < 24 HOURS Performed at [facility], [address], [place], [place] [address]    Report Status PENDING  Incomplete  Culture, blood (Routine X 2) w Reflex to ID Panel     Status: None (Preliminary result)   Collection Time: 01/24/18  1:19 PM  Result Value Ref Range Status   Specimen Description BLOOD LEFT HAND  Final   Special Requests   Final    BOTTLES DRAWN AEROBIC ONLY Blood Culture results may not be optimal due to an inadequate volume of blood received in culture bottles   Culture   Final    NO GROWTH < 24 HOURS Performed at  [facility], [address], [place], [place] [address]    Report Status PENDING  Incomplete  MRSA PCR Screening     Status: None   Collection Time: 01/24/18  5:26 PM  Result Value Ref Range Status   MRSA by PCR NEGATIVE NEGATIVE Final    Comment:        The GeneXpert MRSA Assay (FDA approved for NASAL specimens only), is one component of a comprehensive MRSA colonization surveillance program. It is not intended to diagnose MRSA infection  nor to guide or monitor treatment for MRSA infections. Performed at [facility], [address] [place], [place] [address]      Studies: Ct Chest W Contrast  Result Date: 01/24/2018 CLINICAL DATA:  Pneumonia with chest pain EXAM: CT CHEST WITH CONTRAST TECHNIQUE: Multidetector CT imaging of the chest was performed during intravenous contrast administration. CONTRAST:  60mL ISOVUE-300 IOPAMIDOL (ISOVUE-300) INJECTION 61% COMPARISON:  Chest CTA 04/17/2017 Chest radiograph 01/24/2018 FINDINGS: Cardiovascular: No significant vascular findings. Normal heart size. No pericardial effusion. Calcific aortic atherosclerosis. Mediastinum/Nodes: No enlarged mediastinal, hilar, or axillary lymph nodes. Thyroid gland, trachea, and esophagus demonstrate no significant findings. Lungs/Pleura: There is multifocal consolidation within the right lung, greatest in the right upper lobe. The sites are associated with extensive cystic change. The left lung is clear. No pleural effusion or pneumothorax. Upper Abdomen: No acute abnormality. Musculoskeletal: No chest wall abnormality. No acute or significant osseous findings. IMPRESSION: 1. Multifocal cystic consolidation within the right lung, involving all lobes, with the largest area located in the right upper lobe. This may be secondary to chronic aspiration or atypical infection, such as non-tuberculous mycobacterium. 2.  Aortic Atherosclerosis (ICD10-I70.0). Electronically Signed   By: Deatra Robinson M.D.   On: 01/24/2018  22:08   Dg Chest Port 1 View  Result Date: 01/24/2018 CLINICAL DATA:  Altered mental status. EXAM: PORTABLE CHEST 1 VIEW COMPARISON:  Radiograph of January 06, 2018. FINDINGS: The heart size and mediastinal contours are within normal limits. No pneumothorax or pleural effusion is noted. Left lung is clear. Mildly improved right upper lobe opacity is noted suggesting improving pneumonia. The visualized skeletal structures are unremarkable. IMPRESSION: Mildly improved right upper lobe pneumonia. Electronically Signed   By: Lupita Raider, M.D.   On: 01/24/2018 12:46   Ct Renal Stone Study  Result Date: 01/24/2018 CLINICAL DATA:  52 year old female EXAM: CT ABDOMEN AND PELVIS WITHOUT CONTRAST TECHNIQUE: Multidetector CT imaging of the abdomen and pelvis was performed following the standard protocol without IV contrast. COMPARISON:  Chest CT 04/17/2017, abdominal CT 08/03/2016 FINDINGS: Lower chest: Multifocal airspace opacities of the right lower lung, a few with internal cavitation. This has progressed from the comparison CT of 04/17/2017 Hepatobiliary: Unremarkable liver.  Unremarkable gallbladder Pancreas: Unremarkable Spleen: Punctate calcifications of the spleen suggesting prior granulomatous disease Adrenals/Urinary Tract: Bilateral adrenal glands unremarkable. Right kidney without hydronephrosis or nephrolithiasis. Left kidney with nonobstructive stone at the superior collecting system. No hydronephrosis or perinephric stranding. Unremarkable course the bilateral ureters. Urinary bladder unremarkable with no radiopaque stones or inflammation. Stomach/Bowel: Unremarkable stomach. Unremarkable small bowel with no focal dilation. No transition point. No inflammatory changes. Normal appendix. Moderate stool burden. No focal wall thickening of the colon. Colonic diverticula without associated inflammation. Vascular/Lymphatic: Minimal calcifications of the aorta and iliac arteries. No lymphadenopathy.  Reproductive: Unremarkable pelvic structures. Other: None Musculoskeletal: No acute displaced fracture. Degenerative changes of the hips. IMPRESSION: No acute CT finding of the abdomen/pelvis. Multiple focal nodular opacities of the visualized right middle lobe and right lower lobe, less so on the left. These have progressed from the comparison chest CT of 04/17/2017. Findings are favored to represent infection of the right lung, with the differential including atypical infection and/or septic emboli. Metastatic disease cannot be excluded and correlation with the patient's history of malignancy is recommended. If further imaging is warranted, consider CT chest. Nonobstructive left-sided nephrolithiasis. Electronically Signed   By: Gilmer Mor D.O.   On: 01/24/2018 15:41   Scheduled Meds: . enoxaparin (LOVENOX) injection  40 mg Subcutaneous Q24H  . metoprolol tartrate  25 mg Oral BID  . pantoprazole  40 mg Oral Daily   Continuous Infusions: . sodium chloride 125 mL/hr at 01/25/18 0003  . doxycycline (VIBRAMYCIN) IV      Principal Problem:   AKI (acute kidney injury) (HCC) Active Problems:   BIPOLAR DISORDER UNSPECIFIED   Seizure (HCC)   Hypertension   MSSA (methicillin susceptible Staphylococcus aureus) pneumonia (HCC)   Acute right flank pain   Lactic acidosis  Critical Care Time spent: 32 mins  Standley Dakins, MD, FAAFP [facility] Pager [phone]  If 7PM-7AM, please contact night-coverage [email or web address] Password [email or web address] 01/25/2018, 6:34 AM    LOS: 1 day

## 2018-01-26 ENCOUNTER — Encounter (HOSPITAL_COMMUNITY): Payer: Self-pay | Admitting: Gastroenterology

## 2018-01-26 DIAGNOSIS — D649 Anemia, unspecified: Secondary | ICD-10-CM

## 2018-01-26 LAB — COMPREHENSIVE METABOLIC PANEL
ALBUMIN: 2.1 g/dL — AB (ref 3.5–5.0)
ALT: 12 U/L (ref 0–44)
AST: 21 U/L (ref 15–41)
Alkaline Phosphatase: 87 U/L (ref 38–126)
Anion gap: 5 (ref 5–15)
BILIRUBIN TOTAL: 0.6 mg/dL (ref 0.3–1.2)
BUN: 10 mg/dL (ref 6–20)
CO2: 24 mmol/L (ref 22–32)
Calcium: 7.7 mg/dL — ABNORMAL LOW (ref 8.9–10.3)
Chloride: 106 mmol/L (ref 98–111)
Creatinine, Ser: 0.68 mg/dL (ref 0.44–1.00)
GFR calc Af Amer: >60 mL/min (ref >60)
GFR calc non Af Amer: >60 mL/min (ref >60)
Glucose, Bld: 73 mg/dL (ref 70–99)
Potassium: 3.7 mmol/L (ref 3.5–5.1)
Sodium: 135 mmol/L (ref 135–145)
Total Protein: 4.9 g/dL — ABNORMAL LOW (ref 6.5–8.1)

## 2018-01-26 LAB — CBC
HEMATOCRIT: 25.4 % — AB (ref 36.0–46.0)
HEMOGLOBIN: 7.7 g/dL — AB (ref 12.0–15.0)
MCH: 28.2 pg (ref 26.0–34.0)
MCHC: 30.3 g/dL (ref 30.0–36.0)
MCV: 93 fL (ref 80.0–100.0)
NRBC: 0 % (ref 0.0–0.2)
Platelets: 129 10*3/uL — ABNORMAL LOW (ref 150–400)
RBC: 2.73 MIL/uL — AB (ref 3.87–5.11)
RDW: 13.9 % (ref 11.5–15.5)
WBC: 3.7 10*3/uL — AB (ref 4.0–10.5)

## 2018-01-26 LAB — OCCULT BLOOD X 1 CARD TO LAB, STOOL: Fecal Occult Bld: POSITIVE — AB

## 2018-01-26 MED ORDER — POLYETHYLENE GLYCOL 3350 17 G PO PACK
17.0000 g | PACK | Freq: Every day | ORAL | Status: DC
  Filled 2018-01-26 (×3): qty 1

## 2018-01-26 MED ORDER — METOPROLOL TARTRATE 25 MG PO TABS
12.5000 mg | ORAL_TABLET | Freq: Two times a day (BID) | ORAL | Status: DC
  Administered 2018-01-26 – 2018-01-29 (×5): 12.5 mg via ORAL
  Filled 2018-01-26 (×7): qty 1

## 2018-01-26 MED ORDER — OXYCODONE-ACETAMINOPHEN 5-325 MG PO TABS
1.0000 | ORAL_TABLET | Freq: Four times a day (QID) | ORAL | Status: DC | PRN
  Administered 2018-01-26 – 2018-01-29 (×12): 2 via ORAL
  Filled 2018-01-26 (×13): qty 2

## 2018-01-26 NOTE — Consult Note (Signed)
Referring Provider: Dr. Laural Benes  Primary Care Physician:  Tanna Furry, MD Primary Gastroenterologist:  Dr. Jena Gauss   Date of Admission: 01/24/18 Date of Consultation: 01/26/18  Reason for Consultation:  Anemia, suspected GI bleeding   HPI:  Melanie [name] is a 52 y.o. year old female who was recently inpatient Oct 7-15 with pneumonia, MSSA bacteremia, TEE without vegetations, to be treated for 2 weeks with IV Ancef via PICC line, refusing SNF placement at that time, presenting 10/25 with right flank pain, acute renal injury. CT renal stone study with nonobstructive left-sided nephrolithiasis, CT chest with multifocal cystic consolidation within right lung. She should be completing antibiotic therapy course on 10/28 for MSSA bacteremia, HAP.   GI consulted for anemia. Unknown baseline, but earlier this year Hgb was in the 14/15 range. Early October 9-11 range and has now drifted down to 7.7 this morning. No overt GI bleeding. Unknown hemoccult status. No prior colonoscopy. Reports possible history of PUD at least 30 years ago but is unable to tell me how this was diagnosed. Rare Ibuprofen for headaches. Rare reflux and will take occasional Tums. Right flank pain intermittently. Vague epigastric "hardness" per patient, "bloating", uncomfortable at times but not "as bad as my back". No solid food dysphagia. Occasionally will have difficulty swallowing large pills.   No concerning lower GI symptoms. Will have BM every 2-3 days, which is her normal. No family history of colon cancer or polyps.   Past Medical History:  Diagnosis Date  . Asthma   . GERD (gastroesophageal reflux disease)   . Head injury, acute    History of subdural hematoma following salicylate poisoning.  Marland Kitchen Hypertension   . Pneumonia   . Risk for falls   . Short-term memory loss   . SUBDURAL HEMATOMA 04/08/2009   Qualifier: History of  By: Logan Bores MD, Casimiro Needle      Past Surgical History:  Procedure Laterality Date   . BRAIN SURGERY    . HERNIA REPAIR    . TEE WITHOUT CARDIOVERSION N/A 01/13/2018   Procedure: TRANSESOPHAGEAL ECHOCARDIOGRAM (TEE);  Surgeon: Laqueta Linden, MD;  Location: [facility];  Service: Cardiovascular;  Laterality: N/A;    Prior to Admission medications   Medication Sig Start Date End Date Taking? Authorizing Provider  albuterol (PROVENTIL HFA;VENTOLIN HFA) 108 (90 Base) MCG/ACT inhaler Inhale 2 puffs into the lungs every 4 (four) hours as needed for wheezing or shortness of breath (or coughing). 05/14/17  Yes Dione Booze, MD  metoprolol tartrate (LOPRESSOR) 25 MG tablet Take 1 tablet (25 mg total) by mouth 2 (two) times daily. 01/14/18 02/13/18 Yes Johnson, Clanford L, MD  pantoprazole (PROTONIX) 40 MG tablet Take 1 tablet (40 mg total) by mouth daily for 14 days. 01/15/18 01/29/18 Yes Cleora Fleet, MD    Current Facility-Administered Medications  Medication Dose Route Frequency Provider Last Rate Last Dose  . 0.9 %  sodium chloride infusion   Intravenous Continuous Laural Benes, Clanford L, MD 35 mL/hr at 01/26/18 0656    . albuterol (PROVENTIL) (2.5 MG/3ML) 0.083% nebulizer solution 3 mL  3 mL Inhalation Q4H PRN Levie Heritage, DO      . ceFAZolin (ANCEF) IVPB 2g/100 mL premix  2 g Intravenous Q8H Johnson, Clanford L, MD 200 mL/hr at 01/26/18 0512 2 g at 01/26/18 0512  . Chlorhexidine Gluconate Cloth 2 % PADS 6 each  6 each Topical Daily Laural Benes, Clanford L, MD   6 each at 01/26/18 1035  . cyclobenzaprine (FLEXERIL)  tablet 5 mg  5 mg Oral TID PRN Levie Heritage, DO   5 mg at 01/26/18 0241  . metoprolol tartrate (LOPRESSOR) tablet 12.5 mg  12.5 mg Oral BID Johnson, Clanford L, MD      . oxyCODONE-acetaminophen (PERCOCET/ROXICET) 5-325 MG per tablet 1-2 tablet  1-2 tablet Oral Q6H PRN Laural Benes, Clanford L, MD   2 tablet at 01/26/18 1035  . pantoprazole (PROTONIX) EC tablet 40 mg  40 mg Oral Daily Levie Heritage, DO   40 mg at 01/26/18 1035  . polyethylene glycol  (MIRALAX / GLYCOLAX) packet 17 g  17 g Oral Daily Johnson, Clanford L, MD      . promethazine (PHENERGAN) tablet 12.5 mg  12.5 mg Oral Q6H PRN Levie Heritage, DO      . senna-docusate (Senokot-S) tablet 1 tablet  1 tablet Oral BID Laural Benes, Clanford L, MD   1 tablet at 01/26/18 1034  . sodium chloride flush (NS) 0.9 % injection 10-40 mL  10-40 mL Intracatheter Q12H Johnson, Clanford L, MD   10 mL at 01/26/18 1035  . sodium chloride flush (NS) 0.9 % injection 10-40 mL  10-40 mL Intracatheter PRN Cleora Fleet, MD        Allergies as of 01/24/2018  . (No Known Allergies)    Family History  Problem Relation Age of Onset  . Hypertension Father   . Diabetes Father   . Hypertension Other   . Diabetes Other   . Colon cancer Neg Hx   . Colon polyps Neg Hx     Social History   Socioeconomic History  . Marital status: Divorced    Spouse name: Not on file  . Number of children: Not on file  . Years of education: Not on file  . Highest education level: Not on file  Occupational History  . Not on file  Social Needs  . Financial resource strain: Not on file  . Food insecurity:    Worry: Not on file    Inability: Not on file  . Transportation needs:    Medical: Not on file    Non-medical: Not on file  Tobacco Use  . Smoking status: Current Every Day Smoker    Packs/day: 1.00    Years: 35.00    Pack years: 35.00    Types: Cigarettes  . Smokeless tobacco: Never Used  Substance and Sexual Activity  . Alcohol use: Yes    Comment: occas  . Drug use: No    Comment: pt snorts px meds.  . Sexual activity: Not on file  Lifestyle  . Physical activity:    Days per week: Not on file    Minutes per session: Not on file  . Stress: Not on file  Relationships  . Social connections:    Talks on phone: Not on file    Gets together: Not on file    Attends religious service: Not on file    Active member of club or organization: Not on file    Attends meetings of clubs or  organizations: Not on file    Relationship status: Not on file  . Intimate partner violence:    Fear of current or ex partner: Not on file    Emotionally abused: Not on file    Physically abused: Not on file    Forced sexual activity: Not on file  Other Topics Concern  . Not on file  Social History Narrative   ** Merged History Encounter **  Review of Systems: Gen: Denies fever, chills, loss of appetite, change in weight or weight loss CV: Denies chest pain, heart palpitations, syncope, edema  Resp: Denies shortness of breath with rest, cough, wheezing GI: see HPI  GU : Denies urinary burning, urinary frequency, urinary incontinence.  MS: Denies joint pain,swelling, cramping Derm: Denies rash, itching, dry skin Psych: Denies depression, anxiety,confusion, or memory loss Heme: see HPI   Physical Exam: Vital signs in last 24 hours: Temp:  [97.6 F (36.4 C)-98.5 F (36.9 C)] 98.3 F (36.8 C) (10/27 1102) Pulse Rate:  [80-89] 87 (10/27 1102) Resp:  [16-22] 19 (10/27 1102) BP: (91-106)/(58-93) 91/61 (10/27 1102) SpO2:  [78 %-95 %] 95 % (10/27 1102) Weight:  [54.7 kg] 54.7 kg (10/27 0500) Last BM Date: 01/23/18 General:   Alert,  Appears older than stated age, no distress  Head:  Normocephalic and atraumatic. Eyes:  Sclera clear, no icterus.   Conjunctiva pink. Ears:  Normal auditory acuity. Nose:  No deformity, discharge,  or lesions. Mouth:  No deformity or lesions, poor dentition  Lungs:  Clear throughout to auscultation.   Heart:  S1 S2 present without murmurs  Abdomen:  Soft, mild TTP epigastric/LUQ, no rebound or guarding Rectal:  Deferred until time of colonoscopy.   Msk:  Symmetrical without gross deformities. Normal posture. Extremities:  Without  edema. Neurologic:  Alert and  oriented x4 Psych:  Alert and cooperative. Normal mood and affect.  Intake/Output from previous day: 10/26 0701 - 10/27 0700 In: -  Out: 300 [Urine:300] Intake/Output this  shift: No intake/output data recorded.  Lab Results: Recent Labs    01/24/18 1235 01/24/18 1325 01/25/18 0737 01/26/18 0502  WBC 11.3*  --  4.3 3.7*  HGB 11.6* 11.9* 8.2* 7.7*  HCT 37.9 35.0* 27.0* 25.4*  PLT 254  --  140* 129*   BMET Recent Labs    01/24/18 1235 01/24/18 1325 01/25/18 0449 01/26/18 0502  NA 138 136 139 135  K 4.7 4.7 3.7 3.7  CL 101 103 107 106  CO2 26  --  26 24  GLUCOSE 164* 160* 85 73  BUN 21* 21* 14 10  CREATININE 1.41* 1.40* 0.76 0.68  CALCIUM 9.1  --  8.0* 7.7*   LFT Recent Labs    01/24/18 1235 01/26/18 0502  PROT 7.1 4.9*  ALBUMIN 2.9* 2.1*  AST 41 21  ALT 32 12  ALKPHOS 129* 87  BILITOT 0.9 0.6   Lab Results  Component Value Date   IRON 39 01/25/2018   TIBC 263 01/25/2018   FERRITIN 315 (H) 01/25/2018    Studies/Results: Ct Chest W Contrast  Result Date: 01/24/2018 CLINICAL DATA:  Pneumonia with chest pain EXAM: CT CHEST WITH CONTRAST TECHNIQUE: Multidetector CT imaging of the chest was performed during intravenous contrast administration. CONTRAST:  60mL ISOVUE-300 IOPAMIDOL (ISOVUE-300) INJECTION 61% COMPARISON:  Chest CTA 04/17/2017 Chest radiograph 01/24/2018 FINDINGS: Cardiovascular: No significant vascular findings. Normal heart size. No pericardial effusion. Calcific aortic atherosclerosis. Mediastinum/Nodes: No enlarged mediastinal, hilar, or axillary lymph nodes. Thyroid gland, trachea, and esophagus demonstrate no significant findings. Lungs/Pleura: There is multifocal consolidation within the right lung, greatest in the right upper lobe. The sites are associated with extensive cystic change. The left lung is clear. No pleural effusion or pneumothorax. Upper Abdomen: No acute abnormality. Musculoskeletal: No chest wall abnormality. No acute or significant osseous findings. IMPRESSION: 1. Multifocal cystic consolidation within the right lung, involving all lobes, with the largest area located in the right upper lobe.  This may  be secondary to chronic aspiration or atypical infection, such as non-tuberculous mycobacterium. 2.  Aortic Atherosclerosis (ICD10-I70.0). Electronically Signed   By: Deatra Robinson M.D.   On: 01/24/2018 22:08   Dg Chest Port 1 View  Result Date: 01/24/2018 CLINICAL DATA:  Altered mental status. EXAM: PORTABLE CHEST 1 VIEW COMPARISON:  Radiograph of January 06, 2018. FINDINGS: The heart size and mediastinal contours are within normal limits. No pneumothorax or pleural effusion is noted. Left lung is clear. Mildly improved right upper lobe opacity is noted suggesting improving pneumonia. The visualized skeletal structures are unremarkable. IMPRESSION: Mildly improved right upper lobe pneumonia. Electronically Signed   By: Lupita Raider, M.D.   On: 01/24/2018 12:46   Ct Renal Stone Study  Result Date: 01/24/2018 CLINICAL DATA:  52 year old female EXAM: CT ABDOMEN AND PELVIS WITHOUT CONTRAST TECHNIQUE: Multidetector CT imaging of the abdomen and pelvis was performed following the standard protocol without IV contrast. COMPARISON:  Chest CT 04/17/2017, abdominal CT 08/03/2016 FINDINGS: Lower chest: Multifocal airspace opacities of the right lower lung, a few with internal cavitation. This has progressed from the comparison CT of 04/17/2017 Hepatobiliary: Unremarkable liver.  Unremarkable gallbladder Pancreas: Unremarkable Spleen: Punctate calcifications of the spleen suggesting prior granulomatous disease Adrenals/Urinary Tract: Bilateral adrenal glands unremarkable. Right kidney without hydronephrosis or nephrolithiasis. Left kidney with nonobstructive stone at the superior collecting system. No hydronephrosis or perinephric stranding. Unremarkable course the bilateral ureters. Urinary bladder unremarkable with no radiopaque stones or inflammation. Stomach/Bowel: Unremarkable stomach. Unremarkable small bowel with no focal dilation. No transition point. No inflammatory changes. Normal appendix. Moderate stool  burden. No focal wall thickening of the colon. Colonic diverticula without associated inflammation. Vascular/Lymphatic: Minimal calcifications of the aorta and iliac arteries. No lymphadenopathy. Reproductive: Unremarkable pelvic structures. Other: None Musculoskeletal: No acute displaced fracture. Degenerative changes of the hips. IMPRESSION: No acute CT finding of the abdomen/pelvis. Multiple focal nodular opacities of the visualized right middle lobe and right lower lobe, less so on the left. These have progressed from the comparison chest CT of 04/17/2017. Findings are favored to represent infection of the right lung, with the differential including atypical infection and/or septic emboli. Metastatic disease cannot be excluded and correlation with the patient's history of malignancy is recommended. If further imaging is warranted, consider CT chest. Nonobstructive left-sided nephrolithiasis. Electronically Signed   By: Gilmer Mor D.O.   On: 01/24/2018 15:41    Impression: 52 year old female presenting with right flank pain, acute renal injury, now with worsening anemia without overt GI bleeding. Hgb 11 range on admission and steadily drifting, now 7.7. Unknown hemoccult status. No prior colonoscopy, and she reports vague history of possible PUD at least 30 years ago but is unsure if endoscopic evaluation was completed at that time. No real concerning upper or lower GI signs/symptoms, but she does report a vague epigastric bloating and discomfort at times.   Currently finishing IV antibiotic therapy for pneumonia, MSSA bacteremia, with end date of antibiotics Jan 27, 2018. PICC line in place. Respiratory status near baseline.   Iron low normal and ferritin elevated, but could be falsely elevated in setting of acute illness. At minimum would pursue EGD prior to discharge, with possible colonoscopy as outpatient. If heme positive, would favor colonoscopy/EGD while inpatient.    Plan: Hemoccult  stools Continue PPI therapy At minimum EGD prior to discharge Will reassess in the morning.  Gelene Mink, PhD, ANP-BC [facility]      LOS: 2 days  01/26/2018, 11:34 AM

## 2018-01-26 NOTE — Progress Notes (Addendum)
01/26/2018 3:53 PM  Update:  Stool hemoccult positive. Repeated BCs from 01/24/18 are growing Gram positive cocci: continue current abx for now pending further ID and sensitivities.  I worry that patient wasn't getting her IV antibiotics at home as prescribed and the infection is not controlled.  We may need to extend her course of antibiotic therapy beyond the current end date of 01/27/18.  I am surprised that she has bacteremia as her previous cultures from 10/10 were negative and her TEE was negative for vegetations.  She had refused SNF placement at last admission but I will ask social worker to meet again with patient and I have asked her to reconsider.   Maryln Manuel MD

## 2018-01-26 NOTE — Progress Notes (Addendum)
PROGRESS NOTE    Melanie [name]  ZOX:[id number]  DOB: June 19, 1965  DOA: 01/24/2018 PCP: Tanna Furry, MD   Brief Admission Hx: 52 y/o female with multiple medical problems including history of polysubstance abuse had refused SNF placement at last admission and insisted on going home with [facility].  She presented to ED c/o severe pain and hypersomnolence.  She has an extensive psychiatric and polysubstance abuse history.  Her UDS was negative. She was home with a PICC line for IV cefazolin to treat a MSSA bacteremia from pneumonia.  She was found to be dehydrated and was admitted for IV fluids.    MDM/Assessment & Plan:   1. AKI - RESOLVED. Pt was initially dehydrated but responded well to IV fluid hydration which has been cut back. Her repeat labs show improvement in renal function to normal.  Her IV fluids will be further decreased.   2. MSSA bacteremia - Pt had been on IV cefazolin to complete full course of treatment.  The END DATE for her course of IV antibiotics is 01/27/18.  At that time the staph bacteremia should be fully treated.  She does have repeated blood cultures from 10/25 that are in process but no growth to date.  3. Healthcare associated pneumonia - Pt was due to complete treatment on 10/28. CT of chest with multifocal atypical appearing right lung infection could be associated with chronic aspiration or atypical mycobacterium type of infection.  I spoke with Dr. Drue Second of ID.  Pt should have pulmonary follow up.  Because patient is not having symptoms at this time, no coughing or producing sputum wound not need to change management at this time.  4. Right flank pain - CT abdomen no acute findings.  No evidence of stone.  Pt has normalized lipase level this morning.  Monitor clinically.   5. Acute blood loss anemia - Pt has a marked decline in hemoglobin possibly from hemodilution from IV hydration.  Will obtain anemia panel and hemoccult stools.  GI consultation pending.   DC lovenox.  Continue protonix for now.   t.   6. HTN - blood pressures have been a little soft, have reduced metoprolol by 50%.  7. Lactic acidosis  - resolved with hydration.  8. Chronic low back pain - pt had MRI of L spine earlier this month with no acute findings.  9. History of polysubstance abuse - unfortunately patient is appearing more and more to have become dependent on opioids.  She was screaming out in pain on admission likely due to her having had run out of her opioid medications.  Her UDS was totally negative.  Careful with opioids.   10. Seizure disorder - she is reporting that she is not taking antiepileptics.  11. Bipolar disorder - She is currently not on treatment for this but does need psychiatric follow up arranged.   DVT prophylaxis: SCDs Code Status: Full  Family Communication: patient  Disposition Plan: SNF but patient has repeatedly declined in the past, likely could DC in 1-2 days   Antimicrobials:  Cefazolin 10/14 to end on 10/28  Doxycycline 10/26  Subjective: Pt continues to complain of low back pain.  She is asking for IV opioids.  Pt says toradol doesn't work for her. Also says tramadol doesn't work for her.  No other complaints.   Objective: Vitals:   01/25/18 2200 01/26/18 0000 01/26/18 0338 01/26/18 0500  BP: 100/70     Pulse: 80     Resp: (!) 22  Temp:  98.5 F (36.9 C) 97.7 F (36.5 C)   TempSrc:  Oral Oral   SpO2: 94%     Weight:    54.7 kg  Height:        Intake/Output Summary (Last 24 hours) at 01/26/2018 0644 Last data filed at 01/25/2018 2200 Gross per 24 hour  Intake -  Output 600 ml  Net -600 ml   Filed Weights   01/24/18 1732 01/25/18 0406 01/26/18 0500  Weight: 53.7 kg 53.3 kg 54.7 kg   REVIEW OF SYSTEMS  As per history otherwise all reviewed and reported negative  Exam:  General exam: well developed female, NAD, cooperative.  Respiratory system: rales RUL.  No increased work of breathing. Cardiovascular system:  S1 & S2 heard, RRR. No JVD, murmurs, gallops, clicks or pedal edema. Gastrointestinal system: Abdomen is nondistended, soft and nontender. Normal bowel sounds heard. Back exam: spinal tenderness of L spine with palpation. Paraspinal muscle spasm noted.    Central nervous system: Alert and oriented. No focal neurological deficits. Extremities: no CCE.  Data Reviewed: Basic Metabolic Panel: Recent Labs  Lab 01/24/18 1235 01/24/18 1325 01/25/18 0449  NA 138 136 139  K 4.7 4.7 3.7  CL 101 103 107  CO2 26  --  26  GLUCOSE 164* 160* 85  BUN 21* 21* 14  CREATININE 1.41* 1.40* 0.76  CALCIUM 9.1  --  8.0*   Liver Function Tests: Recent Labs  Lab 01/24/18 1235  AST 41  ALT 32  ALKPHOS 129*  BILITOT 0.9  PROT 7.1  ALBUMIN 2.9*   Recent Labs  Lab 01/24/18 1235 01/25/18 0449  LIPASE 71* 48   No results for input(s): AMMONIA in the last 168 hours. CBC: Recent Labs  Lab 01/24/18 1235 01/24/18 1325 01/25/18 0737 01/26/18 0502  WBC 11.3*  --  4.3 3.7*  NEUTROABS 9.2*  --   --   --   HGB 11.6* 11.9* 8.2* 7.7*  HCT 37.9 35.0* 27.0* 25.4*  MCV 91.3  --  91.5 93.0  PLT 254  --  140* 129*   Cardiac Enzymes: No results for input(s): CKTOTAL, CKMB, CKMBINDEX, TROPONINI in the last 168 hours. CBG (last 3)  Recent Labs    01/24/18 1218  GLUCAP 160*   Recent Results (from the past 240 hour(s))  Culture, blood (Routine X 2) w Reflex to ID Panel     Status: None (Preliminary result)   Collection Time: 01/24/18 12:49 PM  Result Value Ref Range Status   Specimen Description BLOOD BLOOD RIGHT ARM PICC LINE  Final   Special Requests   Final    BOTTLES DRAWN AEROBIC AND ANAEROBIC Blood Culture adequate volume   Culture   Final    NO GROWTH 2 DAYS Performed at [facility], [address], [place], [place] [address]    Report Status PENDING  Incomplete  Culture, blood (Routine X 2) w Reflex to ID Panel     Status: None (Preliminary result)   Collection Time: 01/24/18  1:19 PM    Result Value Ref Range Status   Specimen Description BLOOD LEFT HAND  Final   Special Requests   Final    BOTTLES DRAWN AEROBIC ONLY Blood Culture results may not be optimal due to an inadequate volume of blood received in culture bottles   Culture   Final    NO GROWTH 2 DAYS Performed at [facility], [address], [place], [place] [address]    Report Status PENDING  Incomplete  MRSA PCR Screening     Status: None   Collection Time: 01/24/18  5:26 PM  Result Value Ref Range Status   MRSA by PCR NEGATIVE NEGATIVE Final    Comment:        The GeneXpert MRSA Assay (FDA approved for NASAL specimens only), is one component of a comprehensive MRSA colonization surveillance program. It is not intended to diagnose MRSA infection nor to guide or monitor treatment for MRSA infections. Performed at [facility], [address], [place], [place] [address]      Studies: Ct Chest W Contrast  Result Date: 01/24/2018 CLINICAL DATA:  Pneumonia with chest pain EXAM: CT CHEST WITH CONTRAST TECHNIQUE: Multidetector CT imaging of the chest was performed during intravenous contrast administration. CONTRAST:  60mL ISOVUE-300 IOPAMIDOL (ISOVUE-300) INJECTION 61% COMPARISON:  Chest CTA 04/17/2017 Chest radiograph 01/24/2018 FINDINGS: Cardiovascular: No significant vascular findings. Normal heart size. No pericardial effusion. Calcific aortic atherosclerosis. Mediastinum/Nodes: No enlarged mediastinal, hilar, or axillary lymph nodes. Thyroid gland, trachea, and esophagus demonstrate no significant findings. Lungs/Pleura: There is multifocal consolidation within the right lung, greatest in the right upper lobe. The sites are associated with extensive cystic change. The left lung is clear. No pleural effusion or pneumothorax. Upper Abdomen: No acute abnormality. Musculoskeletal: No chest wall abnormality. No acute or significant osseous findings. IMPRESSION: 1. Multifocal cystic consolidation within the right  lung, involving all lobes, with the largest area located in the right upper lobe. This may be secondary to chronic aspiration or atypical infection, such as non-tuberculous mycobacterium. 2.  Aortic Atherosclerosis (ICD10-I70.0). Electronically Signed   By: Deatra Robinson M.D.   On: 01/24/2018 22:08   Dg Chest Port 1 View  Result Date: 01/24/2018 CLINICAL DATA:  Altered mental status. EXAM: PORTABLE CHEST 1 VIEW COMPARISON:  Radiograph of January 06, 2018. FINDINGS: The heart size and mediastinal contours are within normal limits. No pneumothorax or pleural effusion is noted. Left lung is clear. Mildly improved right upper lobe opacity is noted suggesting improving pneumonia. The visualized skeletal structures are unremarkable. IMPRESSION: Mildly improved right upper lobe pneumonia. Electronically Signed   By: Lupita Raider, M.D.   On: 01/24/2018 12:46   Ct Renal Stone Study  Result Date: 01/24/2018 CLINICAL DATA:  52 year old female EXAM: CT ABDOMEN AND PELVIS WITHOUT CONTRAST TECHNIQUE: Multidetector CT imaging of the abdomen and pelvis was performed following the standard protocol without IV contrast. COMPARISON:  Chest CT 04/17/2017, abdominal CT 08/03/2016 FINDINGS: Lower chest: Multifocal airspace opacities of the right lower lung, a few with internal cavitation. This has progressed from the comparison CT of 04/17/2017 Hepatobiliary: Unremarkable liver.  Unremarkable gallbladder Pancreas: Unremarkable Spleen: Punctate calcifications of the spleen suggesting prior granulomatous disease Adrenals/Urinary Tract: Bilateral adrenal glands unremarkable. Right kidney without hydronephrosis or nephrolithiasis. Left kidney with nonobstructive stone at the superior collecting system. No hydronephrosis or perinephric stranding. Unremarkable course the bilateral ureters. Urinary bladder unremarkable with no radiopaque stones or inflammation. Stomach/Bowel: Unremarkable stomach. Unremarkable small bowel with no  focal dilation. No transition point. No inflammatory changes. Normal appendix. Moderate stool burden. No focal wall thickening of the colon. Colonic diverticula without associated inflammation. Vascular/Lymphatic: Minimal calcifications of the aorta and iliac arteries. No lymphadenopathy. Reproductive: Unremarkable pelvic structures. Other: None Musculoskeletal: No acute displaced fracture. Degenerative changes of the hips. IMPRESSION: No acute CT finding of the abdomen/pelvis. Multiple focal nodular opacities of the visualized right middle lobe and right lower lobe, less so on the left. These have progressed from the comparison chest  CT of 04/17/2017. Findings are favored to represent infection of the right lung, with the differential including atypical infection and/or septic emboli. Metastatic disease cannot be excluded and correlation with the patient's history of malignancy is recommended. If further imaging is warranted, consider CT chest. Nonobstructive left-sided nephrolithiasis. Electronically Signed   By: Gilmer Mor D.O.   On: 01/24/2018 15:41   Scheduled Meds: . Chlorhexidine Gluconate Cloth  6 each Topical Daily  . enoxaparin (LOVENOX) injection  40 mg Subcutaneous Q24H  . metoprolol tartrate  25 mg Oral BID  . pantoprazole  40 mg Oral Daily  . senna-docusate  1 tablet Oral BID  . sodium chloride flush  10-40 mL Intracatheter Q12H   Continuous Infusions: . sodium chloride 70 mL/hr at 01/26/18 0243  . ceFAZolin 2 g (01/26/18 9629)    Principal Problem:   AKI (acute kidney injury) (HCC) Active Problems:   BIPOLAR DISORDER UNSPECIFIED   Seizure (HCC)   Hypertension   MSSA (methicillin susceptible Staphylococcus aureus) pneumonia (HCC)   Acute right flank pain   Lactic acidosis  Standley Dakins, MD, FAAFP [facility] Pager [phone]  If 7PM-7AM, please contact night-coverage [email or web address] Password [email or web address] 01/26/2018, 6:44 AM    LOS: 2 days

## 2018-01-27 ENCOUNTER — Encounter (HOSPITAL_COMMUNITY): Payer: Self-pay | Admitting: *Deleted

## 2018-01-27 ENCOUNTER — Other Ambulatory Visit: Payer: Self-pay

## 2018-01-27 DIAGNOSIS — M545 Low back pain, unspecified: Secondary | ICD-10-CM

## 2018-01-27 LAB — CULTURE, BLOOD (ROUTINE X 2): Special Requests: ADEQUATE

## 2018-01-27 LAB — BLOOD CULTURE ID PANEL (REFLEXED)
Acinetobacter baumannii: NOT DETECTED
CANDIDA ALBICANS: NOT DETECTED
CANDIDA GLABRATA: NOT DETECTED
Candida krusei: NOT DETECTED
Candida parapsilosis: NOT DETECTED
Candida tropicalis: NOT DETECTED
ENTEROBACTER CLOACAE COMPLEX: NOT DETECTED
ENTEROBACTERIACEAE SPECIES: NOT DETECTED
ENTEROCOCCUS SPECIES: NOT DETECTED
Escherichia coli: NOT DETECTED
Haemophilus influenzae: NOT DETECTED
Klebsiella oxytoca: NOT DETECTED
Klebsiella pneumoniae: NOT DETECTED
LISTERIA MONOCYTOGENES: NOT DETECTED
Methicillin resistance: DETECTED — AB
NEISSERIA MENINGITIDIS: NOT DETECTED
PSEUDOMONAS AERUGINOSA: NOT DETECTED
Proteus species: NOT DETECTED
STREPTOCOCCUS AGALACTIAE: NOT DETECTED
STREPTOCOCCUS PYOGENES: NOT DETECTED
STREPTOCOCCUS SPECIES: NOT DETECTED
Serratia marcescens: NOT DETECTED
Staphylococcus aureus (BCID): NOT DETECTED
Staphylococcus species: DETECTED — AB
Streptococcus pneumoniae: NOT DETECTED

## 2018-01-27 LAB — BASIC METABOLIC PANEL
ANION GAP: 4 — AB (ref 5–15)
BUN: 8 mg/dL (ref 6–20)
CO2: 28 mmol/L (ref 22–32)
Calcium: 7.8 mg/dL — ABNORMAL LOW (ref 8.9–10.3)
Chloride: 103 mmol/L (ref 98–111)
Creatinine, Ser: 0.75 mg/dL (ref 0.44–1.00)
GFR calc Af Amer: >60 mL/min (ref >60)
Glucose, Bld: 85 mg/dL (ref 70–99)
POTASSIUM: 3.7 mmol/L (ref 3.5–5.1)
SODIUM: 135 mmol/L (ref 135–145)

## 2018-01-27 LAB — CBC
HCT: 26.8 % — ABNORMAL LOW (ref 36.0–46.0)
Hemoglobin: 8.1 g/dL — ABNORMAL LOW (ref 12.0–15.0)
MCH: 27.8 pg (ref 26.0–34.0)
MCHC: 30.2 g/dL (ref 30.0–36.0)
MCV: 92.1 fL (ref 80.0–100.0)
PLATELETS: 123 10*3/uL — AB (ref 150–400)
RBC: 2.91 MIL/uL — AB (ref 3.87–5.11)
RDW: 14 % (ref 11.5–15.5)
WBC: 4.3 10*3/uL (ref 4.0–10.5)
nRBC: 0 % (ref 0.0–0.2)

## 2018-01-27 MED ORDER — POLYETHYLENE GLYCOL 3350 17 G PO PACK
17.0000 g | PACK | ORAL | Status: AC
  Administered 2018-01-27 (×3): 17 g via ORAL
  Filled 2018-01-27 (×3): qty 1

## 2018-01-27 MED ORDER — PEG 3350-KCL-NA BICARB-NACL 420 G PO SOLR: 2000.0000 mL | Freq: Once | ORAL | Status: DC

## 2018-01-27 MED ORDER — PANTOPRAZOLE SODIUM 40 MG PO TBEC
40.0000 mg | DELAYED_RELEASE_TABLET | Freq: Every day | ORAL | Status: DC
  Administered 2018-01-28 – 2018-01-29 (×2): 40 mg via ORAL
  Filled 2018-01-27 (×2): qty 1

## 2018-01-27 MED ORDER — PEG 3350-KCL-NA BICARB-NACL 420 G PO SOLR
2000.0000 mL | Freq: Once | ORAL | Status: AC
  Administered 2018-01-27: 2000 mL via ORAL
  Filled 2018-01-27: qty 4000

## 2018-01-27 MED ORDER — ONDANSETRON HCL 4 MG/2ML IJ SOLN
4.0000 mg | Freq: Three times a day (TID) | INTRAMUSCULAR | Status: DC
  Administered 2018-01-27 – 2018-01-29 (×8): 4 mg via INTRAVENOUS
  Filled 2018-01-27 (×8): qty 2

## 2018-01-27 MED ORDER — SODIUM CHLORIDE 0.9 % IV SOLN: INTRAVENOUS | Status: DC

## 2018-01-27 MED ORDER — ONDANSETRON HCL 4 MG/2ML IJ SOLN: 4.0000 mg | Freq: Three times a day (TID) | INTRAMUSCULAR | Status: DC

## 2018-01-27 MED ORDER — BISACODYL 5 MG PO TBEC
10.0000 mg | DELAYED_RELEASE_TABLET | ORAL | Status: AC
  Administered 2018-01-27 (×2): 10 mg via ORAL
  Filled 2018-01-27 (×2): qty 2

## 2018-01-27 NOTE — Evaluation (Signed)
Physical Therapy Evaluation Patient Details Name: Melanie [name] MRN: [id number] DOB: 05/07/1965 Today's Date: 01/27/2018   History of Present Illness  Melanie [name] is a 52 y.o. 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  Obtained by medical records as patient unable to provide much of the history.  Newly hospitalized from October 7 until October 15 following a MSSA related sepsis and pneumonia.  Patient has PICC line for IV antibiotics at home.  The patient has been getting Ancef 3 times a day for antibiotics.  Yesterday, the patient was fairly normal and without much back pain.  Today, the patient has had increased pain and the patient was hysterically crying in pain.  Patient was brought to the emergency department for evaluation.  Patient has become fairly comfortable and falls asleep with fentanyl, but begins to moan and writhe with in pain awoken.    Clinical Impression  Patient presents seated at bedside with c/o accidentally cutting self in anterior distal left leg while shaving.  Placed gauze dressing wrapped with kerlix prior to therapy - RN notified.  Patient demonstrates good return for bed mobility, but tends to lean on nearby objects for support when taking steps in room, declined to have gown tied in back secondary to wanting to hold her gown closed with left hand resulting in unsteadiness and declined to use RW.  Patient continued sitting up at bedside after therapy.    Follow Up Recommendations Home health PT    Equipment Recommendations  None recommended by PT    Recommendations for Other Services       Precautions / Restrictions Precautions Precautions: Fall Precaution Comments: history of brain injury  Restrictions Weight Bearing Restrictions: No      Mobility  Bed Mobility Overal bed mobility: Modified Independent                 Transfers Overall transfer level: Needs assistance Equipment used: None;1 person hand held assist Transfers: Sit to/from Stand;Stand Pivot Transfers Sit to Stand: Min guard Stand pivot transfers: Min guard       General transfer comment: slow labored  movement with tendency to lean on nearby objects for support  Ambulation/Gait Ambulation/Gait assistance: Min guard Gait Distance (Feet): 15 Feet Assistive device: None;1 person hand held assist Gait Pattern/deviations: Decreased step length - right;Decreased step length - left;Decreased stride length Gait velocity: slow   General Gait Details: slow unsteady cadence with tendency to lean on nearby objects for support, limited mostly due to c/o fatigue  Stairs            Wheelchair Mobility    Modified Rankin (Stroke Patients Only)       Balance Overall balance assessment: Needs assistance Sitting-balance support: Feet supported;No upper extremity supported Sitting balance-Leahy Scale: Good     Standing balance support: During functional activity;No upper extremity supported Standing balance-Leahy Scale: Fair                               Pertinent Vitals/Pain Pain Assessment: 0-10 Pain Score: 7  Pain Location: low back Pain Descriptors / Indicators: Aching;Sore;Discomfort Pain Intervention(s): Limited activity within patient's tolerance;Monitored during session    Home Living Family/patient expects to be discharged to:: Private residence Living Arrangements: Children Available Help at Discharge: Family Type of Home: Mobile home Home Access: Stairs to enter Entrance Stairs-Rails: Right;Left;Can reach both Entrance  Stairs-Number of Steps: 5-6 at front door, 2-3 steps at back door and uses back door the most Home Layout: One level Home Equipment: Cane - single point;Bedside commode;Shower seat;Hospital bed;Walker - 2 wheels;Wheelchair - manual      Prior Function Level of Independence:  Independent with assistive device(s)         Comments: household and short distanced community ambulator using [facility] PRN     Hand Dominance   Dominant Hand: Right    Extremity/Trunk Assessment   Upper Extremity Assessment Upper Extremity Assessment: Overall WFL for tasks assessed    Lower Extremity Assessment Lower Extremity Assessment: Generalized weakness    Cervical / Trunk Assessment Cervical / Trunk Assessment: Normal  Communication   Communication: No difficulties  Cognition Arousal/Alertness: Awake/alert Behavior During Therapy: WFL for tasks assessed/performed Overall Cognitive Status: Within Functional Limits for tasks assessed                                        General Comments      Exercises     Assessment/Plan    PT Assessment Patient needs continued PT services  PT Problem List Decreased strength;Decreased activity tolerance;Decreased balance;Decreased mobility       PT Treatment Interventions Gait training;Stair training;Functional mobility training;Therapeutic activities;Therapeutic exercise;Patient/family education    PT Goals (Current goals can be found in the Care Plan section)  Acute Rehab PT Goals Patient Stated Goal: return home PT Goal Formulation: With patient Time For Goal Achievement: 02/03/18 Potential to Achieve Goals: Good    Frequency Min 3X/week   Barriers to discharge        Co-evaluation               AM-PAC PT "6 Clicks" Daily Activity  Outcome Measure Difficulty turning over in bed (including adjusting bedclothes, sheets and blankets)?: None Difficulty moving from lying on back to sitting on the side of the bed? : None Difficulty sitting down on and standing up from a chair with arms (e.g., wheelchair, bedside commode, etc,.)?: None Help needed moving to and from a bed to chair (including a wheelchair)?: A Little Help needed walking in hospital room?: A Little Help needed climbing 3-5 steps  with a railing? : A Little 6 Click Score: 21    End of Session   Activity Tolerance: Patient tolerated treatment well Patient left: in bed;with call bell/phone within reach(seated at bedside) Nurse Communication: Mobility status PT Visit Diagnosis: Unsteadiness on feet (R26.81);Other abnormalities of gait and mobility (R26.89);Muscle weakness (generalized) (M62.81)    Time: 1610-9604 PT Time Calculation (min) (ACUTE ONLY): 25 min   Charges:   PT Evaluation $PT Eval Moderate Complexity: 1 Mod PT Treatments $Therapeutic Activity: 23-37 mins        3:23 PM, 01/27/18 Ocie Bob, MPT Physical Therapist with [facility] 336 [phone] office [phone] mobile phone

## 2018-01-27 NOTE — Plan of Care (Signed)
  Problem: Acute Rehab PT Goals(only PT should resolve) Goal: Patient Will Transfer Sit To/From Stand Outcome: Progressing Flowsheets (Taken 01/27/2018 1525) Patient will transfer sit to/from stand: with supervision Goal: Pt Will Transfer Bed To Chair/Chair To Bed Outcome: Progressing Flowsheets (Taken 01/27/2018 1525) Pt will Transfer Bed to Chair/Chair to Bed: with supervision Goal: Pt Will Ambulate Outcome: Progressing Flowsheets (Taken 01/27/2018 1525) Pt will Ambulate: 50 feet; with rolling walker; with cane; with supervision   3:25 PM, 01/27/18 Ocie Bob, MPT Physical Therapist with [facility] 336 [phone] office [phone] mobile phone

## 2018-01-27 NOTE — Care Management (Signed)
Admitted with AKI and found to have positive BC. Pt is from home with son. She is active with [facility] for nursing services. Has been getting IV abx for bacteremia, has not been able to follow up with provider since DC. Was scheduled to see PCP this Thursday. She only missed two doses of IV abx at home and extended her course to make up for these missed doses. CM has checked with [facility] who has no documentation of difficulties at home with IV abx therapy. Pt communicates no concerns. Plans to return home with resumption of HH services. ? Need for continued home IV abx therapy.

## 2018-01-27 NOTE — Clinical Social Work Note (Signed)
Received CSW consult for SNF placement. Reviewed pt's record this AM. RN CM following and has documented that the plan is for pt to dc home with continued IV antibiotic therapy at home. Will clear this consult.

## 2018-01-27 NOTE — Evaluation (Signed)
Clinical/Bedside Swallow Evaluation Patient Details  Name: Melanie [name] MRN: [id number] Date of Birth: 1965-09-01  Today's Date: 01/27/2018 Time: SLP Start Time (ACUTE ONLY): 1210 SLP Stop Time (ACUTE ONLY): 1238 SLP Time Calculation (min) (ACUTE ONLY): 28 min  Past Medical History:  Past Medical History:  Diagnosis Date  . Asthma   . GERD (gastroesophageal reflux disease)   . Head injury, acute    History of subdural hematoma following salicylate poisoning.  Marland Kitchen Hypertension   . Pneumonia   . Risk for falls   . Short-term memory loss   . SUBDURAL HEMATOMA 04/08/2009   Qualifier: History of  By: Melanie Bores MD, [name] Needle     Past Surgical History:  Past Surgical History:  Procedure Laterality Date  . BRAIN SURGERY    . HERNIA REPAIR    . TEE WITHOUT CARDIOVERSION N/A 01/13/2018   Procedure: TRANSESOPHAGEAL ECHOCARDIOGRAM (TEE);  Surgeon: Melanie Linden, MD;  Location: [facility];  Service: Cardiovascular;  Laterality: N/A;   HPI:  52 y/o female with multiple medical problems including history of polysubstance abuse had refused SNF placement at last admission and insisted on going home with [facility].  She presented to ED c/o severe pain and hypersomnolence.  She has an extensive psychiatric and polysubstance abuse history.  Her UDS was negative. She was home with a PICC line for IV cefazolin to treat a MSSA bacteremia from pneumonia.  She was found to be dehydrated and was admitted for IV fluids. BSE requested due to atypical PNA. Pt will likely have colonoscopy +/- EGD tomorrow.   Assessment / Plan / Recommendation Clinical Impression  Oral motor examination is WNL; Pt shows no overt signs or symptoms of aspiration during self presentation of thin liquids via cup/straw, jello, and graham crackers. Pt with mild raspy vocal quality. She reports that she has had PNA 5x in the past. She denies coughing/choking/globus sensation with swallowing, however reports and increase in eructation more  recently (and was observed belching today). She reports that she sometimes has trouble "getting large pills down". Pt appears low risk for prandial aspiration, however she may experience post prandially if reflux is significant (she denies). EGD pending for tomorrow. SLP will follow after completion to see if further assessment indicated (ie. MBSS), otherwise advance diet as per GI recommendations. SLP Visit Diagnosis: Dysphagia, unspecified (R13.10)    Aspiration Risk  No limitations    Diet Recommendation Regular;Thin liquid(advance per GI recommendations)   Liquid Administration via: Cup;Straw Medication Administration: Whole meds with liquid Supervision: Patient able to self feed Postural Changes: Seated upright at 90 degrees;Remain upright for at least 30 minutes after po intake    Other  Recommendations Oral Care Recommendations: Oral care BID Other Recommendations: Clarify dietary restrictions   Follow up Recommendations None      Frequency and Duration min 2x/week  1 week       Prognosis Prognosis for Safe Diet Advancement: Good      Swallow Study   General Date of Onset: 01/24/18 HPI: 52 y/o female with multiple medical problems including history of polysubstance abuse had refused SNF placement at last admission and insisted on going home with [facility].  She presented to ED c/o severe pain and hypersomnolence.  She has an extensive psychiatric and polysubstance abuse history.  Her UDS was negative. She was home with a PICC line for IV cefazolin to treat a MSSA bacteremia from pneumonia.  She was found to be dehydrated and was admitted for IV fluids. BSE requested  due to atypical PNA. Pt will likely have colonoscopy +/- EGD tomorrow. Type of Study: Bedside Swallow Evaluation Previous Swallow Assessment: None on record Diet Prior to this Study: (clear liquids) Temperature Spikes Noted: No Respiratory Status: Room air History of Recent Intubation: No Behavior/Cognition:  Alert;Cooperative;Pleasant mood Oral Cavity Assessment: Within Functional Limits Oral Care Completed by SLP: No Oral Cavity - Dentition: Missing dentition Vision: Functional for self-feeding Self-Feeding Abilities: Able to feed self Patient Positioning: Upright in bed(sitting on edge of bed) Baseline Vocal Quality: Normal(mild raspy/hoarse) Volitional Cough: Strong Volitional Swallow: Able to elicit    Oral/Motor/Sensory Function Overall Oral Motor/Sensory Function: Within functional limits   Ice Chips Ice chips: Within functional limits   Thin Liquid Thin Liquid: Within functional limits Presentation: Cup;Straw;Self Fed    Nectar Thick Nectar Thick Liquid: Not tested   Honey Thick Honey Thick Liquid: Not tested   Puree Puree: Not tested   Solid     Solid: Within functional limits Presentation: Self Fed     Thank you,  Melanie [name], CCC-SLP [phone]  Melanie [name] 01/27/2018,12:44 PM

## 2018-01-27 NOTE — Progress Notes (Signed)
PHARMACY - PHYSICIAN COMMUNICATION CRITICAL VALUE ALERT - BLOOD CULTURE IDENTIFICATION (BCID)  Melanie [name] is an 52 y.o. female who presented to [facility] on 01/24/2018 with a chief complaint of severe pain and hypersomnolence  Assessment: She was home with a PICC line for IV cefazolin to treat a MSSA bacteremia from pneumonia.  10/25 Bcx done and 1 of 4 bottles + CONS.  Afebrile, WBC nl.   Name of physician (or Provider) Contacted: Memom  Current antibiotics: Cefazolin 2gm IV q8h  Changes to prescribed antibiotics recommended:  Discussed with MD and felt likely contaminant since only 1 bottle positive and CONS. Continue with current recommended antibiotics - No changes needed  Results for orders placed or performed during the hospital encounter of 01/24/18  Blood Culture ID Panel (Reflexed) (Collected: 01/24/2018 12:49 PM)  Result Value Ref Range   Enterococcus species NOT DETECTED NOT DETECTED   Listeria monocytogenes NOT DETECTED NOT DETECTED   Staphylococcus species DETECTED (A) NOT DETECTED   Staphylococcus aureus (BCID) NOT DETECTED NOT DETECTED   Methicillin resistance DETECTED (A) NOT DETECTED   Streptococcus species NOT DETECTED NOT DETECTED   Streptococcus agalactiae NOT DETECTED NOT DETECTED   Streptococcus pneumoniae NOT DETECTED NOT DETECTED   Streptococcus pyogenes NOT DETECTED NOT DETECTED   Acinetobacter baumannii NOT DETECTED NOT DETECTED   Enterobacteriaceae species NOT DETECTED NOT DETECTED   Enterobacter cloacae complex NOT DETECTED NOT DETECTED   Escherichia coli NOT DETECTED NOT DETECTED   Klebsiella oxytoca NOT DETECTED NOT DETECTED   Klebsiella pneumoniae NOT DETECTED NOT DETECTED   Proteus species NOT DETECTED NOT DETECTED   Serratia marcescens NOT DETECTED NOT DETECTED   Haemophilus influenzae NOT DETECTED NOT DETECTED   Neisseria meningitidis NOT DETECTED NOT DETECTED   Pseudomonas aeruginosa NOT DETECTED NOT DETECTED   Candida albicans NOT  DETECTED NOT DETECTED   Candida glabrata NOT DETECTED NOT DETECTED   Candida krusei NOT DETECTED NOT DETECTED   Candida parapsilosis NOT DETECTED NOT DETECTED   Candida tropicalis NOT DETECTED NOT DETECTED    Tera Mater 01/27/2018  10:22 AM

## 2018-01-27 NOTE — H&P (View-Only) (Signed)
    Subjective: Continues with right flank pain, no chest pain. No overt GI bleeding. Some nausea.   Objective: Vital signs in last 24 hours: Temp:  [97.7 F (36.5 C)-98.3 F (36.8 C)] 98.3 F (36.8 C) (10/28 0554) Pulse Rate:  [86-94] 86 (10/28 0554) Resp:  [14-18] 15 (10/28 0554) BP: (105-122)/(68-79) 105/70 (10/28 0554) SpO2:  [95 %-97 %] 97 % (10/28 0554) Weight:  [57.2 kg] 57.2 kg (10/27 2312) Last BM Date: 01/26/18 General:   Alert and oriented, pleasant, appears older than stated age  Head:  Normocephalic and atraumatic. Abdomen:  Bowel sounds present, soft but with fullness upper abdomen, no rebound or guarding Msk:  Symmetrical without gross deformities. Normal posture. Extremities:  Without edema. Neurologic:  Alert and  oriented x4  Intake/Output from previous day: No intake/output data recorded. Intake/Output this shift: No intake/output data recorded.  Lab Results: Recent Labs    01/25/18 0737 01/26/18 0502 01/27/18 0640  WBC 4.3 3.7* 4.3  HGB 8.2* 7.7* 8.1*  HCT 27.0* 25.4* 26.8*  PLT 140* 129* 123*   BMET Recent Labs    01/25/18 0449 01/26/18 0502 01/27/18 0640  NA 139 135 135  K 3.7 3.7 3.7  CL 107 106 103  CO2 26 24 28   GLUCOSE 85 73 85  BUN 14 10 8   CREATININE 0.76 0.68 0.75  CALCIUM 8.0* 7.7* 7.8*   LFT Recent Labs    01/24/18 1235 01/26/18 0502  PROT 7.1 4.9*  ALBUMIN 2.9* 2.1*  AST 41 21  ALT 32 12  ALKPHOS 129* 87  BILITOT 0.9 0.6   Assessment: 52 year old female presenting with right flank pain, acute renal injury, now with worsening anemia without overt GI bleeding. Hgb 11 range on admission and steadily drifting, heme positive stools noted this admission.  No prior colonoscopy, and she reports vague history of possible PUD at least 30 years ago but is unsure if endoscopic evaluation was completed at that time. No real concerning upper or lower GI signs/symptoms, but she does report a vague epigastric bloating and discomfort  at times.    Recent prior hospitalization for pneumonia, MSSA bacteremia, with PICC line in place. Blood cultures this admission with one bottle positive but felt to likely be contaminant. Tentative end date for IV antibiotics today. She is asymptomatic, afebrile, no leukocytosis.   Anemia: Iron low normal and ferritin elevated, but could be falsely elevated in setting of acute illness. Documented heme positive stools. Will pursue colonoscopy +/- EGD on 01/28/18 with Propofol by Dr. Darrick Penna.    Plan: Clear liquids today Start golytely now, split prep NPO after midnight except meds Tap water enema X 2 in am Colonoscopy +/- EGD with Propofol on 01/28/18. Discussed with patient and stated understanding regarding risks and benefits.  Gelene Mink, PhD, ANP-BC [facility]    LOS: 3 days    01/27/2018, 12:07 PM

## 2018-01-27 NOTE — Progress Notes (Signed)
PROGRESS NOTE    Melanie [name]  XBJ:[id number]  DOB: 1965-10-23  DOA: 01/24/2018 PCP: Tanna Furry, MD   Brief Admission Hx: 52 y/o female with multiple medical problems including history of polysubstance abuse had refused SNF placement at last admission and insisted on going home with [facility].  She presented to ED c/o severe pain and hypersomnolence.  She has an extensive psychiatric and polysubstance abuse history.  Her UDS was negative. She was home with a PICC line for IV cefazolin to treat a MSSA bacteremia from pneumonia.  She was found to be dehydrated and was admitted for IV fluids.    MDM/Assessment & Plan:   1. AKI - RESOLVED. Pt was initially dehydrated but responded well to IV fluid hydration which has been cut back. Her repeat labs show improvement in renal function to normal.  Her IV fluids will be further decreased.   2. MSSA bacteremia - Pt had been on IV cefazolin to complete full course of treatment.  The END DATE for her course of IV antibiotics is 01/27/18.  At that time the staph bacteremia should be fully treated.  She does have repeated blood cultures from 10/25 that are in process but no growth to date.  3. Healthcare associated pneumonia - Pt was due to complete treatment on 10/28. CT of chest with multifocal atypical appearing right lung infection could be associated with chronic aspiration or atypical mycobacterium type of infection.  I spoke with Dr. Drue Second of ID.  Pt should have pulmonary follow up.  Because patient is not having symptoms at this time, no coughing or producing sputum wound not need to change management at this time.  4. Right flank pain - CT abdomen no acute findings.  No evidence of stone.  Pt has normalized lipase level this morning.  Monitor clinically.   5. Acute blood loss anemia - Pt has a marked decline in hemoglobin possibly from hemodilution from IV hydration.  Currently hemoglobin stable at 8.1.  Hemoccult stools are positive.  GI  following and plans are endoscopic evaluation on 10/29  6. HTN - blood pressures have been a little soft, have reduced metoprolol by 50%.  7. Lactic acidosis  - resolved with hydration.  8. Chronic low back pain - pt had MRI of L spine earlier this month with no acute findings.  9. History of polysubstance abuse - unfortunately patient is appearing more and more to have become dependent on opioids.  She was screaming out in pain on admission likely due to her having had run out of her opioid medications.  Her UDS was totally negative.  Careful with opioids.   10. Seizure disorder - she is reporting that she is not taking antiepileptics.  11. Bipolar disorder - She is currently not on treatment for this but does need psychiatric follow up arranged.   DVT prophylaxis: SCDs Code Status: Full  Family Communication: patient  Disposition Plan: Discharge home with home health services once GI work-up is complete  Antimicrobials:  Cefazolin 10/14 to end on 10/28  Doxycycline 10/26  Subjective: Complains of some chronic back pain.  No shortness of breath or cough.  Objective: Vitals:   01/26/18 2228 01/26/18 2312 01/27/18 0554 01/27/18 1436  BP: 120/78 106/68 105/70 109/69  Pulse: 94 88 86 83  Resp:  16 15 16   Temp:  98.3 F (36.8 C) 98.3 F (36.8 C) (!) 97.3 F (36.3 C)  TempSrc:  Oral Oral Oral  SpO2:  96% 97% 98%  Weight:  57.2 kg    Height:  4\' 11"  (1.499 m)      Intake/Output Summary (Last 24 hours) at 01/27/2018 1929 Last data filed at 01/27/2018 1900 Gross per 24 hour  Intake 240 ml  Output -  Net 240 ml   Filed Weights   01/25/18 0406 01/26/18 0500 01/26/18 2312  Weight: 53.3 kg 54.7 kg 57.2 kg   REVIEW OF SYSTEMS  As per history otherwise all reviewed and reported negative  Exam:  General exam: Alert, awake, oriented x 3 Respiratory system: Clear to auscultation. Respiratory effort normal. Cardiovascular system:RRR. No murmurs, rubs, gallops. Gastrointestinal  system: Abdomen is nondistended, soft and nontender. No organomegaly or masses felt. Normal bowel sounds heard. Central nervous system: Alert and oriented. No focal neurological deficits. Extremities: No C/C/E, +pedal pulses Skin: No rashes, lesions or ulcers Psychiatry: Judgement and insight appear normal. Mood & affect appropriate.    Data Reviewed: Basic Metabolic Panel: Recent Labs  Lab 01/24/18 1235 01/24/18 1325 01/25/18 0449 01/26/18 0502 01/27/18 0640  NA 138 136 139 135 135  K 4.7 4.7 3.7 3.7 3.7  CL 101 103 107 106 103  CO2 26  --  26 24 28   GLUCOSE 164* 160* 85 73 85  BUN 21* 21* 14 10 8   CREATININE 1.41* 1.40* 0.76 0.68 0.75  CALCIUM 9.1  --  8.0* 7.7* 7.8*   Liver Function Tests: Recent Labs  Lab 01/24/18 1235 01/26/18 0502  AST 41 21  ALT 32 12  ALKPHOS 129* 87  BILITOT 0.9 0.6  PROT 7.1 4.9*  ALBUMIN 2.9* 2.1*   Recent Labs  Lab 01/24/18 1235 01/25/18 0449  LIPASE 71* 48   No results for input(s): AMMONIA in the last 168 hours. CBC: Recent Labs  Lab 01/24/18 1235 01/24/18 1325 01/25/18 0737 01/26/18 0502 01/27/18 0640  WBC 11.3*  --  4.3 3.7* 4.3  NEUTROABS 9.2*  --   --   --   --   HGB 11.6* 11.9* 8.2* 7.7* 8.1*  HCT 37.9 35.0* 27.0* 25.4* 26.8*  MCV 91.3  --  91.5 93.0 92.1  PLT 254  --  140* 129* 123*   Cardiac Enzymes: No results for input(s): CKTOTAL, CKMB, CKMBINDEX, TROPONINI in the last 168 hours. CBG (last 3)  No results for input(s): GLUCAP in the last 72 hours. Recent Results (from the past 240 hour(s))  Culture, blood (Routine X 2) w Reflex to ID Panel     Status: Abnormal   Collection Time: 01/24/18 12:49 PM  Result Value Ref Range Status   Specimen Description   Final    BLOOD BLOOD RIGHT ARM PICC LINE Performed at [facility], [address], [place], [place] [address]    Special Requests   Final    BOTTLES DRAWN AEROBIC AND ANAEROBIC Blood Culture adequate volume Performed at [facility], [address],  [place], [place] [address]    Culture  Setup Time   Final    GRAM POSITIVE COCCI Gram Stain Report Called to,Read Back By and Verified With: MARTIN L. @ 1704 ON [id number] BY [place] ANAEROBIC BOTTLE ONLY    Culture (A)  Final    STAPHYLOCOCCUS SPECIES (COAGULASE NEGATIVE) THE SIGNIFICANCE OF ISOLATING THIS ORGANISM FROM A SINGLE SET OF BLOOD CULTURES WHEN MULTIPLE SETS ARE DRAWN IS UNCERTAIN. PLEASE NOTIFY THE MICROBIOLOGY DEPARTMENT WITHIN ONE WEEK IF SPECIATION AND SENSITIVITIES ARE REQUIRED. Performed at [facility], [address], [place], [place] [address]    Report  Status 01/27/2018 FINAL  Final  Blood Culture ID Panel (Reflexed)     Status: Abnormal   Collection Time: 01/24/18 12:49 PM  Result Value Ref Range Status   Enterococcus species NOT DETECTED NOT DETECTED Final   Listeria monocytogenes NOT DETECTED NOT DETECTED Final   Staphylococcus species DETECTED (A) NOT DETECTED Final    Comment: Methicillin (oxacillin) resistant coagulase negative staphylococcus. Possible blood culture contaminant (unless isolated from more than one blood culture draw or clinical case suggests pathogenicity). No antibiotic treatment is indicated for blood  culture contaminants. CRITICAL RESULT CALLED TO, READ BACK BY AND VERIFIED WITH: PHARMD [id number] 10/28 FCP    Staphylococcus aureus (BCID) NOT DETECTED NOT DETECTED Final   Methicillin resistance DETECTED (A) NOT DETECTED Final    Comment: CRITICAL RESULT CALLED TO, READ BACK BY AND VERIFIED WITH: PHARMD [id number] 10/28 FCP    Streptococcus species NOT DETECTED NOT DETECTED Final   Streptococcus agalactiae NOT DETECTED NOT DETECTED Final   Streptococcus pneumoniae NOT DETECTED NOT DETECTED Final   Streptococcus pyogenes NOT DETECTED NOT DETECTED Final   Acinetobacter baumannii NOT DETECTED NOT DETECTED Final   Enterobacteriaceae species NOT DETECTED NOT DETECTED Final   Enterobacter cloacae complex NOT DETECTED NOT DETECTED Final   Escherichia  coli NOT DETECTED NOT DETECTED Final   Klebsiella oxytoca NOT DETECTED NOT DETECTED Final   Klebsiella pneumoniae NOT DETECTED NOT DETECTED Final   Proteus species NOT DETECTED NOT DETECTED Final   Serratia marcescens NOT DETECTED NOT DETECTED Final   Haemophilus influenzae NOT DETECTED NOT DETECTED Final   Neisseria meningitidis NOT DETECTED NOT DETECTED Final   Pseudomonas aeruginosa NOT DETECTED NOT DETECTED Final   Candida albicans NOT DETECTED NOT DETECTED Final   Candida glabrata NOT DETECTED NOT DETECTED Final   Candida krusei NOT DETECTED NOT DETECTED Final   Candida parapsilosis NOT DETECTED NOT DETECTED Final   Candida tropicalis NOT DETECTED NOT DETECTED Final    Comment: Performed at [facility], [address], [place], [place] [address]  Culture, blood (Routine X 2) w Reflex to ID Panel     Status: None (Preliminary result)   Collection Time: 01/24/18  1:19 PM  Result Value Ref Range Status   Specimen Description BLOOD LEFT HAND  Final   Special Requests   Final    BOTTLES DRAWN AEROBIC ONLY Blood Culture results may not be optimal due to an inadequate volume of blood received in culture bottles   Culture   Final    NO GROWTH 3 DAYS Performed at [facility], [address], [place], [place] [address]    Report Status PENDING  Incomplete  MRSA PCR Screening     Status: None   Collection Time: 01/24/18  5:26 PM  Result Value Ref Range Status   MRSA by PCR NEGATIVE NEGATIVE Final    Comment:        The GeneXpert MRSA Assay (FDA approved for NASAL specimens only), is one component of a comprehensive MRSA colonization surveillance program. It is not intended to diagnose MRSA infection nor to guide or monitor treatment for MRSA infections. Performed at [facility], [address] [place], [place] [address]      Studies: No results found. Scheduled Meds: . bisacodyl  10 mg Oral Q2H  . Chlorhexidine Gluconate Cloth  6 each Topical Daily  . metoprolol  tartrate  12.5 mg Oral BID  . ondansetron (ZOFRAN) IV  4 mg Intravenous TID WC & HS  . [START ON 01/28/2018]  pantoprazole  40 mg Oral Daily  . polyethylene glycol  17 g Oral Daily  . polyethylene glycol  17 g Oral Q1H  . senna-docusate  1 tablet Oral BID  . sodium chloride flush  10-40 mL Intracatheter Q12H   Continuous Infusions: . sodium chloride 35 mL/hr at 01/27/18 0516  . sodium chloride    . ceFAZolin 2 g (01/27/18 1437)    Principal Problem:   AKI (acute kidney injury) (HCC) Active Problems:   BIPOLAR DISORDER UNSPECIFIED   Seizure (HCC)   Hypertension   MSSA (methicillin susceptible Staphylococcus aureus) pneumonia (HCC)   Acute right flank pain   Lactic acidosis   Right-sided low back pain without sciatica  Erick Blinks, MD [facility] Pager [phone]  If 7PM-7AM, please contact night-coverage [email or web address] Password [email or web address] 01/27/2018, 7:29 PM    LOS: 3 days

## 2018-01-27 NOTE — Progress Notes (Signed)
    Subjective: Continues with right flank pain, no chest pain. No overt GI bleeding. Some nausea.   Objective: Vital signs in last 24 hours: Temp:  [97.7 F (36.5 C)-98.3 F (36.8 C)] 98.3 F (36.8 C) (10/28 0554) Pulse Rate:  [86-94] 86 (10/28 0554) Resp:  [14-18] 15 (10/28 0554) BP: (105-122)/(68-79) 105/70 (10/28 0554) SpO2:  [95 %-97 %] 97 % (10/28 0554) Weight:  [57.2 kg] 57.2 kg (10/27 2312) Last BM Date: 01/26/18 General:   Alert and oriented, pleasant, appears older than stated age  Head:  Normocephalic and atraumatic. Abdomen:  Bowel sounds present, soft but with fullness upper abdomen, no rebound or guarding Msk:  Symmetrical without gross deformities. Normal posture. Extremities:  Without edema. Neurologic:  Alert and  oriented x4  Intake/Output from previous day: No intake/output data recorded. Intake/Output this shift: No intake/output data recorded.  Lab Results: Recent Labs    01/25/18 0737 01/26/18 0502 01/27/18 0640  WBC 4.3 3.7* 4.3  HGB 8.2* 7.7* 8.1*  HCT 27.0* 25.4* 26.8*  PLT 140* 129* 123*   BMET Recent Labs    01/25/18 0449 01/26/18 0502 01/27/18 0640  NA 139 135 135  K 3.7 3.7 3.7  CL 107 106 103  CO2 26 24 28  GLUCOSE 85 73 85  BUN 14 10 8  CREATININE 0.76 0.68 0.75  CALCIUM 8.0* 7.7* 7.8*   LFT Recent Labs    01/24/18 1235 01/26/18 0502  PROT 7.1 4.9*  ALBUMIN 2.9* 2.1*  AST 41 21  ALT 32 12  ALKPHOS 129* 87  BILITOT 0.9 0.6   Assessment: 52-year-old female presenting with right flank pain, acute renal injury, now with worsening anemia without overt GI bleeding. Hgb 11 range on admission and steadily drifting, heme positive stools noted this admission.  No prior colonoscopy, and she reports vague history of possible PUD at least 30 years ago but is unsure if endoscopic evaluation was completed at that time. No real concerning upper or lower GI signs/symptoms, but she does report a vague epigastric bloating and discomfort  at times.    Recent prior hospitalization for pneumonia, MSSA bacteremia, with PICC line in place. Blood cultures this admission with one bottle positive but felt to likely be contaminant. Tentative end date for IV antibiotics today. She is asymptomatic, afebrile, no leukocytosis.   Anemia: Iron low normal and ferritin elevated, but could be falsely elevated in setting of acute illness. Documented heme positive stools. Will pursue colonoscopy +/- EGD on 01/28/18 with Propofol by Dr. Fields.    Plan: Clear liquids today Start golytely now, split prep NPO after midnight except meds Tap water enema X 2 in am Colonoscopy +/- EGD with Propofol on 01/28/18. Discussed with patient and stated understanding regarding risks and benefits.  Adriel Desrosier W. Cinnamon Morency, PhD, ANP-BC [facility]    LOS: 3 days    01/27/2018, 12:07 PM    

## 2018-01-28 ENCOUNTER — Inpatient Hospital Stay (HOSPITAL_COMMUNITY): Payer: Medicaid Other | Admitting: Anesthesiology

## 2018-01-28 ENCOUNTER — Encounter (HOSPITAL_COMMUNITY): Payer: Self-pay | Admitting: *Deleted

## 2018-01-28 ENCOUNTER — Encounter (HOSPITAL_COMMUNITY)
Admission: EM | Disposition: A | Discharge status: Home Health | Payer: Self-pay | Source: Home / Self Care | Attending: Internal Medicine

## 2018-01-28 DIAGNOSIS — K2980 Duodenitis without bleeding: Secondary | ICD-10-CM

## 2018-01-28 DIAGNOSIS — K3189 Other diseases of stomach and duodenum: Secondary | ICD-10-CM

## 2018-01-28 HISTORY — PX: ESOPHAGOGASTRODUODENOSCOPY (EGD) WITH PROPOFOL: SHX5813

## 2018-01-28 HISTORY — PX: BIOPSY: SHX5522

## 2018-01-28 LAB — CBC
HEMATOCRIT: 26.3 % — AB (ref 36.0–46.0)
Hemoglobin: 8 g/dL — ABNORMAL LOW (ref 12.0–15.0)
MCH: 28.1 pg (ref 26.0–34.0)
MCHC: 30.4 g/dL (ref 30.0–36.0)
MCV: 92.3 fL (ref 80.0–100.0)
NRBC: 0 % (ref 0.0–0.2)
Platelets: 143 10*3/uL — ABNORMAL LOW (ref 150–400)
RBC: 2.85 MIL/uL — AB (ref 3.87–5.11)
RDW: 14.2 % (ref 11.5–15.5)
WBC: 4.7 10*3/uL (ref 4.0–10.5)

## 2018-01-28 SURGERY — ESOPHAGOGASTRODUODENOSCOPY (EGD) WITH PROPOFOL
Anesthesia: General

## 2018-01-28 MED ORDER — MIDAZOLAM HCL 2 MG/2ML IJ SOLN: 0.5000 mg | Freq: Once | INTRAMUSCULAR | Status: DC | PRN

## 2018-01-28 MED ORDER — LACTATED RINGERS IV SOLN
INTRAVENOUS | Status: DC
  Administered 2018-01-28: 14:00:00 via INTRAVENOUS

## 2018-01-28 MED ORDER — LIDOCAINE VISCOUS HCL 2 % MT SOLN
OROMUCOSAL | Status: DC | PRN
  Administered 2018-01-28: 5 mL via OROMUCOSAL

## 2018-01-28 MED ORDER — HYDROCODONE-ACETAMINOPHEN 7.5-325 MG PO TABS: 1.0000 | ORAL_TABLET | Freq: Once | ORAL | Status: DC | PRN

## 2018-01-28 MED ORDER — PROPOFOL 10 MG/ML IV BOLUS
INTRAVENOUS | Status: AC
  Filled 2018-01-28: qty 20

## 2018-01-28 MED ORDER — PROPOFOL 500 MG/50ML IV EMUL
INTRAVENOUS | Status: DC | PRN
  Administered 2018-01-28: 125 ug/kg/min via INTRAVENOUS

## 2018-01-28 MED ORDER — NICOTINE 21 MG/24HR TD PT24
21.0000 mg | MEDICATED_PATCH | Freq: Every day | TRANSDERMAL | Status: DC
  Administered 2018-01-28 – 2018-01-29 (×2): 21 mg via TRANSDERMAL
  Filled 2018-01-28 (×2): qty 1

## 2018-01-28 MED ORDER — KETAMINE HCL 10 MG/ML IJ SOLN
INTRAMUSCULAR | Status: DC | PRN
  Administered 2018-01-28: 10 mg via INTRAVENOUS

## 2018-01-28 MED ORDER — PROMETHAZINE HCL 25 MG/ML IJ SOLN: 6.2500 mg | INTRAMUSCULAR | Status: DC | PRN

## 2018-01-28 MED ORDER — HYDROMORPHONE HCL 1 MG/ML IJ SOLN: 0.2500 mg | INTRAMUSCULAR | Status: DC | PRN

## 2018-01-28 NOTE — Anesthesia Preprocedure Evaluation (Signed)
Anesthesia Evaluation  Patient identified by MRN, date of birth, ID band Patient awake    Reviewed: Allergy & Precautions, NPO status , Patient's Chart, lab work & pertinent test results  Airway Mallampati: II  TM Distance: >3 FB Neck ROM: Full    Dental no notable dental hx. (+) Teeth Intact   Pulmonary asthma , pneumonia, Current Smoker,    Pulmonary exam normal breath sounds clear to auscultation       Cardiovascular Exercise Tolerance: Good hypertension, negative cardio ROS Normal cardiovascular examI Rhythm:Regular Rate:Normal     Neuro/Psych Seizures -,  Bipolar Disorder negative psych ROS   GI/Hepatic Neg liver ROS, GERD  Medicated and Controlled,Denies GERD SX today   Endo/Other  negative endocrine ROS  Renal/GU Renal InsufficiencyRenal disease  negative genitourinary   Musculoskeletal negative musculoskeletal ROS (+)   Abdominal   Peds negative pediatric ROS (+)  Hematology negative hematology ROS (+) anemia ,   Anesthesia Other Findings   Reproductive/Obstetrics negative OB ROS                             Anesthesia Physical Anesthesia Plan  ASA: II  Anesthesia Plan: General   Post-op Pain Management:    Induction: Intravenous  PONV Risk Score and Plan:   Airway Management Planned: Nasal Cannula and Simple Face Mask  Additional Equipment:   Intra-op Plan:   Post-operative Plan:   Informed Consent: I have reviewed the patients History and Physical, chart, labs and discussed the procedure including the risks, benefits and alternatives for the proposed anesthesia with the patient or authorized representative who has indicated his/her understanding and acceptance.   Dental advisory given  Plan Discussed with: CRNA  Anesthesia Plan Comments:         Anesthesia Quick Evaluation

## 2018-01-28 NOTE — Progress Notes (Signed)
Patient completed Miralax prep as ordered. 2 tap water enemas already this morning. Per nursing staff, no solid pieces, still brown but clear. Will order additional tap water enema now. Patient is NPO except meds for colonoscopy +/- EGD today with Dr. Darrick Penna. Aware of risks and benefits and desires to proceed. Gelene Mink, PhD, ANP-BC [facility]

## 2018-01-28 NOTE — Care Management (Addendum)
Pt will DC home after procedure today with resumption of [facility]. Pt aware HH has 48 hrs to make resumption visit. Bonita Quin, [facility] rep, aware of DC possibility later today. Per MD pt's IV abx therapy complete and pt's PICC will be removed prior to DC.

## 2018-01-28 NOTE — Interval H&P Note (Signed)
History and Physical Interval Note:  01/28/2018 2:11 PM  Derrill Memo  has presented today for surgery, with the diagnosis of anemia, heme positive stool  The various methods of treatment have been discussed with the patient and family. After consideration of risks, benefits and other options for treatment, the patient has consented to  Procedure(s): COLONOSCOPY WITH PROPOFOL (N/A) ESOPHAGOGASTRODUODENOSCOPY (EGD) WITH PROPOFOL (N/A) as a surgical intervention .  The patient's history has been reviewed, patient examined, no change in status, stable for surgery.  I have reviewed the patient's chart and labs.  Questions were answered to the patient's satisfaction.     [employer]

## 2018-01-28 NOTE — Transfer of Care (Signed)
Immediate Anesthesia Transfer of Care Note  Patient: Emmalea Treanor  Procedure(s) Performed: ESOPHAGOGASTRODUODENOSCOPY (EGD) WITH PROPOFOL (N/A ) BIOPSY  Patient Location: PACU  Anesthesia Type:General  Level of Consciousness: awake and patient cooperative  Airway & Oxygen Therapy: Patient Spontanous Breathing  Post-op Assessment: Report given to RN, Post -op Vital signs reviewed and stable and Patient moving all extremities  Post vital signs: Reviewed and stable  Last Vitals:  Vitals Value Taken Time  BP    Temp    Pulse 25 01/28/2018  3:14 PM  Resp    SpO2 80 % 01/28/2018  3:14 PM  Vitals shown include unvalidated device data.  Last Pain:  Vitals:   01/28/18 1454  TempSrc:   PainSc: 0-No pain      Patients Stated Pain Goal: 6 (01/28/18 1410)  Complications: No apparent anesthesia complications

## 2018-01-28 NOTE — Progress Notes (Signed)
Completed tap water enemas x2. Patient tolerated well.

## 2018-01-28 NOTE — Anesthesia Postprocedure Evaluation (Signed)
Anesthesia Post Note  Patient: Melanie [name]  Procedure(s) Performed: ESOPHAGOGASTRODUODENOSCOPY (EGD) WITH PROPOFOL (N/A ) BIOPSY  Patient location during evaluation: PACU Anesthesia Type: General Level of consciousness: awake and patient cooperative Pain management: pain level controlled Vital Signs Assessment: post-procedure vital signs reviewed and stable Respiratory status: spontaneous breathing, nonlabored ventilation and respiratory function stable Cardiovascular status: blood pressure returned to baseline Postop Assessment: no apparent nausea or vomiting Anesthetic complications: no     Last Vitals:  Vitals:   01/28/18 1410 01/28/18 1515  BP: 114/63 119/69  Pulse: 81 (!) 25  Resp: 16 10  Temp: 36.4 C (P) 36.5 C  SpO2: 98%     Last Pain:  Vitals:   01/28/18 1454  TempSrc:   PainSc: 0-No pain                 Tyneka Scafidi J

## 2018-01-28 NOTE — Progress Notes (Signed)
PROGRESS NOTE    Melanie [name]  ZOX:[id number]  DOB: [birth year]  DOA: 01/24/2018 PCP: Tanna Furry, MD   Brief Admission Hx: 52 y/o female with multiple medical problems including history of polysubstance abuse had refused SNF placement at last admission and insisted on going home with [facility].  She presented to ED c/o severe pain and hypersomnolence.  She has an extensive psychiatric and polysubstance abuse history.  Her UDS was negative. She was home with a PICC line for IV cefazolin to treat a MSSA bacteremia from pneumonia.  She was found to be dehydrated and was admitted for IV fluids.    MDM/Assessment & Plan:   1. AKI - RESOLVED. Pt was initially dehydrated but responded well to IV fluid hydration which has been cut back. Her repeat labs show improvement in renal function to normal.  Her IV fluids will be further decreased.   2. MSSA bacteremia - Pt had been on IV cefazolin to complete full course of treatment.  The END DATE for her course of IV antibiotics is 01/27/18.  At that time the staph bacteremia should be fully treated.  She does have repeated blood cultures from 10/25 that are in process but no growth to date.  3. Healthcare associated pneumonia - Pt was due to complete treatment on 10/28. CT of chest with multifocal atypical appearing right lung infection could be associated with chronic aspiration or atypical mycobacterium type of infection.  I spoke with Dr. Drue Second of ID.  Pt should have pulmonary follow up.  Because patient is not having symptoms at this time, no coughing or producing sputum wound not need to change management at this time.  4. Right flank pain - CT abdomen no acute findings.  No evidence of stone.  Pt has normalized lipase level this morning.  Monitor clinically.   5. Acute blood loss anemia - Pt has a marked decline in hemoglobin possibly from hemodilution from IV hydration.  Currently hemoglobin stable at 8.1.  Hemoccult stools are positive.  GI  following and plans are endoscopic evaluation today 6. HTN - blood pressures have been a little soft, have reduced metoprolol by 50%.  7. Lactic acidosis  - resolved with hydration.  8. Chronic low back pain - pt had MRI of L spine earlier this month with no acute findings.  9. History of polysubstance abuse - unfortunately patient is appearing more and more to have become dependent on opioids.  She was screaming out in pain on admission likely due to her having had run out of her opioid medications.  Her UDS was totally negative.  Careful with opioids.   10. Seizure disorder - she is reporting that she is not taking antiepileptics.  11. Bipolar disorder - She is currently not on treatment for this but does need psychiatric follow up arranged.   DVT prophylaxis: SCDs Code Status: Full  Family Communication: patient  Disposition Plan: Discharge home with home health services once GI work-up is complete  Antimicrobials:  Cefazolin 10/14 to end on 10/28  Doxycycline 10/26  Subjective: She says she is having difficult time drinking prep.  She did have a bowel movement yesterday.  Plans are for colonoscopy later today.  Denies any shortness of breath.  Objective: Vitals:   01/28/18 1515 01/28/18 1530 01/28/18 1545 01/28/18 1600  BP: 119/69 123/82 116/81 130/87  Pulse: (!) 25 79 88 89  Resp: 10 11 (!) 26   Temp: 97.7 F (36.5 C)   97.8 F (36.6 C)  TempSrc:      SpO2: 97% 97% 94% 100%  Weight:      Height:        Intake/Output Summary (Last 24 hours) at 01/28/2018 1938 Last data filed at 01/28/2018 1451 Gross per 24 hour  Intake 760 ml  Output -  Net 760 ml   Filed Weights   01/25/18 0406 01/26/18 0500 01/26/18 2312  Weight: 53.3 kg 54.7 kg 57.2 kg   REVIEW OF SYSTEMS  As per history otherwise all reviewed and reported negative  Exam:  General exam: Alert, awake, oriented x 3 Respiratory system: Clear to auscultation. Respiratory effort normal. Cardiovascular  system:RRR. No murmurs, rubs, gallops. Gastrointestinal system: Abdomen is nondistended, soft and nontender. No organomegaly or masses felt. Normal bowel sounds heard. Central nervous system: Alert and oriented. No focal neurological deficits. Extremities: No C/C/E, +pedal pulses Skin: No rashes, lesions or ulcers Psychiatry: Judgement and insight appear normal. Mood & affect appropriate.     Data Reviewed: Basic Metabolic Panel: Recent Labs  Lab 01/24/18 1235 01/24/18 1325 01/25/18 0449 01/26/18 0502 01/27/18 0640  NA 138 136 139 135 135  K 4.7 4.7 3.7 3.7 3.7  CL 101 103 107 106 103  CO2 26  --  26 24 28   GLUCOSE 164* 160* 85 73 85  BUN 21* 21* 14 10 8   CREATININE 1.41* 1.40* 0.76 0.68 0.75  CALCIUM 9.1  --  8.0* 7.7* 7.8*   Liver Function Tests: Recent Labs  Lab 01/24/18 1235 01/26/18 0502  AST 41 21  ALT 32 12  ALKPHOS 129* 87  BILITOT 0.9 0.6  PROT 7.1 4.9*  ALBUMIN 2.9* 2.1*   Recent Labs  Lab 01/24/18 1235 01/25/18 0449  LIPASE 71* 48   No results for input(s): AMMONIA in the last 168 hours. CBC: Recent Labs  Lab 01/24/18 1235 01/24/18 1325 01/25/18 0737 01/26/18 0502 01/27/18 0640 01/28/18 0547  WBC 11.3*  --  4.3 3.7* 4.3 4.7  NEUTROABS 9.2*  --   --   --   --   --   HGB 11.6* 11.9* 8.2* 7.7* 8.1* 8.0*  HCT 37.9 35.0* 27.0* 25.4* 26.8* 26.3*  MCV 91.3  --  91.5 93.0 92.1 92.3  PLT 254  --  140* 129* 123* 143*   Cardiac Enzymes: No results for input(s): CKTOTAL, CKMB, CKMBINDEX, TROPONINI in the last 168 hours. CBG (last 3)  No results for input(s): GLUCAP in the last 72 hours. Recent Results (from the past 240 hour(s))  Culture, blood (Routine X 2) w Reflex to ID Panel     Status: Abnormal   Collection Time: 01/24/18 12:49 PM  Result Value Ref Range Status   Specimen Description   Final    BLOOD BLOOD RIGHT ARM PICC LINE Performed at [facility], [address], [place], [place] [address]    Special Requests   Final    BOTTLES DRAWN  AEROBIC AND ANAEROBIC Blood Culture adequate volume Performed at [facility], [address], [place], [place] [address]    Culture  Setup Time   Final    GRAM POSITIVE COCCI Gram Stain Report Called to,Read Back By and Verified With: MARTIN L. @ 1704 ON [id number] BY [place] ANAEROBIC BOTTLE ONLY    Culture (A)  Final    STAPHYLOCOCCUS SPECIES (COAGULASE NEGATIVE) THE SIGNIFICANCE OF ISOLATING THIS ORGANISM FROM A SINGLE SET OF BLOOD CULTURES WHEN MULTIPLE SETS ARE DRAWN IS UNCERTAIN. PLEASE NOTIFY THE MICROBIOLOGY DEPARTMENT WITHIN ONE WEEK IF SPECIATION AND SENSITIVITIES  ARE REQUIRED. Performed at [facility], [address] [place], [place] [address]    Report Status 01/27/2018 FINAL  Final  Blood Culture ID Panel (Reflexed)     Status: Abnormal   Collection Time: 01/24/18 12:49 PM  Result Value Ref Range Status   Enterococcus species NOT DETECTED NOT DETECTED Final   Listeria monocytogenes NOT DETECTED NOT DETECTED Final   Staphylococcus species DETECTED (A) NOT DETECTED Final    Comment: Methicillin (oxacillin) resistant coagulase negative staphylococcus. Possible blood culture contaminant (unless isolated from more than one blood culture draw or clinical case suggests pathogenicity). No antibiotic treatment is indicated for blood  culture contaminants. CRITICAL RESULT CALLED TO, READ BACK BY AND VERIFIED WITH: PHARMD [id number] 10/28 FCP    Staphylococcus aureus (BCID) NOT DETECTED NOT DETECTED Final   Methicillin resistance DETECTED (A) NOT DETECTED Final    Comment: CRITICAL RESULT CALLED TO, READ BACK BY AND VERIFIED WITH: PHARMD [id number] 10/28 [facility]    Streptococcus species NOT DETECTED NOT DETECTED Final   Streptococcus agalactiae NOT DETECTED NOT DETECTED Final   Streptococcus pneumoniae NOT DETECTED NOT DETECTED Final   Streptococcus pyogenes NOT DETECTED NOT DETECTED Final   Acinetobacter baumannii NOT DETECTED NOT DETECTED Final   Enterobacteriaceae species NOT DETECTED  NOT DETECTED Final   Enterobacter cloacae complex NOT DETECTED NOT DETECTED Final   Escherichia coli NOT DETECTED NOT DETECTED Final   Klebsiella oxytoca NOT DETECTED NOT DETECTED Final   Klebsiella pneumoniae NOT DETECTED NOT DETECTED Final   Proteus species NOT DETECTED NOT DETECTED Final   Serratia marcescens NOT DETECTED NOT DETECTED Final   Haemophilus influenzae NOT DETECTED NOT DETECTED Final   Neisseria meningitidis NOT DETECTED NOT DETECTED Final   Pseudomonas aeruginosa NOT DETECTED NOT DETECTED Final   Candida albicans NOT DETECTED NOT DETECTED Final   Candida glabrata NOT DETECTED NOT DETECTED Final   Candida krusei NOT DETECTED NOT DETECTED Final   Candida parapsilosis NOT DETECTED NOT DETECTED Final   Candida tropicalis NOT DETECTED NOT DETECTED Final    Comment: Performed at [facility], [address] [place], [place] [address]  Culture, blood (Routine X 2) w Reflex to ID Panel     Status: None (Preliminary result)   Collection Time: 01/24/18  1:19 PM  Result Value Ref Range Status   Specimen Description BLOOD LEFT HAND  Final   Special Requests   Final    BOTTLES DRAWN AEROBIC ONLY Blood Culture results may not be optimal due to an inadequate volume of blood received in culture bottles   Culture   Final    NO GROWTH 4 DAYS Performed at [facility], [address], [place], [place] [address]    Report Status PENDING  Incomplete  MRSA PCR Screening     Status: None   Collection Time: 01/24/18  5:26 PM  Result Value Ref Range Status   MRSA by PCR NEGATIVE NEGATIVE Final    Comment:        The GeneXpert MRSA Assay (FDA approved for NASAL specimens only), is one component of a comprehensive MRSA colonization surveillance program. It is not intended to diagnose MRSA infection nor to guide or monitor treatment for MRSA infections. Performed at [facility], [address] [place], [place] [address]      Studies: No results found. Scheduled Meds: .  Chlorhexidine Gluconate Cloth  6 each Topical Daily  . metoprolol tartrate  12.5 mg Oral BID  . ondansetron (ZOFRAN) IV  4  mg Intravenous TID WC & HS  . pantoprazole  40 mg Oral Daily  . polyethylene glycol  17 g Oral Daily  . senna-docusate  1 tablet Oral BID  . sodium chloride flush  10-40 mL Intracatheter Q12H   Continuous Infusions: . sodium chloride 35 mL/hr at 01/28/18 0800    Principal Problem:   AKI (acute kidney injury) (HCC) Active Problems:   BIPOLAR DISORDER UNSPECIFIED   Seizure (HCC)   Hypertension   MSSA (methicillin susceptible Staphylococcus aureus) pneumonia (HCC)   Acute right flank pain   Lactic acidosis   Right-sided low back pain without sciatica  Erick Blinks, MD [facility] Pager [phone]  If 7PM-7AM, please contact night-coverage [email or web address] Password [email or web address] 01/28/2018, 7:38 PM    LOS: 4 days

## 2018-01-28 NOTE — Op Note (Signed)
[facility] Patient Name: Melanie [name] Procedure Date: 01/28/2018 2:57 PM MRN: [id number] Date of Birth: April 30, 1965 Attending MD: Jonette Eva MD, MD CSN: [id number] Age: 52 Admit Type: Inpatient Procedure:                Upper GI endoscopy WITH COLD FORCEPS BIOPSY Indications:              Anemia-NORMOCYTICC PT DECLINES TCS. CANNOT TOLERATE                            PEG PREP(NULYTE OR MIRALAX PREP). Providers:                Jonette Eva MD, MD, Criselda Peaches. Patsy Lager, RN, Edythe Clarity, Technician Referring MD:             Sullivan Lone, MD Medicines:                Propofol per Anesthesia Complications:            No immediate complications. Estimated Blood Loss:     Estimated blood loss was minimal. Procedure:                Pre-Anesthesia Assessment:                           - Prior to the procedure, a History and Physical                            was performed, and patient medications and                            allergies were reviewed. The patient's tolerance of                            previous anesthesia was also reviewed. The risks                            and benefits of the procedure and the sedation                            options and risks were discussed with the patient.                            All questions were answered, and informed consent                            was obtained. Prior Anticoagulants: The patient has                            taken no previous anticoagulant or antiplatelet                            agents. ASA Grade Assessment: II - A patient with  mild systemic disease. After reviewing the risks                            and benefits, the patient was deemed in                            satisfactory condition to undergo the procedure.                            After obtaining informed consent, the endoscope was                            passed under direct vision.  Throughout the                            procedure, the patient's blood pressure, pulse, and                            oxygen saturations were monitored continuously. The                            [id number]-[id number] ([id number]) was introduced through the                            mouth, and advanced to the second part of duodenum.                            The upper GI endoscopy was accomplished without                            difficulty. The patient tolerated the procedure                            well. Scope In: 2:59:32 PM Scope Out: 3:08:06 PM Total Procedure Duration: 0 hours 8 minutes 34 seconds  Findings:      The examined esophagus was normal.      Patchy mild inflammation characterized by congestion (edema) and       erythema was found in the gastric antrum. Biopsies(2: BODY, 3 ANTRUM)       were taken with a cold forceps for Helicobacter pylori testing.      Patchy mild inflammation characterized by congestion (edema) and       erythema was found in the duodenal bulb. Biopsies(2) for histology were       taken with a cold forceps for evaluation of celiac disease.      The second portion of the duodenum was normal. Biopsies(4) for histology       were taken with a cold forceps for evaluation of celiac disease. Impression:               - Normal esophagus.                           - Gastritis/Duodenitis CONTRIBUTING TO HEME  POSITIVE STOOLS BUT NOT NORMOCYTIC ANEMIA. Moderate Sedation:      Per Anesthesia Care Recommendation:           - Return patient to hospital ward for ongoing care.                           - Soft diet and low fat diet.                           - Continue present medications.                           - Await pathology results.                           - Return to my office in 3 months. Procedure Code(s):        --- Professional ---                           [phone], Esophagogastroduodenoscopy, flexible,                             transoral; with biopsy, single or multiple Diagnosis Code(s):        --- Professional ---                           K29.70, Gastritis, unspecified, without bleeding                           K29.80, Duodenitis without bleeding                           D64.9, Anemia, unspecified CPT copyright 2018 [employer] Medical Association. All rights reserved. The codes documented in this report are preliminary and upon coder review may  be revised to meet current compliance requirements. Jonette Eva, MD Jonette Eva MD, MD 01/28/2018 3:22:00 PM This report has been signed electronically. Number of Addenda: 0

## 2018-01-28 NOTE — Progress Notes (Signed)
SLP Cancellation Note  Patient Details Name: Maleeka Sabatino MRN: [id number] DOB: 16-Sep-1965   Cancelled treatment:       Reason Eval/Treat Not Completed: Patient at procedure or test/unavailable; Pt NPO pending GI procedure today. SLP will follow up as schedule permits.  Thank you,  [name], CCC-SLP [phone]    Shaday Rayborn 01/28/2018, 8:53 AM

## 2018-01-28 NOTE — Progress Notes (Addendum)
Two tap water enemas just completed.   Not clear,small fragments of stool with tan water. States she will not do more enemas

## 2018-01-29 ENCOUNTER — Encounter: Payer: Self-pay | Admitting: Gastroenterology

## 2018-01-29 ENCOUNTER — Telehealth: Payer: Self-pay | Admitting: Gastroenterology

## 2018-01-29 DIAGNOSIS — K2990 Gastroduodenitis, unspecified, without bleeding: Secondary | ICD-10-CM

## 2018-01-29 DIAGNOSIS — R195 Other fecal abnormalities: Secondary | ICD-10-CM

## 2018-01-29 DIAGNOSIS — K2970 Gastritis, unspecified, without bleeding: Secondary | ICD-10-CM

## 2018-01-29 LAB — CBC
HEMATOCRIT: 26.2 % — AB (ref 36.0–46.0)
HEMOGLOBIN: 8 g/dL — AB (ref 12.0–15.0)
MCH: 28.8 pg (ref 26.0–34.0)
MCHC: 30.5 g/dL (ref 30.0–36.0)
MCV: 94.2 fL (ref 80.0–100.0)
Platelets: 151 10*3/uL (ref 150–400)
RBC: 2.78 MIL/uL — AB (ref 3.87–5.11)
RDW: 14.6 % (ref 11.5–15.5)
WBC: 3.5 10*3/uL — ABNORMAL LOW (ref 4.0–10.5)
nRBC: 0 % (ref 0.0–0.2)

## 2018-01-29 LAB — CULTURE, BLOOD (ROUTINE X 2): Culture: NO GROWTH

## 2018-01-29 MED ORDER — METOPROLOL TARTRATE 25 MG PO TABS: 12.5000 mg | ORAL_TABLET | Freq: Two times a day (BID) | ORAL | 0 refills | Status: AC

## 2018-01-29 MED ORDER — ONDANSETRON 4 MG PO TBDP: 4.0000 mg | ORAL_TABLET | Freq: Three times a day (TID) | ORAL | 0 refills | Status: AC | PRN

## 2018-01-29 MED ORDER — SENNOSIDES-DOCUSATE SODIUM 8.6-50 MG PO TABS: 1.0000 | ORAL_TABLET | Freq: Two times a day (BID) | ORAL | 0 refills | Status: DC

## 2018-01-29 MED ORDER — POLYETHYLENE GLYCOL 3350 17 G PO PACK: 17.0000 g | PACK | Freq: Every day | ORAL | 0 refills | Status: DC

## 2018-01-29 MED ORDER — PANTOPRAZOLE SODIUM 40 MG PO TBEC: 40.0000 mg | DELAYED_RELEASE_TABLET | Freq: Every day | ORAL | 1 refills | Status: DC

## 2018-01-29 MED ORDER — PROMETHAZINE HCL 12.5 MG PO TABS: 12.5000 mg | ORAL_TABLET | Freq: Four times a day (QID) | ORAL | 0 refills | Status: DC | PRN

## 2018-01-29 NOTE — Telephone Encounter (Signed)
PATIENT SCHEDULED AND LETTER SENT  °

## 2018-01-29 NOTE — Discharge Summary (Signed)
Physician Discharge Summary  Melanie [name] ZOX:[id number] DOB: 01-23-1966 DOA: 01/24/2018  PCP: Tanna Furry, MD  Admit date: 01/24/2018 Discharge date: 01/29/2018  Admitted From: Home Disposition: Home  Recommendations for Outpatient Follow-up:  1. Follow up with PCP in 1-2 weeks 2. Please obtain BMP/CBC in one week 3. Follow-up with GI in 2 months 4. For further work-up for normocytic anemia can be done as an outpatient 5. Patient will need referral to pulmonologist to evaluate CT chest findings.  Discharge Condition: Stable CODE STATUS: Full code Diet recommendation: Heart healthy  Brief/Interim Summary: 52 y/o female with multiple medical problems including history of polysubstance abuse had refused SNF placement at last admission and insisted on going home with [facility].  She presented to ED c/o severe pain and hypersomnolence.  She has an extensive psychiatric and polysubstance abuse history.  Her UDS was negative. She was home with a PICC line for IV cefazolin to treat a MSSA bacteremia from pneumonia.  She was found to be dehydrated and was admitted for IV fluids.    Discharge Diagnoses:  Principal Problem:   AKI (acute kidney injury) (HCC) Active Problems:   BIPOLAR DISORDER UNSPECIFIED   Seizure (HCC)   Hypertension   MSSA (methicillin susceptible Staphylococcus aureus) pneumonia (HCC)   Acute right flank pain   Lactic acidosis   Right-sided low back pain without sciatica   Heme positive stool   Gastritis and gastroduodenitis  1. Acute kidney injury.  Resolved.  Secondary to dehydration.  Improved with IV fluids. 2. MSSA bacteremia.  Recent admission, she was found to have a positive blood culture for MSSA.  She was discharged home with PICC line and was treated with Ancef.  Her course was completed on 10/28.  Repeat cultures were drawn on this readmission when 1 of them turn positive for coagulase-negative staph.  This was felt to be contaminant.  After  discussing with infectious disease, Dr. Orvan Falconer, it was felt best to discontinue further antibiotics and remove her PICC line prior to discharge. 3. Healthcare associated pneumonia.  CT of the chest with multifocal atypical appearing right lung infection could be associate with chronic aspiration or atypical Mycobacterium type infection.  Case was discussed by Dr. Laural Benes with Dr. Ilsa Iha of infectious disease.  It was recommended that since patient is not having any symptoms at this time, no coughing or producing sputum, it would be best just to have her follow-up with pulmonology as an outpatient. 4. Right flank pain.  CT abdomen did not show any acute findings.  She also had recent MRI of her lumbar spine done that did not show any acute findings. 5. Acute blood loss anemia.  Patient had a decline in hemoglobin, possibly related to hemodilution.  Her hemoglobin is stable around 8.1.  Hemoccult stools were positive.  She was seen by GI and underwent endoscopy which showed gastritis/duodenitis.  Unfortunately, she could not undergo colonoscopy since she did not have adequate prep.  Since her hemoglobin is stable, this can be pursued as an outpatient. 6. Remainder of medical problems remained stable.  Discharge Instructions  Discharge Instructions    Diet - low sodium heart healthy   Complete by:  As directed    Increase activity slowly   Complete by:  As directed      Allergies as of 01/29/2018   No Known Allergies     Medication List    TAKE these medications   albuterol 108 (90 Base) MCG/ACT inhaler Commonly known as:  PROVENTIL  HFA;VENTOLIN HFA Inhale 2 puffs into the lungs every 4 (four) hours as needed for wheezing or shortness of breath (or coughing).   metoprolol tartrate 25 MG tablet Commonly known as:  LOPRESSOR Take 0.5 tablets (12.5 mg total) by mouth 2 (two) times daily. What changed:  how much to take   ondansetron 4 MG disintegrating tablet Commonly known as:   ZOFRAN-ODT Take 1 tablet (4 mg total) by mouth every 8 (eight) hours as needed for nausea or vomiting.   pantoprazole 40 MG tablet Commonly known as:  PROTONIX Take 1 tablet (40 mg total) by mouth daily.   polyethylene glycol packet Commonly known as:  MIRALAX / GLYCOLAX Take 17 g by mouth daily. Start taking on:  01/30/2018   senna-docusate 8.6-50 MG tablet Commonly known as:  Senokot-S Take 1 tablet by mouth 2 (two) times daily.       No Known Allergies  Consultations:  Gastroenterology   Procedures/Studies: Dg Chest 2 View  Result Date: 01/03/2018 CLINICAL DATA:  Cough congestion and wheezing.  Shortness of breath. EXAM: CHEST - 2 VIEW COMPARISON:  Chest radiograph 05/13/2017, chest CT 04/17/2017 FINDINGS: Cardiomediastinal silhouette is normal. Mediastinal contours appear intact. There is no evidence of pleural effusion or pneumothorax. Interval development of patchy and nodular airspace consolidation throughout the right lung, more confluent in the upper lobe. Scattered areas of airspace consolidation in the left lower hemithorax. Osseous structures are without acute abnormality. Soft tissues are grossly normal. IMPRESSION: Interval development of patchy and nodular airspace consolidation throughout the right lung, more confluent in the upper lobe. More subtle areas of airspace consolidation the left lower thorax. In the acute clinical settings, this likely represents multifocal pneumonia, however follow-up to resolution after empiric treatment should be considered. Electronically Signed   By: Ted Mcalpine M.D.   On: 01/03/2018 14:42   Ct Head Wo Contrast  Result Date: 01/03/2018 CLINICAL DATA:  Altered level of consciousness, fell 2 days ago, abrasions to face, history hypertension and smoking, history subdural hematoma and surgery EXAM: CT HEAD WITHOUT CONTRAST TECHNIQUE: Contiguous axial images were obtained from the base of the skull through the vertex without  intravenous contrast. Sagittal and coronal MPR images reconstructed from axial data set. COMPARISON:  01/02/2012 FINDINGS: Brain: Normal ventricular morphology. No midline shift or mass effect. 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 Otherwise normal appearance of brain parenchyma. No intracranial hemorrhage, mass lesion or evidence of acute infarction. No extra-axial fluid collections. Vascular: Unremarkable Skull: Prior LEFT frontotemporal parietal craniotomy. No acute osseous findings. 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 Prior LEFT craniotomy. No acute intracranial abnormalities. Electronically Signed   By: Ulyses Southward M.D.   On: 01/03/2018 14:32   Ct Chest W Contrast  Result Date: 01/24/2018 CLINICAL DATA:  Pneumonia with chest pain EXAM: CT CHEST WITH CONTRAST TECHNIQUE: Multidetector CT imaging of the chest was performed during intravenous contrast administration. CONTRAST:  60mL ISOVUE-300 IOPAMIDOL (ISOVUE-300) INJECTION 61% COMPARISON:  Chest CTA 04/17/2017 Chest radiograph 01/24/2018 FINDINGS: Cardiovascular: No significant vascular findings. Normal heart size. No pericardial effusion. Calcific aortic atherosclerosis. Mediastinum/Nodes: No enlarged mediastinal, hilar, or axillary lymph nodes. Thyroid gland, trachea, and esophagus demonstrate no significant findings. Lungs/Pleura: There is multifocal consolidation within the right lung, greatest in the right upper lobe. The sites are associated with extensive cystic change. The left lung is clear. No pleural effusion or pneumothorax. Upper Abdomen: No acute abnormality. Musculoskeletal: No chest wall abnormality. No  acute or significant osseous findings. IMPRESSION: 1. Multifocal cystic consolidation within the right lung, involving all lobes, with the largest area located in the right upper lobe. This may be secondary to chronic aspiration or atypical  infection, such as non-tuberculous mycobacterium. 2.  Aortic Atherosclerosis (ICD10-I70.0). Electronically Signed   By: Deatra Robinson M.D.   On: 01/24/2018 22:08   Mr Lumbar Spine Wo Contrast  Result Date: 01/09/2018 CLINICAL DATA:  Recent fall, low back pain for 2 months EXAM: MRI LUMBAR SPINE WITHOUT CONTRAST TECHNIQUE: Multiplanar, multisequence MR imaging of the lumbar spine was performed. No intravenous contrast was administered. COMPARISON:  None. FINDINGS: Segmentation:  Standard. Alignment:  Physiologic. Vertebrae:  No fracture, evidence of discitis, or bone lesion. Conus medullaris and cauda equina: Conus extends to the T12-L1 level. Conus and cauda equina appear normal. Paraspinal and other soft tissues: No acute paraspinal abnormality. Soft tissue edema in the subcutaneous fat along the posterior aspect of the back. Disc levels: Disc spaces: Disc spaces are maintained. T12-L1: No significant disc bulge. No evidence of neural foraminal stenosis. No central canal stenosis. L1-L2: No significant disc bulge. No evidence of neural foraminal stenosis. No central canal stenosis. L2-L3: No significant disc bulge. No evidence of neural foraminal stenosis. No central canal stenosis. L3-L4: No significant disc bulge. No evidence of neural foraminal stenosis. No central canal stenosis. L4-L5: No significant disc bulge. No evidence of neural foraminal stenosis. No central canal stenosis. L5-S1: No significant disc bulge. No evidence of neural foraminal stenosis. No central canal stenosis. IMPRESSION: 1.  No acute osseous injury of the lumbar spine. 2. No discitis or osteomyelitis of the lumbar spine. Electronically Signed   By: Elige Ko   On: 01/09/2018 20:31   Dg Chest Port 1 View  Result Date: 01/24/2018 CLINICAL DATA:  Altered mental status. EXAM: PORTABLE CHEST 1 VIEW COMPARISON:  Radiograph of January 06, 2018. FINDINGS: The heart size and mediastinal contours are within normal limits. No  pneumothorax or pleural effusion is noted. Left lung is clear. Mildly improved right upper lobe opacity is noted suggesting improving pneumonia. The visualized skeletal structures are unremarkable. IMPRESSION: Mildly improved right upper lobe pneumonia. Electronically Signed   By: Lupita Raider, M.D.   On: 01/24/2018 12:46   Dg Chest Portable 1 View  Result Date: 01/06/2018 CLINICAL DATA:  Worsening pneumonia, shortness of breath EXAM: PORTABLE CHEST 1 VIEW COMPARISON:  Portable exam 1912 hours compared to 01/03/2018 FINDINGS: Normal heart size, mediastinal contours, and pulmonary vascularity. Increasing diffuse RIGHT lung infiltrates consistent with worsening pneumonia. LEFT lung grossly clear. No pleural effusion or pneumothorax. Bones unremarkable. IMPRESSION: Increasing RIGHT lung infiltrates consistent with worsening pneumonia. Electronically Signed   By: Ulyses Southward M.D.   On: 01/06/2018 20:17   Ct Renal Stone Study  Result Date: 01/24/2018 CLINICAL DATA:  52 year old female EXAM: CT ABDOMEN AND PELVIS WITHOUT CONTRAST TECHNIQUE: Multidetector CT imaging of the abdomen and pelvis was performed following the standard protocol without IV contrast. COMPARISON:  Chest CT 04/17/2017, abdominal CT 08/03/2016 FINDINGS: Lower chest: Multifocal airspace opacities of the right lower lung, a few with internal cavitation. This has progressed from the comparison CT of 04/17/2017 Hepatobiliary: Unremarkable liver.  Unremarkable gallbladder Pancreas: Unremarkable Spleen: Punctate calcifications of the spleen suggesting prior granulomatous disease Adrenals/Urinary Tract: Bilateral adrenal glands unremarkable. Right kidney without hydronephrosis or nephrolithiasis. Left kidney with nonobstructive stone at the superior collecting system. No hydronephrosis or perinephric stranding. Unremarkable course the bilateral ureters. Urinary bladder unremarkable with no  radiopaque stones or inflammation. Stomach/Bowel:  Unremarkable stomach. Unremarkable small bowel with no focal dilation. No transition point. No inflammatory changes. Normal appendix. Moderate stool burden. No focal wall thickening of the colon. Colonic diverticula without associated inflammation. Vascular/Lymphatic: Minimal calcifications of the aorta and iliac arteries. No lymphadenopathy. Reproductive: Unremarkable pelvic structures. Other: None Musculoskeletal: No acute displaced fracture. Degenerative changes of the hips. IMPRESSION: No acute CT finding of the abdomen/pelvis. Multiple focal nodular opacities of the visualized right middle lobe and right lower lobe, less so on the left. These have progressed from the comparison chest CT of 04/17/2017. Findings are favored to represent infection of the right lung, with the differential including atypical infection and/or septic emboli. Metastatic disease cannot be excluded and correlation with the patient's history of malignancy is recommended. If further imaging is warranted, consider CT chest. Nonobstructive left-sided nephrolithiasis. Electronically Signed   By: Gilmer Mor D.O.   On: 01/24/2018 15:41       Subjective: Feeling better.  Tolerating solid diet.  Had some nausea, but no vomiting.  Discharge Exam: Vitals:   01/28/18 1600 01/28/18 2103 01/29/18 0547 01/29/18 0840  BP: 130/87 103/60 96/70   Pulse: 89 85 86   Resp:  18 18   Temp: 97.8 F (36.6 C) 98.2 F (36.8 C) (!) 97.5 F (36.4 C)   TempSrc:  Oral Oral   SpO2: 100% 91% 100% 99%  Weight:      Height:        General: Pt is alert, awake, not in acute distress Cardiovascular: RRR, S1/S2 +, no rubs, no gallops Respiratory: CTA bilaterally, no wheezing, no rhonchi Abdominal: Soft, NT, ND, bowel sounds + Extremities: no edema, no cyanosis    The results of significant diagnostics from this hospitalization (including imaging, microbiology, ancillary and laboratory) are listed below for reference.      Microbiology: Recent Results (from the past 240 hour(s))  Culture, blood (Routine X 2) w Reflex to ID Panel     Status: Abnormal   Collection Time: 01/24/18 12:49 PM  Result Value Ref Range Status   Specimen Description   Final    BLOOD BLOOD RIGHT ARM PICC LINE Performed at [facility], [address] [place], [place] [address]    Special Requests   Final    BOTTLES DRAWN AEROBIC AND ANAEROBIC Blood Culture adequate volume Performed at [facility], [address], [place], [place] [address]    Culture  Setup Time   Final    GRAM POSITIVE COCCI Gram Stain Report Called to,Read Back By and Verified With: MARTIN L. @ 1704 ON [id number] BY [facility] ANAEROBIC BOTTLE ONLY    Culture (A)  Final    STAPHYLOCOCCUS SPECIES (COAGULASE NEGATIVE) THE SIGNIFICANCE OF ISOLATING THIS ORGANISM FROM A SINGLE SET OF BLOOD CULTURES WHEN MULTIPLE SETS ARE DRAWN IS UNCERTAIN. PLEASE NOTIFY THE MICROBIOLOGY DEPARTMENT WITHIN ONE WEEK IF SPECIATION AND SENSITIVITIES ARE REQUIRED. Performed at [facility], [address], [place], [place] [address]    Report Status 01/27/2018 FINAL  Final  Blood Culture ID Panel (Reflexed)     Status: Abnormal   Collection Time: 01/24/18 12:49 PM  Result Value Ref Range Status   Enterococcus species NOT DETECTED NOT DETECTED Final   Listeria monocytogenes NOT DETECTED NOT DETECTED Final   Staphylococcus species DETECTED (A) NOT DETECTED Final    Comment: Methicillin (oxacillin) resistant coagulase negative staphylococcus. Possible blood culture contaminant (unless isolated from more than one blood culture draw or clinical case  suggests pathogenicity). No antibiotic treatment is indicated for blood  culture contaminants. CRITICAL RESULT CALLED TO, READ BACK BY AND VERIFIED WITH: PHARMD [id number] 10/28 FCP    Staphylococcus aureus (BCID) NOT DETECTED NOT DETECTED Final   Methicillin resistance DETECTED (A) NOT DETECTED Final    Comment: CRITICAL RESULT CALLED TO,  READ BACK BY AND VERIFIED WITH: PHARMD [id number] 10/28 FCP    Streptococcus species NOT DETECTED NOT DETECTED Final   Streptococcus agalactiae NOT DETECTED NOT DETECTED Final   Streptococcus pneumoniae NOT DETECTED NOT DETECTED Final   Streptococcus pyogenes NOT DETECTED NOT DETECTED Final   Acinetobacter baumannii NOT DETECTED NOT DETECTED Final   Enterobacteriaceae species NOT DETECTED NOT DETECTED Final   Enterobacter cloacae complex NOT DETECTED NOT DETECTED Final   Escherichia coli NOT DETECTED NOT DETECTED Final   Klebsiella oxytoca NOT DETECTED NOT DETECTED Final   Klebsiella pneumoniae NOT DETECTED NOT DETECTED Final   Proteus species NOT DETECTED NOT DETECTED Final   Serratia marcescens NOT DETECTED NOT DETECTED Final   Haemophilus influenzae NOT DETECTED NOT DETECTED Final   Neisseria meningitidis NOT DETECTED NOT DETECTED Final   Pseudomonas aeruginosa NOT DETECTED NOT DETECTED Final   Candida albicans NOT DETECTED NOT DETECTED Final   Candida glabrata NOT DETECTED NOT DETECTED Final   Candida krusei NOT DETECTED NOT DETECTED Final   Candida parapsilosis NOT DETECTED NOT DETECTED Final   Candida tropicalis NOT DETECTED NOT DETECTED Final    Comment: Performed at [facility], [address] [place], [place] [address]  Culture, blood (Routine X 2) w Reflex to ID Panel     Status: None   Collection Time: 01/24/18  1:19 PM  Result Value Ref Range Status   Specimen Description BLOOD LEFT HAND  Final   Special Requests   Final    BOTTLES DRAWN AEROBIC ONLY Blood Culture results may not be optimal due to an inadequate volume of blood received in culture bottles   Culture   Final    NO GROWTH 5 DAYS Performed at [facility], [address] [place], [place] [address]    Report Status 01/29/2018 FINAL  Final  MRSA PCR Screening     Status: None   Collection Time: 01/24/18  5:26 PM  Result Value Ref Range Status   MRSA by PCR NEGATIVE NEGATIVE Final    Comment:        The  GeneXpert MRSA Assay (FDA approved for NASAL specimens only), is one component of a comprehensive MRSA colonization surveillance program. It is not intended to diagnose MRSA infection nor to guide or monitor treatment for MRSA infections. Performed at [facility], [address] [place], [place] [address]      Labs: BNP (last 3 results) No results for input(s): BNP in the last 8760 hours. Basic Metabolic Panel: Recent Labs  Lab 01/24/18 1235 01/24/18 1325 01/25/18 0449 01/26/18 0502 01/27/18 0640  NA 138 136 139 135 135  K 4.7 4.7 3.7 3.7 3.7  CL 101 103 107 106 103  CO2 26  --  26 24 28   GLUCOSE 164* 160* 85 73 85  BUN 21* 21* 14 10 8   CREATININE 1.41* 1.40* 0.76 0.68 0.75  CALCIUM 9.1  --  8.0* 7.7* 7.8*   Liver Function Tests: Recent Labs  Lab 01/24/18 1235 01/26/18 0502  AST 41 21  ALT 32 12  ALKPHOS 129* 87  BILITOT 0.9 0.6  PROT 7.1 4.9*  ALBUMIN 2.9* 2.1*   Recent Labs  Lab 01/24/18 1235 01/25/18 0449  LIPASE 71* 48   No results for input(s): AMMONIA in the last 168 hours. CBC: Recent Labs  Lab 01/24/18 1235  01/25/18 0737 01/26/18 0502 01/27/18 0640 01/28/18 0547 01/29/18 0534  WBC 11.3*  --  4.3 3.7* 4.3 4.7 3.5*  NEUTROABS 9.2*  --   --   --   --   --   --   HGB 11.6*   < > 8.2* 7.7* 8.1* 8.0* 8.0*  HCT 37.9   < > 27.0* 25.4* 26.8* 26.3* 26.2*  MCV 91.3  --  91.5 93.0 92.1 92.3 94.2  PLT 254  --  140* 129* 123* 143* 151   < > = values in this interval not displayed.   Cardiac Enzymes: No results for input(s): CKTOTAL, CKMB, CKMBINDEX, TROPONINI in the last 168 hours. BNP: Invalid input(s): POCBNP CBG: Recent Labs  Lab 01/24/18 1218  GLUCAP 160*   D-Dimer No results for input(s): DDIMER in the last 72 hours. Hgb A1c No results for input(s): HGBA1C in the last 72 hours. Lipid Profile No results for input(s): CHOL, HDL, LDLCALC, TRIG, CHOLHDL, LDLDIRECT in the last 72 hours. Thyroid function studies No results for input(s):  TSH, T4TOTAL, T3FREE, THYROIDAB in the last 72 hours.  Invalid input(s): FREET3 Anemia work up No results for input(s): VITAMINB12, FOLATE, FERRITIN, TIBC, IRON, RETICCTPCT in the last 72 hours. Urinalysis    Component Value Date/Time   COLORURINE YELLOW 01/24/2018 1252   APPEARANCEUR CLEAR 01/24/2018 1252   LABSPEC 1.008 01/24/2018 1252   PHURINE 6.0 01/24/2018 1252   GLUCOSEU NEGATIVE 01/24/2018 1252   HGBUR SMALL (A) 01/24/2018 1252   BILIRUBINUR NEGATIVE 01/24/2018 1252   KETONESUR NEGATIVE 01/24/2018 1252   PROTEINUR NEGATIVE 01/24/2018 1252   UROBILINOGEN 0.2 01/02/2012 1615   NITRITE NEGATIVE 01/24/2018 1252   LEUKOCYTESUR NEGATIVE 01/24/2018 1252   Sepsis Labs Invalid input(s): PROCALCITONIN,  WBC,  LACTICIDVEN Microbiology Recent Results (from the past 240 hour(s))  Culture, blood (Routine X 2) w Reflex to ID Panel     Status: Abnormal   Collection Time: 01/24/18 12:49 PM  Result Value Ref Range Status   Specimen Description   Final    BLOOD BLOOD RIGHT ARM PICC LINE Performed at [facility], [address], [place], [place] [address]    Special Requests   Final    BOTTLES DRAWN AEROBIC AND ANAEROBIC Blood Culture adequate volume Performed at [facility], [address], [place], [place] [address]    Culture  Setup Time   Final    GRAM POSITIVE COCCI Gram Stain Report Called to,Read Back By and Verified With: MARTIN L. @ 1704 ON [id number] BY [facility] ANAEROBIC BOTTLE ONLY    Culture (A)  Final    STAPHYLOCOCCUS SPECIES (COAGULASE NEGATIVE) THE SIGNIFICANCE OF ISOLATING THIS ORGANISM FROM A SINGLE SET OF BLOOD CULTURES WHEN MULTIPLE SETS ARE DRAWN IS UNCERTAIN. PLEASE NOTIFY THE MICROBIOLOGY DEPARTMENT WITHIN ONE WEEK IF SPECIATION AND SENSITIVITIES ARE REQUIRED. Performed at [facility], [address], [place], [place] [address]    Report Status 01/27/2018 FINAL  Final  Blood Culture ID Panel (Reflexed)     Status: Abnormal   Collection Time: 01/24/18  12:49 PM  Result Value Ref Range Status   Enterococcus species NOT DETECTED NOT DETECTED Final   Listeria monocytogenes NOT DETECTED NOT DETECTED Final   Staphylococcus species DETECTED (A) NOT DETECTED Final    Comment: Methicillin (oxacillin) resistant coagulase negative staphylococcus. Possible blood culture  contaminant (unless isolated from more than one blood culture draw or clinical case suggests pathogenicity). No antibiotic treatment is indicated for blood  culture contaminants. CRITICAL RESULT CALLED TO, READ BACK BY AND VERIFIED WITH: PHARMD [id number] 10/28 FCP    Staphylococcus aureus (BCID) NOT DETECTED NOT DETECTED Final   Methicillin resistance DETECTED (A) NOT DETECTED Final    Comment: CRITICAL RESULT CALLED TO, READ BACK BY AND VERIFIED WITH: PHARMD [id number] 10/28 FCP    Streptococcus species NOT DETECTED NOT DETECTED Final   Streptococcus agalactiae NOT DETECTED NOT DETECTED Final   Streptococcus pneumoniae NOT DETECTED NOT DETECTED Final   Streptococcus pyogenes NOT DETECTED NOT DETECTED Final   Acinetobacter baumannii NOT DETECTED NOT DETECTED Final   Enterobacteriaceae species NOT DETECTED NOT DETECTED Final   Enterobacter cloacae complex NOT DETECTED NOT DETECTED Final   Escherichia coli NOT DETECTED NOT DETECTED Final   Klebsiella oxytoca NOT DETECTED NOT DETECTED Final   Klebsiella pneumoniae NOT DETECTED NOT DETECTED Final   Proteus species NOT DETECTED NOT DETECTED Final   Serratia marcescens NOT DETECTED NOT DETECTED Final   Haemophilus influenzae NOT DETECTED NOT DETECTED Final   Neisseria meningitidis NOT DETECTED NOT DETECTED Final   Pseudomonas aeruginosa NOT DETECTED NOT DETECTED Final   Candida albicans NOT DETECTED NOT DETECTED Final   Candida glabrata NOT DETECTED NOT DETECTED Final   Candida krusei NOT DETECTED NOT DETECTED Final   Candida parapsilosis NOT DETECTED NOT DETECTED Final   Candida tropicalis NOT DETECTED NOT DETECTED Final    Comment:  Performed at [facility], [address], [place], [place] [address]  Culture, blood (Routine X 2) w Reflex to ID Panel     Status: None   Collection Time: 01/24/18  1:19 PM  Result Value Ref Range Status   Specimen Description BLOOD LEFT HAND  Final   Special Requests   Final    BOTTLES DRAWN AEROBIC ONLY Blood Culture results may not be optimal due to an inadequate volume of blood received in culture bottles   Culture   Final    NO GROWTH 5 DAYS Performed at [facility], [address], [place], [place] [address]    Report Status 01/29/2018 FINAL  Final  MRSA PCR Screening     Status: None   Collection Time: 01/24/18  5:26 PM  Result Value Ref Range Status   MRSA by PCR NEGATIVE NEGATIVE Final    Comment:        The GeneXpert MRSA Assay (FDA approved for NASAL specimens only), is one component of a comprehensive MRSA colonization surveillance program. It is not intended to diagnose MRSA infection nor to guide or monitor treatment for MRSA infections. Performed at [facility], [address], [place], [place] [address]      Time coordinating discharge:  SIGNED:   Erick Blinks, MD  [facility] 01/29/2018, 4:31 PM Pager   If 7PM-7AM, please contact night-coverage [email or web address] Password [email or web address]

## 2018-01-29 NOTE — Progress Notes (Signed)
Subjective:  Denies abd pain. Complains of nausea. No melena, brbpr. Last BM yesterday   Objective: Vital signs in last 24 hours: Temp:  [97.5 F (36.4 C)-98.2 F (36.8 C)] 97.5 F (36.4 C) (10/30 0547) Pulse Rate:  [25-89] 86 (10/30 0547) Resp:  [10-26] 18 (10/30 0547) BP: (96-130)/(60-87) 96/70 (10/30 0547) SpO2:  [91 %-100 %] 100 % (10/30 0547) Last BM Date: 01/28/18 General:   Alert, pleasant, appears older than stated age. Cooperative in NAD Head:  Normocephalic and atraumatic. Eyes:  Sclera clear, no icterus.  Abdomen:  Soft, nontender and nondistended. Normal bowel sounds, without guarding, and without rebound.   Extremities:  Without clubbing, deformity or edema. Neurologic:  Alert and  oriented x4;   Psych:  Alert and cooperative. Normal mood and affect.  Intake/Output from previous day: 10/29 0701 - 10/30 0700 In: 590 [P.O.:480; I.V.:110] Out: -  Intake/Output this shift: No intake/output data recorded.  Lab Results: CBC Recent Labs    01/27/18 0640 01/28/18 0547 01/29/18 0534  WBC 4.3 4.7 3.5*  HGB 8.1* 8.0* 8.0*  HCT 26.8* 26.3* 26.2*  MCV 92.1 92.3 94.2  PLT 123* 143* 151   BMET Recent Labs    01/27/18 0640  NA 135  K 3.7  CL 103  CO2 28  GLUCOSE 85  BUN 8  CREATININE 0.75  CALCIUM 7.8*   LFTs No results for input(s): BILITOT, BILIDIR, IBILI, ALKPHOS, AST, ALT, PROT, ALBUMIN in the last 72 hours. No results for input(s): LIPASE in the last 72 hours. PT/INR No results for input(s): LABPROT, INR in the last 72 hours.    Imaging Studies: Dg Chest 2 View  Result Date: 01/03/2018 CLINICAL DATA:  Cough congestion and wheezing.  Shortness of breath. EXAM: CHEST - 2 VIEW COMPARISON:  Chest radiograph 05/13/2017, chest CT 04/17/2017 FINDINGS: Cardiomediastinal silhouette is normal. Mediastinal contours appear intact. There is no evidence of pleural effusion or pneumothorax. Interval development of patchy and nodular airspace consolidation throughout  the right lung, more confluent in the upper lobe. Scattered areas of airspace consolidation in the left lower hemithorax. Osseous structures are without acute abnormality. Soft tissues are grossly normal. IMPRESSION: Interval development of patchy and nodular airspace consolidation throughout the right lung, more confluent in the upper lobe. More subtle areas of airspace consolidation the left lower thorax. In the acute clinical settings, this likely represents multifocal pneumonia, however follow-up to resolution after empiric treatment should be considered. Electronically Signed   By: Ted Mcalpine M.D.   On: 01/03/2018 14:42   Ct Head Wo Contrast  Result Date: 01/03/2018 CLINICAL DATA:  Altered level of consciousness, fell 2 days ago, abrasions to face, history hypertension and smoking, history subdural hematoma and surgery EXAM: CT HEAD WITHOUT CONTRAST TECHNIQUE: Contiguous axial images were obtained from the base of the skull through the vertex without intravenous contrast. Sagittal and coronal MPR images reconstructed from axial data set. COMPARISON:  01/02/2012 FINDINGS: Brain: Normal ventricular morphology. No midline shift or mass effect. 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 Otherwise normal appearance of brain parenchyma. No intracranial hemorrhage, mass lesion or evidence of acute infarction. No extra-axial fluid collections. Vascular: Unremarkable Skull: Prior LEFT frontotemporal parietal craniotomy. No acute osseous findings. 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 Prior LEFT craniotomy. No acute intracranial abnormalities. Electronically Signed   By: Ulyses Southward M.D.   On: 01/03/2018 14:32   Ct Chest W Contrast  Result Date: 01/24/2018 CLINICAL  DATA:  Pneumonia with chest pain EXAM: CT CHEST WITH CONTRAST TECHNIQUE: Multidetector CT imaging of the chest was performed during intravenous  contrast administration. CONTRAST:  60mL ISOVUE-300 IOPAMIDOL (ISOVUE-300) INJECTION 61% COMPARISON:  Chest CTA 04/17/2017 Chest radiograph 01/24/2018 FINDINGS: Cardiovascular: No significant vascular findings. Normal heart size. No pericardial effusion. Calcific aortic atherosclerosis. Mediastinum/Nodes: No enlarged mediastinal, hilar, or axillary lymph nodes. Thyroid gland, trachea, and esophagus demonstrate no significant findings. Lungs/Pleura: There is multifocal consolidation within the right lung, greatest in the right upper lobe. The sites are associated with extensive cystic change. The left lung is clear. No pleural effusion or pneumothorax. Upper Abdomen: No acute abnormality. Musculoskeletal: No chest wall abnormality. No acute or significant osseous findings. IMPRESSION: 1. Multifocal cystic consolidation within the right lung, involving all lobes, with the largest area located in the right upper lobe. This may be secondary to chronic aspiration or atypical infection, such as non-tuberculous mycobacterium. 2.  Aortic Atherosclerosis (ICD10-I70.0). Electronically Signed   By: Deatra Robinson M.D.   On: 01/24/2018 22:08   Mr Lumbar Spine Wo Contrast  Result Date: 01/09/2018 CLINICAL DATA:  Recent fall, low back pain for 2 months EXAM: MRI LUMBAR SPINE WITHOUT CONTRAST TECHNIQUE: Multiplanar, multisequence MR imaging of the lumbar spine was performed. No intravenous contrast was administered. COMPARISON:  None. FINDINGS: Segmentation:  Standard. Alignment:  Physiologic. Vertebrae:  No fracture, evidence of discitis, or bone lesion. Conus medullaris and cauda equina: Conus extends to the T12-L1 level. Conus and cauda equina appear normal. Paraspinal and other soft tissues: No acute paraspinal abnormality. Soft tissue edema in the subcutaneous fat along the posterior aspect of the back. Disc levels: Disc spaces: Disc spaces are maintained. T12-L1: No significant disc bulge. No evidence of neural foraminal  stenosis. No central canal stenosis. L1-L2: No significant disc bulge. No evidence of neural foraminal stenosis. No central canal stenosis. L2-L3: No significant disc bulge. No evidence of neural foraminal stenosis. No central canal stenosis. L3-L4: No significant disc bulge. No evidence of neural foraminal stenosis. No central canal stenosis. L4-L5: No significant disc bulge. No evidence of neural foraminal stenosis. No central canal stenosis. L5-S1: No significant disc bulge. No evidence of neural foraminal stenosis. No central canal stenosis. IMPRESSION: 1.  No acute osseous injury of the lumbar spine. 2. No discitis or osteomyelitis of the lumbar spine. Electronically Signed   By: Elige Ko   On: 01/09/2018 20:31   Dg Chest Port 1 View  Result Date: 01/24/2018 CLINICAL DATA:  Altered mental status. EXAM: PORTABLE CHEST 1 VIEW COMPARISON:  Radiograph of January 06, 2018. FINDINGS: The heart size and mediastinal contours are within normal limits. No pneumothorax or pleural effusion is noted. Left lung is clear. Mildly improved right upper lobe opacity is noted suggesting improving pneumonia. The visualized skeletal structures are unremarkable. IMPRESSION: Mildly improved right upper lobe pneumonia. Electronically Signed   By: Lupita Raider, M.D.   On: 01/24/2018 12:46   Dg Chest Portable 1 View  Result Date: 01/06/2018 CLINICAL DATA:  Worsening pneumonia, shortness of breath EXAM: PORTABLE CHEST 1 VIEW COMPARISON:  Portable exam 1912 hours compared to 01/03/2018 FINDINGS: Normal heart size, mediastinal contours, and pulmonary vascularity. Increasing diffuse RIGHT lung infiltrates consistent with worsening pneumonia. LEFT lung grossly clear. No pleural effusion or pneumothorax. Bones unremarkable. IMPRESSION: Increasing RIGHT lung infiltrates consistent with worsening pneumonia. Electronically Signed   By: Ulyses Southward M.D.   On: 01/06/2018 20:17   Ct Renal Stone Study  Result Date:  01/24/2018  CLINICAL DATA:  52 year old female EXAM: CT ABDOMEN AND PELVIS WITHOUT CONTRAST TECHNIQUE: Multidetector CT imaging of the abdomen and pelvis was performed following the standard protocol without IV contrast. COMPARISON:  Chest CT 04/17/2017, abdominal CT 08/03/2016 FINDINGS: Lower chest: Multifocal airspace opacities of the right lower lung, a few with internal cavitation. This has progressed from the comparison CT of 04/17/2017 Hepatobiliary: Unremarkable liver.  Unremarkable gallbladder Pancreas: Unremarkable Spleen: Punctate calcifications of the spleen suggesting prior granulomatous disease Adrenals/Urinary Tract: Bilateral adrenal glands unremarkable. Right kidney without hydronephrosis or nephrolithiasis. Left kidney with nonobstructive stone at the superior collecting system. No hydronephrosis or perinephric stranding. Unremarkable course the bilateral ureters. Urinary bladder unremarkable with no radiopaque stones or inflammation. Stomach/Bowel: Unremarkable stomach. Unremarkable small bowel with no focal dilation. No transition point. No inflammatory changes. Normal appendix. Moderate stool burden. No focal wall thickening of the colon. Colonic diverticula without associated inflammation. Vascular/Lymphatic: Minimal calcifications of the aorta and iliac arteries. No lymphadenopathy. Reproductive: Unremarkable pelvic structures. Other: None Musculoskeletal: No acute displaced fracture. Degenerative changes of the hips. IMPRESSION: No acute CT finding of the abdomen/pelvis. Multiple focal nodular opacities of the visualized right middle lobe and right lower lobe, less so on the left. These have progressed from the comparison chest CT of 04/17/2017. Findings are favored to represent infection of the right lung, with the differential including atypical infection and/or septic emboli. Metastatic disease cannot be excluded and correlation with the patient's history of malignancy is recommended. If  further imaging is warranted, consider CT chest. Nonobstructive left-sided nephrolithiasis. Electronically Signed   By: Gilmer Mor D.O.   On: 01/24/2018 15:41  [2 weeks]   Assessment: 52 year old female presenting with right flank pain, acute renal injury, worsening anemia this admission without overt GI bleeding.  Hemoglobin 11 range on admission, steadily decline but stabilized in the 8 range, heme positive stools.  No prior colonoscopy.  Recent prior hospitalization for pneumonia, MSSA bacteremia, PICC line in place.  Blood cultures this admission with methicillin resistant Staph species from PICC line, blood culture from left hand may be SUBOPTIMAL due to low volume received but have resulted negative. She is asymptomatic, afebrile, no leukocytosis. Per pharmacy note, attending felt like contaminant. Off Antibiotics since 10/28.  Anemia, iron low normal, ferritin elevated, but could be falsely elevated in setting of acute illness.  Documented heme positive stools.  EGD yesterday with with edema and erythema in the gastric antrum and duodenal bulb, pathology pending.  Plan: 1. Patient did not tolerate bowel prep, golytely or miralax prep, due to vomiting. Consider colonoscopy as outpatient if able to tolerate a different prep.  2. F/u pending biopsies.  3. Protonix daily.  4. Will follow up in the office in 2-3 months. Will follow peripherally.   Leanna Battles. [facility] [phone] 10/30/201910:34 AM     LOS: 5 days

## 2018-01-29 NOTE — Telephone Encounter (Signed)
Needs hospital follow up in 2 months for anemia, consider colonoscopy with low volume bowel prep.

## 2018-01-29 NOTE — Progress Notes (Signed)
PICC removed and discharge instructions reviewed.  Scripts sent to pharmacy and follow up appt scheduled.

## 2018-01-30 ENCOUNTER — Encounter (HOSPITAL_COMMUNITY): Payer: Self-pay | Admitting: Gastroenterology

## 2018-02-03 ENCOUNTER — Telehealth: Payer: Self-pay | Admitting: Gastroenterology

## 2018-02-03 NOTE — Telephone Encounter (Signed)
Please call pt. HER stomach/SMALL BOWEL Bx shows gastritis/DUODENITIS.   TAKE PROTONIX DAILY. FOLLOW UP IN 2 MOS.

## 2018-02-04 NOTE — Telephone Encounter (Signed)
Patient scheduled.

## 2018-02-04 NOTE — Telephone Encounter (Signed)
Called, gentleman answered phone and said she does not live there. He could not give me a contact number, but said he could get the message to her to call.

## 2018-02-05 NOTE — Telephone Encounter (Signed)
Letter mailed for pt to call.  

## 2018-02-18 ENCOUNTER — Telehealth: Payer: Self-pay

## 2018-02-18 NOTE — Telephone Encounter (Signed)
PT received a letter to call for results from procedure. She is aware. She will call back to update her phone number, it's in her phone and she can't get it now.  She said her address is still the same.

## 2018-02-18 NOTE — Telephone Encounter (Signed)
Pt called back and her phone number is [phone].  I have updated her info.

## 2018-02-24 ENCOUNTER — Telehealth: Payer: Self-pay | Admitting: Orthopaedic Surgery

## 2018-02-24 NOTE — Telephone Encounter (Signed)
Patient called to relay she has been in [facility], and is now home. States she did not get to follow up from an emergency room in October, said hurt her back from a fall back then. Xrays were done and are in her chart.  Discussed appointment, although patient's insurance requires referral from primary care. States she will check with them. Thanked us for calling her back.

## 2018-04-29 ENCOUNTER — Telehealth: Payer: Self-pay | Admitting: Gastroenterology

## 2018-04-29 ENCOUNTER — Encounter: Payer: Self-pay | Admitting: Gastroenterology

## 2018-04-29 ENCOUNTER — Ambulatory Visit: Payer: Medicaid Other | Admitting: Gastroenterology

## 2018-04-29 NOTE — Telephone Encounter (Signed)
Patient was a no show and letter sent  °

## 2018-07-15 ENCOUNTER — Ambulatory Visit: Payer: Medicaid Other | Admitting: Student in an Organized Health Care Education/Training Program

## 2018-07-24 NOTE — Progress Notes (Signed)
Patient's Name: Melanie [name]  MRN: [id number]  Referring Provider: Alfonse [name]  DOB: Sep 03, 1965  PCP: Melanie Flavors, [name]  DOS: 07/29/2018  Note by: Melanie Santa, [name]  Service setting: Ambulatory outpatient  Specialty: Interventional Pain Management  Location: [facility] Virtual Visit  Visit type: Initial Patient Evaluation  Patient type: New Patient   Pain Management Virtual Encounter Note - Virtual Visit via Telephone Telehealth (real-time audio visits between healthcare provider and patient).  Patient's Phone No.:  [phone] (home); There is no such number on file (mobile).; (Preferred) [phone] No e-mail address on record  [id number] [address] [id number]: [phone]: [phone]  Pre-screening note:  Our staff contacted Ms. [name] offered her an "in person", "face-to-face" appointment versus a telephone encounter. She indicated preferring the telephone encounter, at this time.  Primary Reason(s) for Visit: Tele-Encounter for initial evaluation of one or more chronic problems (new to examiner) potentially causing chronic pain, and posing a threat to normal musculoskeletal function. (Level of risk: High) CC: Headache (brain surgery 2014 Melanie/t blunt force trauma ) and Back Pain (lumbar Melanie/t a fall )  I contacted Melanie Paganon 07/29/2018 at 10:48 AM via telephone and clearly identified myself as Melanie [name]. I verified that I was speaking with the correct person using two identifiers (Name and date of birth: 702-Jun-1967.  Advanced Informed Consent I sought verbal advanced consent from Melanie [name] LLCfor virtual visit interactions. I informed Melanie [name] of possible security and privacy concerns, risks, and limitations associated with providing "not-in-person" medical evaluation and management services. I also informed Melanie [name] of the availability of "in-person"  appointments. Finally, I informed her that there would be a charge for the virtual visit and that she could be  personally, fully or partially, financially responsible for it. Ms. CVonseggernexpressed understanding and agreed to proceed.   HPI  [name] a 53y.o. year old, female patient, contacted today for an initial evaluation of her chronic pain. She has BIPOLAR DISORDER UNSPECIFIED; Substance abuse ([place]; SUBDURAL HEMATOMA; PNEUMONIA; Seizure ([place]; Acute respiratory failure ([place]; Pneumococcal pneumonia ([place]; Thrombocytopenia ([place]; Multifocal pneumonia; Asthma exacerbation; Falls; Anemia; Hypokalemia; AKI (acute kidney injury) ([place]; Hypertension; Lymphopenia; CAP (community acquired pneumonia); HCAP (healthcare-associated pneumonia); GERD (gastroesophageal reflux disease); Tobacco use; Abnormal EKG; Tachycardia; Bacteremia due to Staphylococcus aureus; MSSA (methicillin susceptible Staphylococcus aureus) pneumonia ([place]; Acute right flank pain; Lactic acidosis; Right-sided low back pain without sciatica; Heme positive stool; Gastritis and gastroduodenitis; 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  Pain Assessment: Location: Posterior Head(brain surgery 2014 Melanie/t blount force trauma ) Radiating: denies Onset: More than a month ago Duration: Chronic pain Quality: Burning, Aching, Discomfort, Constant Severity: 7 /10 (subjective, self-reported pain score)  Effect on ADL: dizziness, memory loss  Timing: Constant Modifying factors: hot baths, relaxation therapy, medications,    Onset and Duration: Sudden and Present longer than 3 months Cause of pain: Trauma Severity: Getting worse, NAS-11 at its worse: 9/10, NAS-11 at its best: 5/10, NAS-11 now: 8/10 and NAS-11 on the average: 7/10 Timing: Not influenced by the time of the day Aggravating Factors: Bending and Motion Alleviating Factors: Hot packs, Medications, Relaxation therapy and Warm  showers or baths Associated Problems: Depression, Personality changes, Tingling and Pain that wakes patient up Quality of Pain: Burning, Cramping, Pressure-like, Shooting, Splitting and Uncomfortable Previous Examinations or Tests: X-rays Previous Treatments: Narcotic medications and  The patient denies botox therapy for headaches  Patient complaining of low back pain.  She states that this started after a fall that she sustained in August 2019.  X-rays taken after a fall showed L5-S1 pars defect.  Patient continues to have low back pain and an MRI of her lumbar spine was done in October 2019 which was unremarkable.  It did not show any neuroforaminal or central canal stenosis or any sort of lumbar degenerative disease.  Patient states that her pain is very severe.  She has trouble with recall and she is a poor historian.  Patient from the start of the visit started talking about how her current medications are not effective and that she needs something "stronger".  She kept stating that her primary care provider referred her to the pain clinic so that she could be prescribed something stronger.  When attempting to ask her about multimodal analgesics such as gabapentin, patient was very dismissive and states that none of this works.  Patient does have a history of substance abuse, cocaine which she was not forthright about.  Furthermore the patient states that she has trouble with recall and has permanent memory issues and chronic headaches as a result of her cranial surgery due to her [name].  The patient was informed that my practice is divided into two sections: an interventional pain management section, as well as a completely separate and distinct medication management section. I explained that I have procedure days for my interventional therapies, and evaluation days for follow-ups and medication management. Because of the amount of documentation required during both, they are kept separated. This means  that there is the possibility that she may be scheduled for a procedure on one day, and medication management the next. I have also informed her that because of staffing and facility limitations, I no longer take patients for medication management only. To illustrate the reasons for this, I gave the patient the example of surgeons, and how inappropriate it would be to refer a patient to his/her care, just to write for the post-surgical antibiotics on a surgery done by a different surgeon.   Because interventional pain management is my board-certified specialty, the patient was informed that joining my practice means that they are open to any and all interventional therapies. I made it clear that this does not mean that they will be forced to have any procedures done. What this means is that I believe interventional therapies to be essential part of the diagnosis and proper management of chronic pain conditions. Therefore, patients not interested in these interventional alternatives will be better served under the care of a different practitioner.  The patient was also made aware of my Comprehensive Pain Management Safety Guidelines where by joining my practice, they limit all of their nerve blocks and joint injections to those done by our practice, for as long as we are retained to manage their care.   Historic Controlled Substance Pharmacotherapy Review   06/25/2018  1   06/25/2018  Oxycodone-Acetaminophen 5-325  10.00 [address]   [id number]   Vir (0093)   0  7.50 MME  Private Pay   [place]    Medications: Bottles not available for inspection. Pharmacodynamics: Desired effects: Analgesia: The patient reports <50% benefit. Reported improvement in function: The patient reports being unable to accomplish basic ADLs. Clinically meaningful improvement in function (CMIF): Medication does not meet basic CMIF Perceived effectiveness: Described as ineffective and would like to make some changes Undesirable  effects: Side-effects or Adverse reactions: None reported Historical Monitoring: The patient  reports no history of drug use. List of all UDS Test(s): Lab Results  Component Value Date   COCAINSCRNUR NONE DETECTED 01/24/2018   COCAINSCRNUR POSITIVE (A) 01/02/2012   COCAINSCRNUR POSITIVE (A) 09/22/2008   COCAINSCRNUR POSITIVE (A) 06/06/2008   THCU NONE DETECTED 01/24/2018   THCU NONE DETECTED 01/02/2012   THCU POSITIVE (A) 09/22/2008   THCU POSITIVE (A) 06/06/2008   ETH  09/22/2008    <5        LOWEST DETECTABLE LIMIT FOR SERUM ALCOHOL IS 5 mg/dL FOR MEDICAL PURPOSES ONLY   List of other Serum/Urine Drug Screening Test(s):  Lab Results  Component Value Date   COCAINSCRNUR NONE DETECTED 01/24/2018   COCAINSCRNUR POSITIVE (A) 01/02/2012   COCAINSCRNUR POSITIVE (A) 09/22/2008   COCAINSCRNUR POSITIVE (A) 06/06/2008   THCU NONE DETECTED 01/24/2018   THCU NONE DETECTED 01/02/2012   THCU POSITIVE (A) 09/22/2008   THCU POSITIVE (A) 06/06/2008   ETH  09/22/2008    <5        LOWEST DETECTABLE LIMIT FOR SERUM ALCOHOL IS 5 mg/dL FOR MEDICAL PURPOSES ONLY   Historical Background Evaluation: [place] PMP: PDMP reviewed during this encounter. Six (6) year initial data search conducted.             [place] Department of public safety, offender search: Editor, commissioning Information) Non-contributory Risk Assessment Profile: Aberrant behavior: aggressive complaining about need for higher doses or stronger medication, claims that "nothing else works", diminished ability to recognize a problem with one's behavior or use of the medication, drug seeking behavior, dysfunctional emotional process, extensive time discussing medicaiton, non-adherence to medication regimen, repeated negotiations to obtain more medication, request for specific drugs or requesting "Brand Name" drugs and use of illicit substances Risk factors for fatal opioid overdose: None identified today Fatal overdose hazard ratio (HR): Calculation  deferred Non-fatal overdose hazard ratio (HR): Calculation deferred Risk of opioid abuse or dependence: 0.7-3.0% with doses ? 36 MME/day and 6.1-26% with doses ? 120 MME/day. Substance use disorder (SUD) risk level: See below Personal History of Substance Abuse (SUD-Substance use disorder):  Alcohol: Negative  Illegal Drugs: Negative  Rx Drugs: Negative  ORT Risk Level calculation: Low Risk Opioid Risk Tool - 07/28/18 1533      Family History of Substance Abuse   Alcohol  Positive Female    Illegal Drugs  Negative    Rx Drugs  Negative      Personal History of Substance Abuse   Alcohol  Negative    Illegal Drugs  Negative    Rx Drugs  Negative      Age   Age between 80-45 years   No      Psychological Disease   Psychological Disease  Negative    Depression  Negative      Total Score   Opioid Risk Tool Scoring  1    Opioid Risk Interpretation  Low Risk      ORT Scoring interpretation table:  Score <3 = Low Risk for SUD  Score between 4-7 = Moderate Risk for SUD  Score >8 = High Risk for Opioid Abuse   Patient was dishonest about her intake questionnaire above.  She denies any history of substance abuse when she does have documented urine drug screens that are positive for both cocaine and THC on multiple occasions.  Pharmacologic Plan: Non-opioid analgesic therapy offered.            Initial impression:  Poor candidate for opioid analgesics.  High risk for chronic opioid therapy  Meds   Current Outpatient Medications:  .  albuterol (PROVENTIL HFA;VENTOLIN HFA) 108 (90 Base) MCG/ACT inhaler, Inhale 2 puffs into the lungs every 4 (four) hours as needed for wheezing or shortness of breath (or coughing)., Disp: 1 Inhaler, Rfl: 0 .  cyclobenzaprine (FLEXERIL) 10 MG tablet, Take 10 mg by mouth 3 (three) times daily as needed., Disp: , Rfl:  .  gabapentin (NEURONTIN) 300 MG capsule, Take 300 mg by mouth at bedtime., Disp: , Rfl:  .  ondansetron (ZOFRAN ODT) 4 MG disintegrating  tablet, Take 1 tablet (4 mg total) by mouth every 8 (eight) hours as needed for nausea or vomiting., Disp: 20 tablet, Rfl: 0 .  oxyCODONE-acetaminophen (PERCOCET/ROXICET) 5-325 MG tablet, Take 1 tablet by mouth daily., Disp: , Rfl:  .  metoprolol tartrate (LOPRESSOR) 25 MG tablet, Take 0.5 tablets (12.5 mg total) by mouth 2 (two) times daily., Disp: 30 tablet, Rfl: 0  ROS  Cardiovascular: No reported cardiovascular signs or symptoms such as High blood pressure, coronary artery disease, abnormal heart rate or rhythm, heart attack, blood thinner therapy or heart weakness and/or failure Pulmonary or Respiratory: Lung problems Neurological: No reported neurological signs or symptoms such as seizures, abnormal skin sensations, urinary and/or fecal incontinence, being born with an abnormal open spine and/or a tethered spinal cord Review of Past Neurological Studies:  Results for orders placed or performed during the hospital encounter of 01/03/18  CT Head Wo Contrast   Narrative   CLINICAL DATA:  Altered level of consciousness, fell 2 days ago, abrasions to face, history hypertension and smoking, history subdural hematoma and surgery  EXAM: CT HEAD WITHOUT CONTRAST  TECHNIQUE: Contiguous axial images were obtained from the base of the skull through the vertex without intravenous contrast. Sagittal and coronal MPR images reconstructed from axial data set.  COMPARISON:  01/02/2012  FINDINGS: Brain: Normal ventricular morphology. No midline shift or mass effect. 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 Otherwise normal appearance of brain parenchyma. No intracranial hemorrhage, mass lesion or evidence of acute infarction. No extra-axial fluid collections.  Vascular: Unremarkable  Skull: Prior LEFT frontotemporal parietal craniotomy. No acute osseous findings.  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  Prior LEFT craniotomy.  No acute intracranial abnormalities.   Electronically Signed   By: Lavonia Dana M.Melanie.   On: 01/03/2018 14:32   Results for orders placed or performed during the hospital encounter of 09/22/08  MR Brain Wo Contrast   Narrative   Clinical Data: Recent subdural hematoma evacuation.  Expressive aphasia.   MRI HEAD WITHOUT CONTRAST   Technique:  Multiplanar, multiecho pulse sequences of the brain and surrounding structures were obtained according to standard protocol without intravenous contrast.   Comparison: No comparison MR.  Comparison CT 09/29/2008.   Findings: Status post left craniotomy for drainage of left-sided subdural hematoma.  Broad-based left hemispheric region subdural hematoma remains with largest component left superior parietal region.  It is difficult to make direct comparison with the prior CT however, the maximal thickness in the left parietal region on axial imaging now measures 2.9 versus prior 2.5 centimeters.  There is a mass effect with midline shift to the right which has progressed slightly now 5.8 mm versus prior 4.2 mm.  Small right tentorial subdural hematoma appears relatively similar to prior CT.   Small anterior left temporal lobe infarct.  Venous infarct cannot be excluded.   C5-6 disc disease with mild cord flattening incompletely evaluated present exam.   Right vertebral artery is small and may end in a PICA distribution. Major intracranial vascular structures otherwise patent.  The right cerebellar tonsil is minimally low-lying but within range normal limits.  Pituitary and pineal region unremarkable.  Visualized orbital structures within normal limits.   IMPRESSION: Slight increase in size of left hemispheric subdural hematoma and associate mass effect as described above.   No significant change small right tentorial subdural hematoma.   Small acute left anterior temporal lobe infarct.   Please see above discussion.   This has been made a call report.  Provider: Margaretmary Eddy   Psychological-Psychiatric: Depressed and Difficulty sleeping and or falling asleep Gastrointestinal: Vomiting blood (Ulcers) and Reflux or heatburn Genitourinary: No reported renal or genitourinary signs or symptoms such as difficulty voiding or producing urine, peeing blood, non-functioning kidney, kidney stones, difficulty emptying the bladder, difficulty controlling the flow of urine, or chronic kidney disease Hematological: No reported hematological signs or symptoms such as prolonged bleeding, low or poor functioning platelets, bruising or bleeding easily, hereditary bleeding problems, low energy levels due to low hemoglobin or being anemic Endocrine: No reported endocrine signs or symptoms such as high or low blood sugar, rapid heart rate due to high thyroid levels, obesity or weight gain due to slow thyroid or thyroid disease Rheumatologic: Joint aches and or swelling due to excess weight (Osteoarthritis) Musculoskeletal: Negative for myasthenia gravis, muscular dystrophy, multiple sclerosis or malignant hyperthermia Work History: Disabled  Allergies  Melanie [name] has No Known Allergies.  Laboratory Chemistry  Inflammation Markers (CRP: Acute Phase) (ESR: Chronic Phase) Lab Results  Component Value Date   LATICACIDVEN 1.99 (H) 01/24/2018                         Rheumatology Markers No results found for: RF, ANA, LABURIC, URICUR, LYMEIGGIGMAB, LYMEABIGMQN, HLAB27                      Renal Function Markers Lab Results  Component Value Date   BUN 8 01/27/2018   CREATININE 0.75 01/27/2018   GFRAA >60 01/27/2018   GFRNONAA >60 01/27/2018                             Hepatic Function Markers Lab Results  Component Value Date   AST 21 01/26/2018   ALT 12 01/26/2018   ALBUMIN 2.1 (L) 01/26/2018   ALKPHOS 87 01/26/2018   AMYLASE 39 06/06/2008   LIPASE 48 01/25/2018                         Electrolytes Lab Results  Component Value Date   NA 135 01/27/2018   K 3.7 01/27/2018   CL 103 01/27/2018   CALCIUM 7.8 (L) 01/27/2018   MG 1.9 01/13/2018   PHOS 2.5 01/06/2018                        Neuropathy Markers Lab Results  Component Value Date   VITAMINB12 373 01/25/2018   FOLATE 6.1 01/25/2018   HGBA1C 6.1 (H) 01/11/2018   HIV Non Reactive 01/03/2018                        Coagulation Parameters Lab Results  Component  Value Date   INR 0.99 04/02/2009   LABPROT 13.0 04/02/2009   APTT 22 (L) 04/02/2009   PLT 151 01/29/2018                        Cardiovascular Markers Lab Results  Component Value Date   CKTOTAL 784 (H) 11/23/2011   CKMB 7.5 (HH) 11/23/2011   TROPONINI <0.03 04/17/2017   HGB 8.0 (L) 01/29/2018   HCT 26.2 (L) 01/29/2018                         ID Markers Lab Results  Component Value Date   HIV Non Reactive 01/03/2018                        CA Markers No results found for: CEA, CA125, LABCA2                      Endocrine Markers Lab Results  Component Value Date   TSH 0.821 11/27/2011                        Note: Lab results reviewed.  Imaging Review  Lumbosacral Imaging: Lumbar MR wo contrast:  Results for orders placed during the hospital encounter of 01/06/18  MR LUMBAR SPINE WO CONTRAST   Narrative CLINICAL DATA:  Recent fall, low back pain for 2 months  EXAM: MRI LUMBAR SPINE WITHOUT CONTRAST  TECHNIQUE: Multiplanar, multisequence MR imaging of the lumbar spine was performed. No intravenous contrast was administered.  COMPARISON:  None.  FINDINGS: Segmentation:  Standard.  Alignment:  Physiologic.  Vertebrae:  No fracture, evidence of discitis, or bone lesion.  Conus medullaris and cauda equina: Conus extends to the T12-L1 level. Conus and cauda equina appear normal.  Paraspinal and other soft tissues: No acute paraspinal abnormality. Soft tissue edema in the subcutaneous fat along the posterior  aspect of the back.  Disc levels:  Disc spaces: Disc spaces are maintained.  T12-L1: No significant disc bulge. No evidence of neural foraminal stenosis. No central canal stenosis.  L1-L2: No significant disc bulge. No evidence of neural foraminal stenosis. No central canal stenosis.  L2-L3: No significant disc bulge. No evidence of neural foraminal stenosis. No central canal stenosis.  L3-L4: No significant disc bulge. No evidence of neural foraminal stenosis. No central canal stenosis.  L4-L5: No significant disc bulge. No evidence of neural foraminal stenosis. No central canal stenosis.  L5-S1: No significant disc bulge. No evidence of neural foraminal stenosis. No central canal stenosis.  IMPRESSION: 1.  No acute osseous injury of the lumbar spine. 2. No discitis or osteomyelitis of the lumbar spine.   Electronically Signed   By: Kathreen Devoid   On: 01/09/2018 20:31    Results for orders placed during the hospital encounter of 11/23/17  DG Lumbar Spine Complete   Narrative CLINICAL DATA:  Low back pain after fall  EXAM: LUMBAR SPINE - COMPLETE 4+ VIEW  COMPARISON:  CT abdomen/pelvis dated 08/03/2016  FINDINGS: Five lumbar-type vertebral bodies.  Normal lumbar lordosis.  Vertebral body heights are maintained.  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 No spondylolisthesis.  Visualized bony pelvis appears intact.  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 No spondylolisthesis.   Electronically Signed   By: Julian Hy M.Melanie.   On: 11/23/2017 13:40  Foot-R DG Complete:  Results for orders placed during the hospital encounter of 12/07/08  DG Foot Complete Right   Narrative Clinical Data: [place].  Right foot pain.   RIGHT FOOT COMPLETE - 3+ VIEW   Comparison: None   Findings: The joint spaces are maintained.  No fractures are seen.   IMPRESSION: No acute bony findings.  Provider: Claudie Fisherman     Complexity Note: Imaging results reviewed. Results shared with Melanie [name], using [employer] terms.                         [name]  Drug: Melanie [name]  reports no history of drug use. Alcohol:  reports current alcohol use. Tobacco:  reports that she has been smoking cigarettes. She has a 35.00 pack-year smoking history. She has never used smokeless tobacco. Medical:  has a past medical history of Asthma, GERD (gastroesophageal reflux disease), Head injury, acute, Hypertension, Pneumonia, Risk for falls, Short-term memory loss, and SUBDURAL HEMATOMA (04/08/2009). Family: family history includes Diabetes in her father and another family member; Hypertension in her father and another family member.  Past Surgical History:  Procedure Laterality Date  . BIOPSY  01/28/2018   Procedure: BIOPSY;  Surgeon: Danie Binder, [name];  [facility];  Service: Endoscopy;;  duodenum gastric  . BRAIN SURGERY    . ESOPHAGOGASTRODUODENOSCOPY (EGD) WITH PROPOFOL N/A 01/28/2018   Procedure: ESOPHAGOGASTRODUODENOSCOPY (EGD) WITH PROPOFOL;  Surgeon: Danie Binder, [name];  [facility];  Service: Endoscopy;  Laterality: N/A;  . HERNIA REPAIR    . TEE WITHOUT CARDIOVERSION N/A 01/13/2018   Procedure: TRANSESOPHAGEAL ECHOCARDIOGRAM (TEE);  Surgeon: Herminio Commons, [name];  Location: [facility];  Service: Cardiovascular;  Laterality: N/A;   Active Ambulatory Problems    Diagnosis Date Noted  . BIPOLAR DISORDER UNSPECIFIED 04/08/2009  . Substance abuse ([place]) 09/22/2008  . 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 PNEUMONIA 04/08/2009  . Seizure ([place]) 04/08/2009  . Acute respiratory failure ([place]) 11/24/2011  . Pneumococcal pneumonia ([place]) 11/29/2011  . Thrombocytopenia ([place]) 11/29/2011  . Multifocal pneumonia 01/03/2018  . Asthma exacerbation 01/03/2018  . Falls 01/03/2018  . Anemia 01/03/2018  . Hypokalemia 01/03/2018  . AKI (acute kidney injury) ([place]) 01/03/2018  . Hypertension 01/03/2018  .  Lymphopenia 01/03/2018  . CAP (community acquired pneumonia) 01/06/2018  . HCAP (healthcare-associated pneumonia) 01/06/2018  . GERD (gastroesophageal reflux disease) 01/06/2018  . Tobacco use 01/06/2018  . Abnormal EKG 01/06/2018  . Tachycardia 01/06/2018  . Bacteremia due to Staphylococcus aureus 01/09/2018  . MSSA (methicillin susceptible Staphylococcus aureus) pneumonia ([place]) 01/24/2018  . Acute right flank pain 01/24/2018  . Lactic acidosis 01/24/2018  . Right-sided low back pain without sciatica   . Heme positive stool   . Gastritis and gastroduodenitis   . 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 Drug-seeking behavior 07/29/2018   Resolved Ambulatory Problems    Diagnosis Date Noted  . Septic shock(785.52) 11/24/2011   Past Medical History:  Diagnosis Date  . Asthma   . Head injury, acute   . Pneumonia   . Risk for falls   . Short-term memory loss    Assessment  Primary Diagnosis & Pertinent Problem List: The primary encounter diagnosis was Chronic right-sided low back pain without sciatica. 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  Visit Diagnosis (New problems to examiner): 1. Chronic right-sided low back pain without sciatica   2. Pars defect of lumbar spine   3. 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 Bipolar affective disorder, remission status unspecified ([place])   5. Substance abuse (HCC)(cocaine and THC in past)   6. SUBDURAL HEMATOMA   7. Drug-seeking behavior    Patient complaining of low back pain.  She states that this started after a fall that she sustained in August 2019.  X-rays taken after a fall showed L5-S1 pars defect.  Patient continues to have low back pain and an MRI of her lumbar spine was done in October  2019 which was unremarkable.  It did not show any neuroforaminal or central canal stenosis or any sort of lumbar degenerative disease.  Patient states that her pain is very severe.  She has trouble with recall and she is a poor historian.  Patient from the start of the visit started talking about how her current medications are not effective and that she needs something "stronger".  She kept stating that her primary care provider referred her to the pain clinic so that she could be prescribed something stronger.  When attempting to ask her about multimodal analgesics such as gabapentin, patient was very dismissive and states that none of this works.  Patient does have a history of substance abuse, cocaine which she was not forthright about.  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  Patient was dishonest about her intake questionnaire above ([facility]).  She denies any history of substance abuse when she does have documented urine drug screens that are positive for both cocaine and THC on multiple occasions.  ---- After extensive discussion with the patient that was very cyclical in nature, the patient is only interested in opioid medications.  I spent a considerable amount of time discussing membrane stabilizers including gabapentin, Lyrica and Cymbalta and have these medications can be effective long-term for chronic pain management.  Patient was very dismissive when I was asking her about these medications and she would often interrupt me and state "doesn't work" even before I would complete my sentence.  Given the patient's dishonesty about her prior medical history as well as her drug-seeking behavior, I do not have much to offer the patient as she has compromised therapeutic alliance.  There are times where the patient interrupted me while I was talking and it seems that she is  only interested in opioid therapy given after I spent a considerable amount of time discussing non-opioid analgesics as well as other therapeutic options such as physical therapy and L5-S1 facet medial branch nerve block.  However given the patient's response and verbal interaction with me, I do not have much to offer the patient.  Patient can benefit from physical therapy as well as seeing a pain psychologist to help with pain coping biofeedback, cognitive behavior therapy. She was not interested in either of these, saying, "they would not help".  Recommend patient follow back up with primary care provider.   Provider-requested follow-up: No follow-ups on file. Follow up with PCP  Total duration of non-face-to-face encounter: 30 minutes.  Primary Care Physician: Melanie Flavors, [name] Location: [name] Note by: Melanie Santa, [name] Date: 07/29/2018; Time: 10:48 AM

## 2018-07-28 ENCOUNTER — Telehealth: Payer: Self-pay | Admitting: *Deleted

## 2018-07-28 NOTE — Telephone Encounter (Signed)
Left voicemail for patient to call me back to given information for tomorrow's phone visit.

## 2018-07-29 ENCOUNTER — Ambulatory Visit
Payer: Medicaid Other | Attending: Student in an Organized Health Care Education/Training Program | Admitting: Student in an Organized Health Care Education/Training Program

## 2018-07-29 ENCOUNTER — Other Ambulatory Visit: Payer: Self-pay

## 2018-07-29 VITALS — Wt 126.0 lb

## 2018-07-29 DIAGNOSIS — M4300 Spondylolysis, site unspecified: Secondary | ICD-10-CM | POA: Insufficient documentation

## 2018-07-29 DIAGNOSIS — M4306 Spondylolysis, lumbar region: Secondary | ICD-10-CM | POA: Diagnosis not present

## 2018-07-29 DIAGNOSIS — G8929 Other chronic pain: Secondary | ICD-10-CM

## 2018-07-29 DIAGNOSIS — F1910 Other psychoactive substance abuse, uncomplicated: Secondary | ICD-10-CM

## 2018-07-29 DIAGNOSIS — M545 Low back pain, unspecified: Secondary | ICD-10-CM

## 2018-07-29 DIAGNOSIS — F319 Bipolar disorder, unspecified: Secondary | ICD-10-CM | POA: Diagnosis not present

## 2018-07-29 DIAGNOSIS — Z765 Malingerer [conscious simulation]: Secondary | ICD-10-CM | POA: Insufficient documentation

## 2018-07-29 DIAGNOSIS — I6200 Nontraumatic subdural hemorrhage, unspecified: Secondary | ICD-10-CM

## 2018-12-06 IMAGING — CT CT HEAD W/O CM
3 series · 15 of 47 positions shown, 18 images · non-contrast
Comparison: 01/02/2012

CLINICAL DATA: Altered level of consciousness, fell 2 days ago,
abrasions to face, history hypertension and smoking, history
subdural hematoma and surgery

EXAM:
CT HEAD WITHOUT CONTRAST
TECHNIQUE: Contiguous axial images were obtained from the base of the skull
through the vertex without intravenous contrast. Sagittal and
coronal MPR images reconstructed from axial data set.

[Series 2: head trauma wo · axial · 0.47mm/px · z∈[+143,+268]mm · 9 of 30 slices shown, 12 images]
[im 3/30  brain]
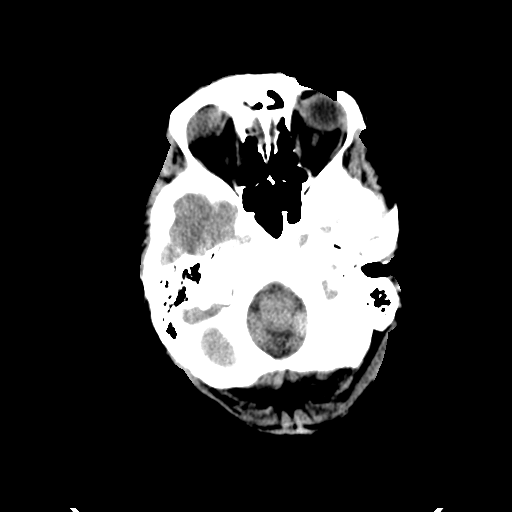
[im 3/30  bone]
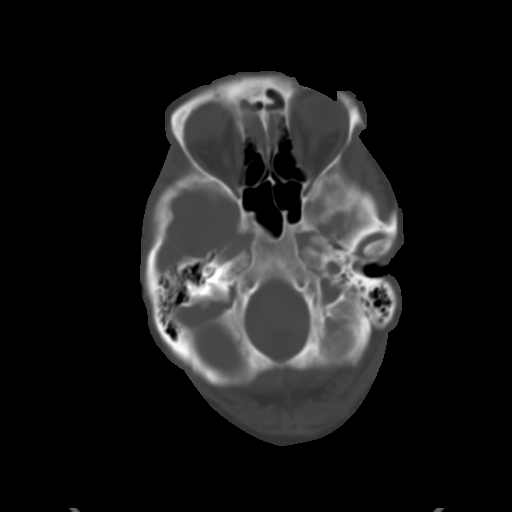
[im 6/30  brain]
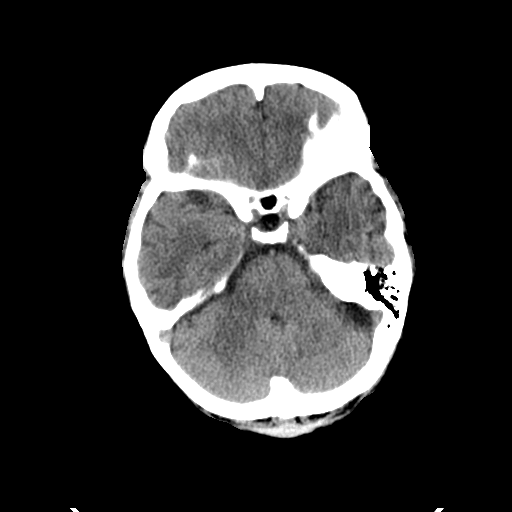
[im 9/30  brain]
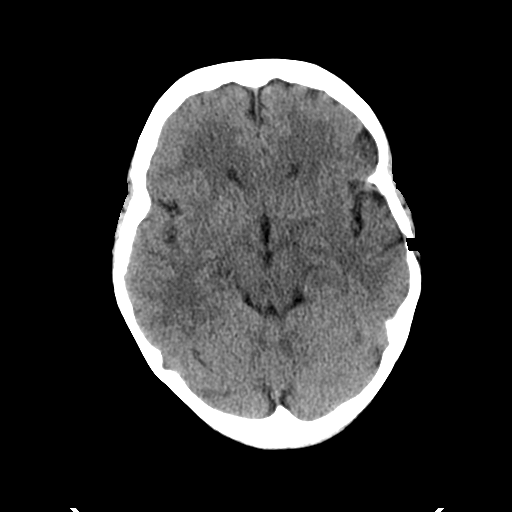
[im 12/30  brain]
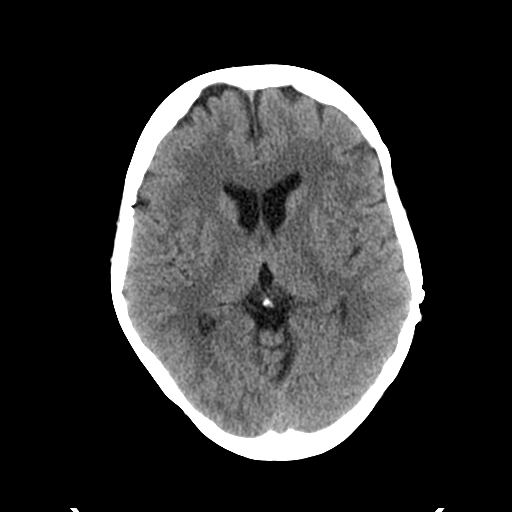
[im 16/30  brain]
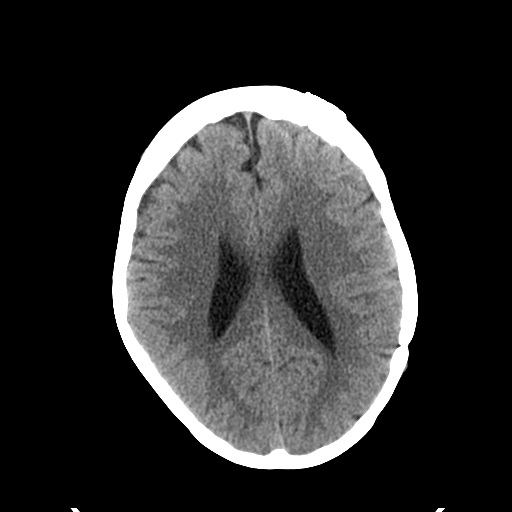
[im 16/30  bone]
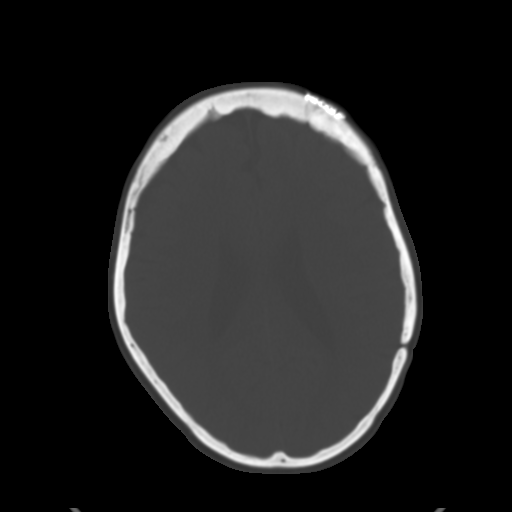
[im 19/30  brain]
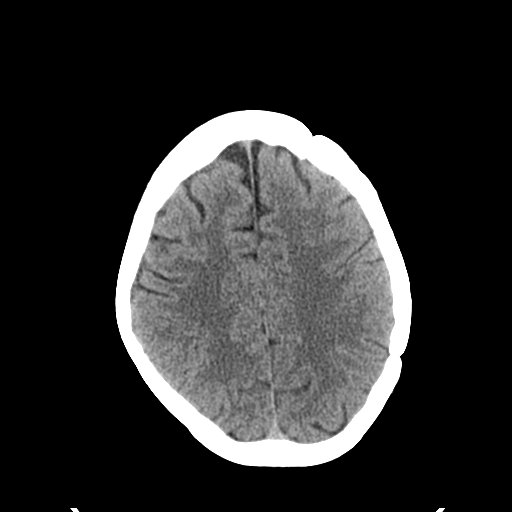
[im 22/30  brain]
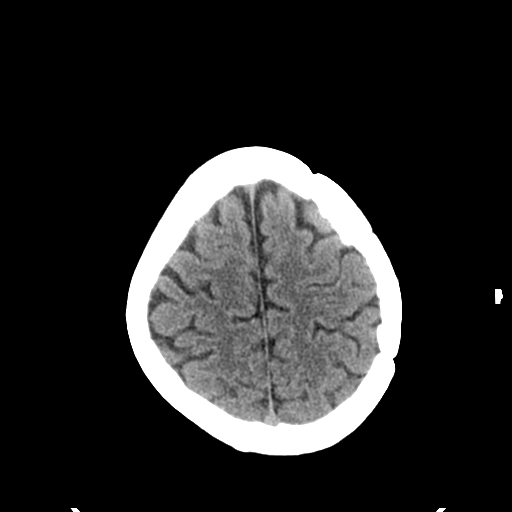
[im 25/30  brain]
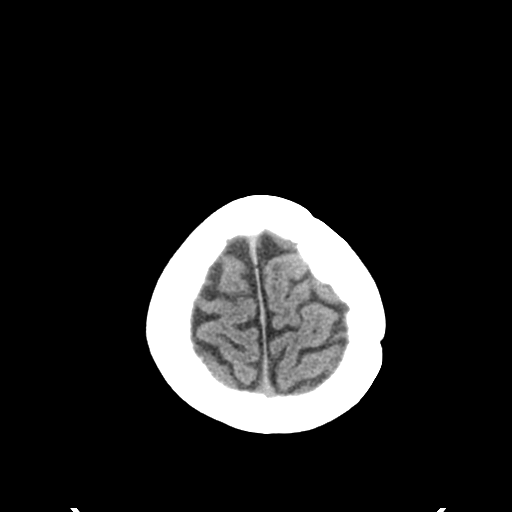
[im 28/30  brain]
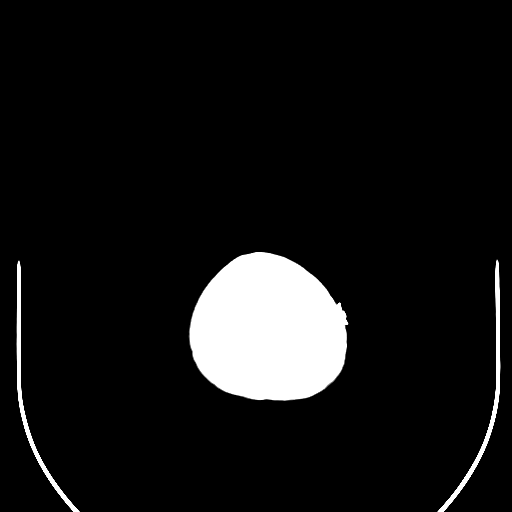
[im 28/30  bone]
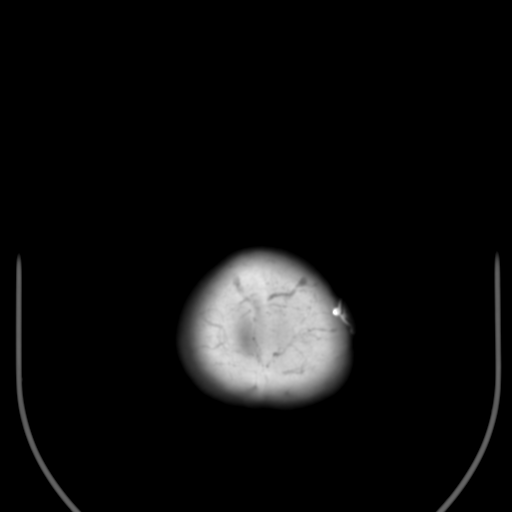

[Series 4: coronal soft tissue · coronal · 0.28mm/px · 3 of 67 slices shown]
[im 23/67  brain]
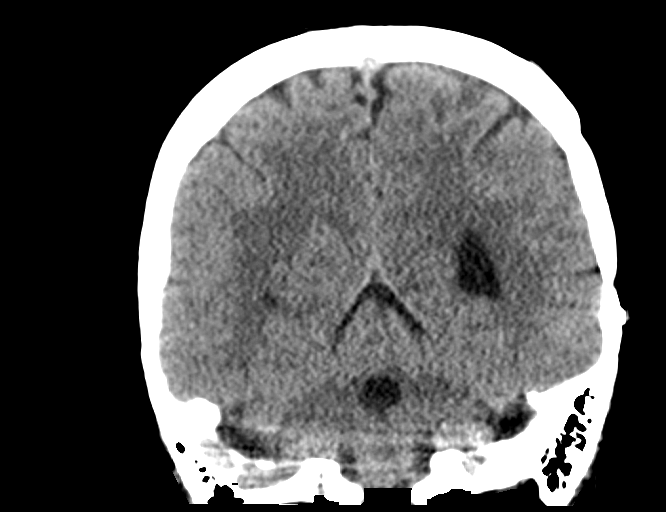
[im 30/67  brain]
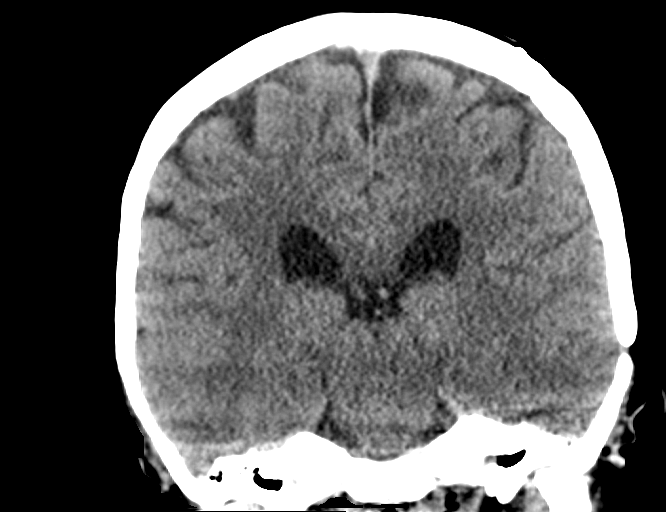
[im 37/67  brain]
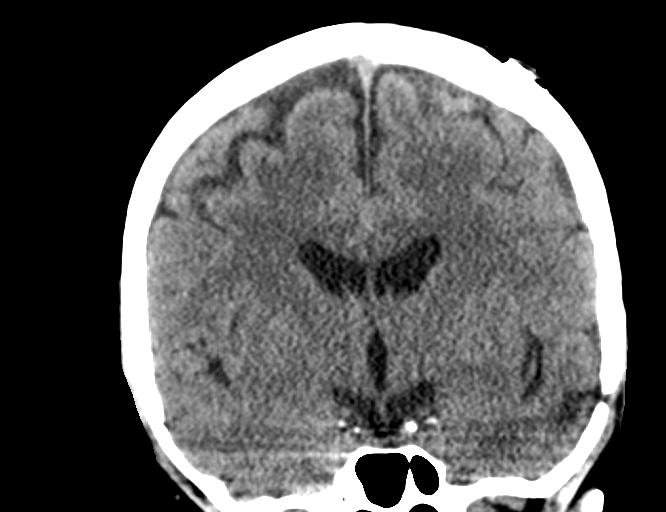

[Series 5: sagittal soft tissue · sagittal · 0.30mm/px · 3 of 54 slices shown]
[im 18/54  brain]
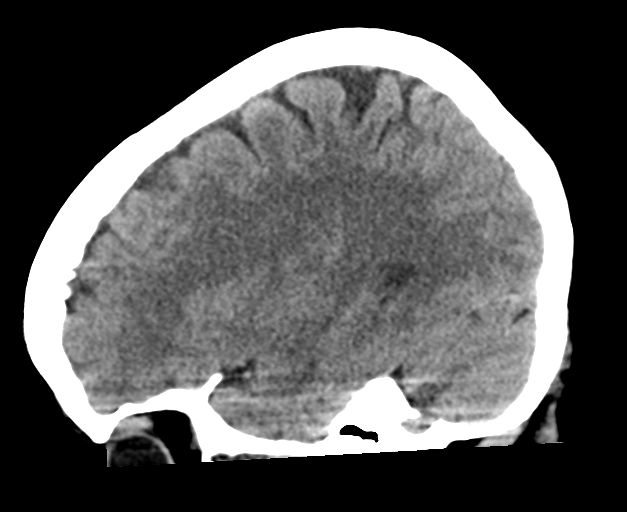
[im 27/54  brain]
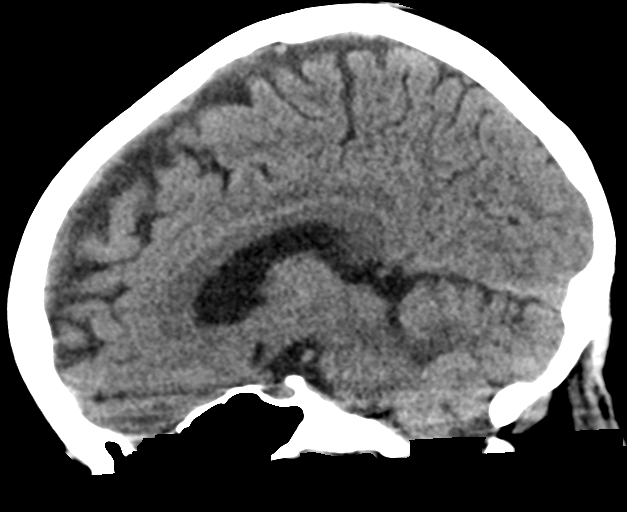
[im 36/54  brain]
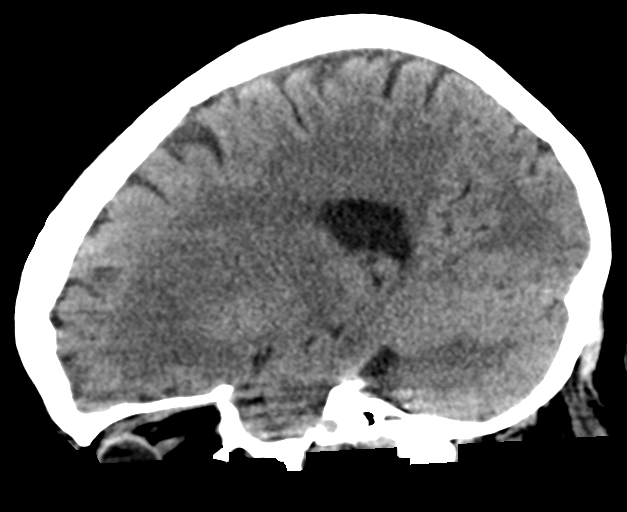

[15 of 47 positions shown; findings below may reference images not displayed]

FINDINGS: Brain: Normal ventricular morphology. No midline shift or mass
effect. Small old infarct LEFT cerebellar hemisphere. Otherwise
normal appearance of brain parenchyma. No intracranial hemorrhage,
mass lesion or evidence of acute infarction. No extra-axial fluid
collections.

Vascular: Unremarkable

Skull: Prior LEFT frontotemporal parietal craniotomy. No acute
osseous findings.

Sinuses/Orbits: Visualized paranasal sinuses and mastoid air cells
clear

Other: N/A
IMPRESSION: Small old LEFT cerebellar infarct.

Prior LEFT craniotomy.

No acute intracranial abnormalities.

## 2020-08-30 ENCOUNTER — Encounter (HOSPITAL_COMMUNITY): Payer: Self-pay | Admitting: Emergency Medicine

## 2020-08-30 ENCOUNTER — Other Ambulatory Visit: Payer: Self-pay

## 2020-08-30 ENCOUNTER — Emergency Department (HOSPITAL_COMMUNITY): Payer: Medicaid Other

## 2020-08-30 ENCOUNTER — Emergency Department (HOSPITAL_COMMUNITY)
Admission: EM | Admit: 2020-08-30 | Discharge: 2020-08-30 | Disposition: A | Discharge status: Home / Self Care | Payer: Medicaid Other | Attending: Emergency Medicine | Admitting: Emergency Medicine

## 2020-08-30 DIAGNOSIS — I10 Essential (primary) hypertension: Secondary | ICD-10-CM | POA: Diagnosis not present

## 2020-08-30 DIAGNOSIS — W19XXXA Unspecified fall, initial encounter: Secondary | ICD-10-CM | POA: Diagnosis not present

## 2020-08-30 DIAGNOSIS — M25552 Pain in left hip: Secondary | ICD-10-CM | POA: Insufficient documentation

## 2020-08-30 DIAGNOSIS — J45901 Unspecified asthma with (acute) exacerbation: Secondary | ICD-10-CM | POA: Diagnosis not present

## 2020-08-30 DIAGNOSIS — Z79899 Other long term (current) drug therapy: Secondary | ICD-10-CM | POA: Diagnosis not present

## 2020-08-30 DIAGNOSIS — F17210 Nicotine dependence, cigarettes, uncomplicated: Secondary | ICD-10-CM | POA: Insufficient documentation

## 2020-08-30 DIAGNOSIS — M79672 Pain in left foot: Secondary | ICD-10-CM | POA: Insufficient documentation

## 2020-08-30 MED ORDER — HYDROCODONE-ACETAMINOPHEN 5-325 MG PO TABS
1.0000 | ORAL_TABLET | Freq: Once | ORAL | Status: AC
  Administered 2020-08-30: 1 via ORAL
  Filled 2020-08-30: qty 1

## 2020-08-30 NOTE — Discharge Instructions (Addendum)
It was a pleasure taking care of you today. As discussed all of your x-rays were negative for any broken bones.  You were placed in a splint with crutches as needed for comfort.  Continue to ice and elevate your left foot.  You may take over-the-counter ibuprofen or Tylenol as needed for pain.  Please follow-up with PCP if symptoms not improved within the next week.  Return to the ER for new or worsening symptoms.

## 2020-08-30 NOTE — ED Provider Notes (Signed)
[facility] EMERGENCY DEPARTMENT Provider Note   CSN: [id number] Arrival date & time: 08/30/20  2595     History Chief Complaint  Patient presents with  . Hip Pain    Melanie [name] is a 55 y.o. female with a past medical history as noted below who presents to the ED due to left hip and foot pain. Patient admits to a mechanical fall a few weeks ago landing directly on her right side. No head injury or LOC. She states for the past week, her left his has been bothering her. Pain is worse with ambulation. Pain is non-radiating in nature. Denies low back pain.  Denies saddle paresthesias, bowel/bladder incontinence, lower extremity numbness/tingling, lower extremity weakness.  She has been taking Tylenol with no relief.  Rates her pain 8/10.  Patient also admits to left foot/ankle pain x3 to 4 days.  Patient states she twisted her ankle a few days ago. Pain is worse with movement especially ambulation.  Denies associated numbness/tingling.  She admits to edema that has resolved on the sole of her foot.  Fever and chills.  Denies warmth and erythema to joints.   History obtained from patient and past medical records. No interpreter used during encounter.      Past Medical History:  Diagnosis Date  . Asthma   . GERD (gastroesophageal reflux disease)   . Head injury, acute    History of subdural hematoma following salicylate poisoning.  Marland Kitchen Hypertension   . Pneumonia   . Risk for falls   . Short-term memory loss   . SUBDURAL HEMATOMA 04/08/2009   Qualifier: History of  By: Logan Bores MD, Casimiro Needle      Patient Active Problem List   Diagnosis Date Noted  . 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 Drug-seeking behavior 07/29/2018  . Heme positive stool   . Gastritis and gastroduodenitis   . Right-sided low back pain without sciatica   . MSSA (methicillin susceptible Staphylococcus aureus) pneumonia (HCC) 01/24/2018  . Acute right flank pain 01/24/2018  . Lactic acidosis 01/24/2018   . Bacteremia due to Staphylococcus aureus 01/09/2018  . CAP (community acquired pneumonia) 01/06/2018  . HCAP (healthcare-associated pneumonia) 01/06/2018  . GERD (gastroesophageal reflux disease) 01/06/2018  . Tobacco use 01/06/2018  . Abnormal EKG 01/06/2018  . Tachycardia 01/06/2018  . Multifocal pneumonia 01/03/2018  . Asthma exacerbation 01/03/2018  . Falls 01/03/2018  . Anemia 01/03/2018  . Hypokalemia 01/03/2018  . AKI (acute kidney injury) (HCC) 01/03/2018  . Hypertension 01/03/2018  . Lymphopenia 01/03/2018  . Pneumococcal pneumonia (HCC) 11/29/2011  . Thrombocytopenia (HCC) 11/29/2011  . Acute respiratory failure (HCC) 11/24/2011  . BIPOLAR DISORDER UNSPECIFIED 04/08/2009  . 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 PNEUMONIA 04/08/2009  . Seizure (HCC) 04/08/2009  . Substance abuse (HCC) 09/22/2008    Past Surgical History:  Procedure Laterality Date  . BIOPSY  01/28/2018   Procedure: BIOPSY;  Surgeon: West Bali, MD;  Location: [facility];  Service: Endoscopy;;  duodenum gastric  . BRAIN SURGERY    . ESOPHAGOGASTRODUODENOSCOPY (EGD) WITH PROPOFOL N/A 01/28/2018   Procedure: ESOPHAGOGASTRODUODENOSCOPY (EGD) WITH PROPOFOL;  Surgeon: West Bali, MD;  Location: [facility];  Service: Endoscopy;  Laterality: N/A;  . HERNIA REPAIR    . TEE WITHOUT CARDIOVERSION N/A 01/13/2018   Procedure: TRANSESOPHAGEAL ECHOCARDIOGRAM (TEE);  Surgeon: Laqueta Linden, MD;  Location: [facility];  Service: Cardiovascular;  Laterality: N/A;     OB History    Gravida  5  Para  2   Term  2   Preterm  0   AB  3   Living        SAB  3   IAB  0   Ectopic  0   Multiple      Live Births              Family History  Problem Relation Age of Onset  . Hypertension Father   . Diabetes Father   . Hypertension Other   . Diabetes Other   . Colon cancer Neg Hx   . Colon polyps Neg Hx     Social History   Tobacco Use  . Smoking status: Current Every  Day Smoker    Packs/day: 1.00    Years: 35.00    Pack years: 35.00    Types: Cigarettes  . Smokeless tobacco: Never Used  Vaping Use  . Vaping Use: Never used  Substance Use Topics  . Alcohol use: Yes    Comment: occas  . Drug use: No    Comment: pt snorts px meds.    Home Medications Prior to Admission medications   Medication Sig Start Date End Date Taking? Authorizing Provider  albuterol (PROVENTIL HFA;VENTOLIN HFA) 108 (90 Base) MCG/ACT inhaler Inhale 2 puffs into the lungs every 4 (four) hours as needed for wheezing or shortness of breath (or coughing). 05/14/17   Dione Booze, MD  cyclobenzaprine (FLEXERIL) 10 MG tablet Take 10 mg by mouth 3 (three) times daily as needed. 06/24/18   [provider]  gabapentin (NEURONTIN) 300 MG capsule Take 300 mg by mouth at bedtime. 04/17/18   [provider]  metoprolol tartrate (LOPRESSOR) 25 MG tablet Take 0.5 tablets (12.5 mg total) by mouth 2 (two) times daily. 01/29/18 02/28/18  Erick Blinks, MD  ondansetron (ZOFRAN ODT) 4 MG disintegrating tablet Take 1 tablet (4 mg total) by mouth every 8 (eight) hours as needed for nausea or vomiting. 01/29/18   Erick Blinks, MD  oxyCODONE-acetaminophen (PERCOCET/ROXICET) 5-325 MG tablet Take 1 tablet by mouth daily. 06/25/18   [provider]    Allergies    Patient has no known allergies.  Review of Systems   Review of Systems  Constitutional: Negative for chills and fever.  Musculoskeletal: Positive for arthralgias. Negative for back pain and joint swelling.  Skin: Negative for color change.    Physical Exam Updated Vital Signs BP (!) 166/135   Pulse 85   Temp 98.7 F (37.1 C) (Oral)   Resp 16   Ht 4\' 11"  (1.499 m)   Wt 59 kg   LMP 12/26/2011   SpO2 98%   BMI 26.26 kg/m   Physical Exam Vitals and nursing note reviewed.  Constitutional:      General: She is not in acute distress.    Appearance: She is not ill-appearing.  HENT:     Head:  Normocephalic.  Eyes:     Pupils: Pupils are equal, round, and reactive to light.  Cardiovascular:     Rate and Rhythm: Normal rate and regular rhythm.     Pulses: Normal pulses.     Heart sounds: Normal heart sounds. No murmur heard. No friction rub. No gallop.   Pulmonary:     Effort: Pulmonary effort is normal.     Breath sounds: Normal breath sounds.  Abdominal:     General: Abdomen is flat. There is no distension.     Palpations: Abdomen is soft.     Tenderness: There  is no abdominal tenderness. There is no guarding or rebound.  Musculoskeletal:        General: Normal range of motion.     Cervical back: Neck supple.     Comments: No thoracic or lumbar midline tenderness.  Tenderness throughout left hip.  Decreased range of motion of left hip due to pain.  No erythema, edema, or warmth.  Tenderness throughout dorsum of left foot and medial aspect of left ankle without edema, erythema, or warmth.  Decreased range of motion of left ankle.  Pedal pulses palpable.  Skin:    General: Skin is warm and dry.  Neurological:     General: No focal deficit present.     Mental Status: She is alert.  Psychiatric:        Mood and Affect: Mood normal.        Behavior: Behavior normal.     ED Results / Procedures / Treatments   Labs (all labs ordered are listed, but only abnormal results are displayed) Labs Reviewed - No data to display  EKG None  Radiology DG Ankle Complete Left  Result Date: 08/30/2020 CLINICAL DATA:  Fall, pain EXAM: LEFT ANKLE COMPLETE - 3+ VIEW COMPARISON:  None. FINDINGS: There is no evidence of fracture, dislocation, or joint effusion. There is no evidence of arthropathy or other focal bone abnormality. Diffuse soft tissue edema about the ankle. IMPRESSION: No fracture or dislocation of the left ankle. Diffuse soft tissue edema about the ankle. Electronically Signed   By: Lauralyn PrimesAlex  Bibbey M.D.   On: 08/30/2020 13:11   DG Foot Complete Left  Result Date:  08/30/2020 CLINICAL DATA:  Fall. EXAM: LEFT FOOT - COMPLETE 3+ VIEW COMPARISON:  No recent. FINDINGS: Diffuse soft tissue swelling. No acute bony or joint abnormality. No evidence of fracture or dislocation. IMPRESSION: Diffuse soft tissue swelling.  No acute bony abnormality. Electronically Signed   By: Maisie Fushomas  Register   On: 08/30/2020 13:08   DG Hip Unilat W or Wo Pelvis 2-3 Views Left  Result Date: 08/30/2020 CLINICAL DATA:  Fall, LEFT hip pain also with swelling of LEFT foot and ankle. EXAM: DG HIP (WITH OR WITHOUT PELVIS) 2-3V LEFT COMPARISON:  No recent comparison is. Prior pelvic evaluation from 2010. FINDINGS: No signs of fracture about the bony pelvis. LEFT hip is located without sign of fracture or dislocation. IMPRESSION: No acute findings. Electronically Signed   By: Donzetta KohutGeoffrey  Wile M.D.   On: 08/30/2020 13:10    Procedures Procedures   Medications Ordered in ED Medications  HYDROcodone-acetaminophen (NORCO/VICODIN) 5-325 MG per tablet 1 tablet (1 tablet Oral Given 08/30/20 1232)    ED Course  I have reviewed the triage vital signs and the nursing notes.  Pertinent labs & imaging results that were available during my care of the patient were reviewed by me and considered in my medical decision making (see chart for details).    MDM Rules/Calculators/A&P                         55 year old female presents to the ED due to left hip and left foot/ankle pain after a fall a few weeks ago. No head injury or LOC. No fever or chills. Upon arrival, stable vitals. Reproducible tenderness at left hip and dorsum of left foot.  No erythema, edema, warmth.  Doubt septic joint.  X-rays ordered to rule out bony fractures given injury.  Hydrocodone given for pain management.  X-rays personally reviewed which are  negative for any bony fractures.  Mild soft tissue edema around the ankle. Patient able to ambulate in the ED without difficulty. Doubt occult fracture. Patient placed in left ankle brace  and crutches given for comfort. RICE discussed with patient.  Advised patient to follow-up with PCP if symptoms not improved within the next week.  Over-the-counter Tylenol or ibuprofen as needed for pain. Strict ED precautions discussed with patient. Patient states understanding and agrees to plan. Patient discharged home in no acute distress and stable vitals Final Clinical Impression(s) / ED Diagnoses Final diagnoses:  Left hip pain  Left foot pain    Rx / DC Orders ED Discharge Orders    None       Jesusita Oka 08/30/20 1333    Vanetta Mulders, MD 09/01/20 [phone]

## 2020-08-30 NOTE — ED Notes (Signed)
Pt refused crutches, PA informed.

## 2020-08-30 NOTE — ED Triage Notes (Signed)
C/o left hip from a fall a couple weeks ago.  Rates pain 8/10.

## 2020-11-10 ENCOUNTER — Emergency Department (HOSPITAL_COMMUNITY): Payer: Medicaid Other

## 2020-11-10 ENCOUNTER — Encounter (HOSPITAL_COMMUNITY): Payer: Self-pay | Admitting: *Deleted

## 2020-11-10 ENCOUNTER — Other Ambulatory Visit: Payer: Self-pay

## 2020-11-10 ENCOUNTER — Emergency Department (HOSPITAL_COMMUNITY)
Admission: EM | Admit: 2020-11-10 | Discharge: 2020-11-10 | Disposition: A | Discharge status: Home / Self Care | Payer: Medicaid Other | Attending: Emergency Medicine | Admitting: Emergency Medicine

## 2020-11-10 DIAGNOSIS — Z79899 Other long term (current) drug therapy: Secondary | ICD-10-CM | POA: Insufficient documentation

## 2020-11-10 DIAGNOSIS — J45901 Unspecified asthma with (acute) exacerbation: Secondary | ICD-10-CM | POA: Insufficient documentation

## 2020-11-10 DIAGNOSIS — L0291 Cutaneous abscess, unspecified: Secondary | ICD-10-CM

## 2020-11-10 DIAGNOSIS — N644 Mastodynia: Secondary | ICD-10-CM | POA: Diagnosis present

## 2020-11-10 DIAGNOSIS — I10 Essential (primary) hypertension: Secondary | ICD-10-CM | POA: Diagnosis not present

## 2020-11-10 DIAGNOSIS — N611 Abscess of the breast and nipple: Secondary | ICD-10-CM | POA: Insufficient documentation

## 2020-11-10 DIAGNOSIS — F17210 Nicotine dependence, cigarettes, uncomplicated: Secondary | ICD-10-CM | POA: Insufficient documentation

## 2020-11-10 LAB — BASIC METABOLIC PANEL
Anion gap: 7 (ref 5–15)
BUN: 7 mg/dL (ref 6–20)
CO2: 29 mmol/L (ref 22–32)
Calcium: 8.7 mg/dL — ABNORMAL LOW (ref 8.9–10.3)
Chloride: 102 mmol/L (ref 98–111)
Creatinine, Ser: 0.88 mg/dL (ref 0.44–1.00)
GFR, Estimated: >60 mL/min (ref >60)
Glucose, Bld: 104 mg/dL — ABNORMAL HIGH (ref 70–99)
Potassium: 3.2 mmol/L — ABNORMAL LOW (ref 3.5–5.1)
Sodium: 138 mmol/L (ref 135–145)

## 2020-11-10 LAB — CBC WITH DIFFERENTIAL/PLATELET
Abs Immature Granulocytes: 0.03 10*3/uL (ref 0.00–0.07)
Basophils Absolute: 0 10*3/uL (ref 0.0–0.1)
Basophils Relative: 1 %
Eosinophils Absolute: 0.2 10*3/uL (ref 0.0–0.5)
Eosinophils Relative: 3 %
HCT: 40.8 % (ref 36.0–46.0)
Hemoglobin: 13.1 g/dL (ref 12.0–15.0)
Immature Granulocytes: 1 %
Lymphocytes Relative: 27 %
Lymphs Abs: 1.7 10*3/uL (ref 0.7–4.0)
MCH: 28.9 pg (ref 26.0–34.0)
MCHC: 32.1 g/dL (ref 30.0–36.0)
MCV: 89.9 fL (ref 80.0–100.0)
Monocytes Absolute: 0.4 10*3/uL (ref 0.1–1.0)
Monocytes Relative: 7 %
Neutro Abs: 3.8 10*3/uL (ref 1.7–7.7)
Neutrophils Relative %: 61 %
Platelets: 240 10*3/uL (ref 150–400)
RBC: 4.54 MIL/uL (ref 3.87–5.11)
RDW: 13.8 % (ref 11.5–15.5)
WBC: 6.1 10*3/uL (ref 4.0–10.5)
nRBC: 0 % (ref 0.0–0.2)

## 2020-11-10 MED ORDER — HYDROCODONE-ACETAMINOPHEN 5-325 MG PO TABS: ORAL_TABLET | ORAL | 0 refills | Status: AC

## 2020-11-10 MED ORDER — OXYCODONE-ACETAMINOPHEN 5-325 MG PO TABS
1.0000 | ORAL_TABLET | Freq: Once | ORAL | Status: AC
  Administered 2020-11-10: 1 via ORAL
  Filled 2020-11-10: qty 1

## 2020-11-10 NOTE — ED Notes (Signed)
ED Provider at bedside. 

## 2020-11-10 NOTE — ED Triage Notes (Signed)
States she has a knot in her right breast x 4 days

## 2020-11-10 NOTE — ED Provider Notes (Signed)
[facility] Provider Note   CSN: [id number] Arrival date & time: 11/10/20  1239     History No chief complaint on file.   Melanie [name] is a 55 y.o. female.  HPI      Melanie MemoDeborah [name] is a 55 y.o. female with past medical history of hypertension TBI who presents to the Emergency Department complaining of pain, redness and swelling of the lateral right breast.  She noticed symptoms days ago.  She describes significant tenderness to palpation of the area.  She was seen at PCPs office on Monday and prescribed Augmentin which she is taking twice daily, no significant improvement of her symptoms.  She states the area feels hot to touch.  She denies any pain or drainage from her nipple.  Patient is a current smoker and has never had a mammogram.  She contacted PCP and advised to come to ER for further evaluation.    Past Medical History:  Diagnosis Date   Asthma    GERD (gastroesophageal reflux disease)    Head injury, acute    History of subdural hematoma following salicylate poisoning.   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   Drug-seeking behavior 07/29/2018   Heme positive stool    Gastritis and gastroduodenitis    Right-sided low back pain without sciatica    MSSA (methicillin susceptible Staphylococcus aureus) pneumonia (HCC) 01/24/2018   Acute right flank pain 01/24/2018   Lactic acidosis 01/24/2018   Bacteremia due to Staphylococcus aureus 01/09/2018   CAP (community acquired pneumonia) 01/06/2018   HCAP (healthcare-associated pneumonia) 01/06/2018   GERD (gastroesophageal reflux disease) 01/06/2018   Tobacco use 01/06/2018   Abnormal EKG 01/06/2018   Tachycardia 01/06/2018   Multifocal pneumonia 01/03/2018   Asthma  exacerbation 01/03/2018   Falls 01/03/2018   Anemia 01/03/2018   Hypokalemia 01/03/2018   AKI (acute kidney injury) (HCC) 01/03/2018   Hypertension 01/03/2018   Lymphopenia 01/03/2018   Pneumococcal pneumonia (HCC) 11/29/2011   Thrombocytopenia (HCC) 11/29/2011   Acute respiratory failure (HCC) 11/24/2011   BIPOLAR DISORDER UNSPECIFIED 04/08/2009   SUBDURAL HEMATOMA 04/08/2009   PNEUMONIA 04/08/2009   Seizure (HCC) 04/08/2009   Substance abuse (HCC) 09/22/2008    Past Surgical History:  Procedure Laterality Date   BIOPSY  01/28/2018   Procedure: BIOPSY;  Surgeon: West BaliFields, Sandi L, MD;  Location: [facility];  Service: Endoscopy;;  duodenum gastric   BRAIN SURGERY     ESOPHAGOGASTRODUODENOSCOPY (EGD) WITH PROPOFOL N/A 01/28/2018   Procedure: ESOPHAGOGASTRODUODENOSCOPY (EGD) WITH PROPOFOL;  Surgeon: West BaliFields, Sandi L, MD;  Location: [facility];  Service: Endoscopy;  Laterality: N/A;   HERNIA REPAIR     TEE WITHOUT CARDIOVERSION N/A 01/13/2018   Procedure: TRANSESOPHAGEAL ECHOCARDIOGRAM (TEE);  Surgeon: Laqueta LindenKoneswaran, Suresh A, MD;  Location: [facility];  Service: Cardiovascular;  Laterality: N/A;     OB History     Gravida  5   Para  2   Term  2   Preterm  0   AB  3   Living         SAB  3   IAB  0   Ectopic  0   Multiple      Live Births  Family History  Problem Relation Age of Onset   Hypertension Father    Diabetes Father    Hypertension Other    Diabetes Other    Colon cancer Neg Hx    Colon polyps Neg Hx     Social History   Tobacco Use   Smoking status: Every Day    Packs/day: 1.00    Years: 35.00    Pack years: 35.00    Types: Cigarettes   Smokeless tobacco: Never  Vaping Use   Vaping Use: Never used  Substance Use Topics   Alcohol use: Yes    Comment: occas   Drug use: No    Comment: pt snorts px meds.    Home Medications Prior to Admission medications   Medication Sig Start Date End Date Taking?  Authorizing Provider  albuterol (PROVENTIL HFA;VENTOLIN HFA) 108 (90 Base) MCG/ACT inhaler Inhale 2 puffs into the lungs every 4 (four) hours as needed for wheezing or shortness of breath (or coughing). 05/14/17   Dione Booze, MD  cyclobenzaprine (FLEXERIL) 10 MG tablet Take 10 mg by mouth 3 (three) times daily as needed. 06/24/18   [provider]  gabapentin (NEURONTIN) 300 MG capsule Take 300 mg by mouth at bedtime. 04/17/18   [provider]  metoprolol tartrate (LOPRESSOR) 25 MG tablet Take 0.5 tablets (12.5 mg total) by mouth 2 (two) times daily. 01/29/18 02/28/18  Erick Blinks, MD  ondansetron (ZOFRAN ODT) 4 MG disintegrating tablet Take 1 tablet (4 mg total) by mouth every 8 (eight) hours as needed for nausea or vomiting. 01/29/18   Erick Blinks, MD  oxyCODONE-acetaminophen (PERCOCET/ROXICET) 5-325 MG tablet Take 1 tablet by mouth daily. 06/25/18   [provider]    Allergies    Patient has no known allergies.  Review of Systems   Review of Systems  Constitutional:  Negative for chills, fatigue and fever.  Respiratory:  Negative for shortness of breath.        Right breast pain, redness and swelling  Cardiovascular:  Negative for chest pain and palpitations.  Gastrointestinal:  Negative for abdominal pain, blood in stool, nausea and vomiting.  Genitourinary:  Negative for dysuria and flank pain.  Musculoskeletal:  Negative for arthralgias, back pain, myalgias, neck pain and neck stiffness.  Skin:  Negative for rash.  Neurological:  Negative for dizziness, weakness, numbness and headaches.  Hematological:  Does not bruise/bleed easily.   Physical Exam Updated Vital Signs BP (!) 145/98 (BP Location: Right Arm)   Pulse 94   Temp 98.2 F (36.8 C) (Oral)   Resp 20   LMP 12/26/2011   SpO2 96%   Physical Exam Vitals and nursing note reviewed.  Constitutional:      General: She is not in acute distress.    Appearance: Normal appearance.  HENT:      Mouth/Throat:     Mouth: Mucous membranes are moist.  Cardiovascular:     Rate and Rhythm: Normal rate and regular rhythm.     Pulses: Normal pulses.  Pulmonary:     Effort: Pulmonary effort is normal.  Chest:  Breasts:    Right: Mass, skin change and tenderness present. No bleeding or nipple discharge.     Left: No swelling, inverted nipple, mass, nipple discharge or tenderness.     Comments: mild erythema of lateral breast with significant 4 to 5 cm palpable mass to lateral right breast near the areola.  Nipple is not inverted, no discharge noted.  No palpable axillary lymphadenopathy. Abdominal:  Palpations: Abdomen is soft.     Tenderness: There is no abdominal tenderness.  Musculoskeletal:        General: Normal range of motion.     Cervical back: Normal range of motion. No rigidity.  Lymphadenopathy:     Cervical: No cervical adenopathy.     Upper Body:     Right upper body: No axillary adenopathy.  Skin:    General: Skin is warm.     Capillary Refill: Capillary refill takes less than 2 seconds.  Neurological:     General: No focal deficit present.     Mental Status: She is alert.     Sensory: No sensory deficit.     Motor: No weakness.    ED Results / Procedures / Treatments   Labs (all labs ordered are listed, but only abnormal results are displayed) Labs Reviewed  BASIC METABOLIC PANEL - Abnormal; Notable for the following components:      Result Value   Potassium 3.2 (*)    Glucose, Bld 104 (*)    Calcium 8.7 (*)    All other components within normal limits  CBC WITH DIFFERENTIAL/PLATELET    EKG None  Radiology   CLINICAL DATA: 55 year old female presenting to the emergency room with palpable right breast lump for 4 days. She developed a palpable lump with some soreness and erythema in the lateral right breast on Sunday. She saw her primary care physician on Monday who prescribed Augmentin. EXAM: ULTRASOUND OF THE RIGHT BREAST COMPARISON: No  prior ultrasounds available for comparison. Correlation made with prior mammogram images. FINDINGS: Ultrasound of the right breast at 9 o'clock, 3 cm from the nipple demonstrates a near anechoic oval circumscribed mass measuring 1.2 x 1.0 x 1.1 cm. Ultrasound of the right breast at 8:30, 6 cm from the nipple demonstrates a complex mass versus a complicated cyst with adherent mural debris measuring 2.6 x 1.6 x 2.4 cm. IMPRESSION: 1. Complex mass versus complicated cyst in the right breast at 8:30. 2. Probable benign cyst in the right breast at 9 o'clock. RECOMMENDATION: 1. Bilateral diagnostic mammogram is recommended (as the patient is overdue), with targeted right breast ultrasound at a dedicated breast center is recommended, followed by ultrasound-guided aspiration/biopsy of one and possibly both masses in the right breast. These findings were communicated to the ordering provider, Greogry Goodwyn at 4:03 p.m.. I have discussed the findings and recommendations with the patient. If applicable, a reminder letter will be sent to the patient regarding the next appointment. BI-RADS CATEGORY 4: Suspicious  Procedures Procedures   Medications Ordered in ED Medications  oxyCODONE-acetaminophen (PERCOCET/ROXICET) 5-325 MG per tablet 1 tablet (1 tablet Oral Given 11/10/20 1426)    ED Course  I have reviewed the triage vital signs and the nursing notes.  Pertinent labs & imaging results that were available during my care of the patient were reviewed by me and considered in my medical decision making (see chart for details).    MDM Rules/Calculators/A&P                           Patient here for evaluation of mass, redness, and pain of the lateral right breast.  Symptoms present x4 days.  Seen by PCP and started on Augmentin which she is currently taking.  When she contacted PCPs office for recheck she was advised to come to emergency department for further evaluation.  On my exam,  patient has palpable firm mass of the  lateral breast near the areola.  There is some mild erythema noted that extends laterally.  No concerning axillary lymphadenopathy.  Labs interpreted by me, no leukocytosis, chemistries show mild hypokalemia.  Vital signs reassuring.  Patient is nontoxic-appearing.  I was called by radiologist with ultrasound results. Pt noted to have complex mass versus complicated cyst of the right breast at the 830 and a benign appearing cyst at 9:00 positions.  Bilateral diagnostic mammogram is recommended.  Patient is agreeable to close follow-up with her PCP to arrange her mammogram to be done at the breast center in [place].  She will continue her Augmentin as directed, I will provide short course of pain medication.  Patient verbalized understanding of recommendations and agrees to plan.   Final Clinical Impression(s) / ED Diagnoses Final diagnoses:  Abscess  Breast pain in female    Rx / DC Orders ED Discharge Orders     None        Pauline Aus, PA-C 11/10/20 1645    Bethann Berkshire, MD 11/13/20 [phone]

## 2020-11-10 NOTE — Discharge Instructions (Addendum)
As discussed, continue your antibiotic and take as directed until it is finished.  You may continue alternating warm wet compresses to your breast.  It is very important that you contact the breast center to arrange a follow-up appointment.  If your insurance requires a referral, your primary care provider should be able to make the referral for you.

## 2020-11-22 ENCOUNTER — Other Ambulatory Visit: Payer: Self-pay | Admitting: Hematology and Oncology

## 2020-11-22 ENCOUNTER — Other Ambulatory Visit: Payer: Self-pay | Admitting: Family Medicine

## 2020-11-22 DIAGNOSIS — N6310 Unspecified lump in the right breast, unspecified quadrant: Secondary | ICD-10-CM

## 2020-11-30 ENCOUNTER — Other Ambulatory Visit: Payer: Self-pay | Admitting: Family Medicine

## 2020-11-30 DIAGNOSIS — N6001 Solitary cyst of right breast: Secondary | ICD-10-CM

## 2020-12-09 ENCOUNTER — Other Ambulatory Visit: Payer: Medicaid Other

## 2020-12-12 ENCOUNTER — Other Ambulatory Visit: Payer: Medicaid Other

## 2020-12-21 ENCOUNTER — Other Ambulatory Visit: Payer: Self-pay

## 2020-12-21 ENCOUNTER — Ambulatory Visit
Admission: RE | Admit: 2020-12-21 | Discharge: 2020-12-21 | Disposition: A | Discharge status: Home / Self Care | Payer: Medicaid Other | Source: Ambulatory Visit | Attending: Family Medicine | Admitting: Family Medicine

## 2020-12-21 ENCOUNTER — Other Ambulatory Visit: Payer: Self-pay | Admitting: Family Medicine

## 2020-12-21 DIAGNOSIS — N6001 Solitary cyst of right breast: Secondary | ICD-10-CM

## 2022-10-25 ENCOUNTER — Other Ambulatory Visit: Payer: Self-pay | Admitting: Internal Medicine

## 2022-10-25 DIAGNOSIS — Z87891 Personal history of nicotine dependence: Secondary | ICD-10-CM

## 2022-12-11 ENCOUNTER — Other Ambulatory Visit: Payer: Self-pay

## 2022-12-11 DIAGNOSIS — Z8719 Personal history of other diseases of the digestive system: Secondary | ICD-10-CM

## 2022-12-11 DIAGNOSIS — R768 Other specified abnormal immunological findings in serum: Secondary | ICD-10-CM

## 2023-03-08 ENCOUNTER — Other Ambulatory Visit (HOSPITAL_COMMUNITY): Payer: Self-pay | Admitting: Internal Medicine

## 2023-03-08 DIAGNOSIS — Z1231 Encounter for screening mammogram for malignant neoplasm of breast: Secondary | ICD-10-CM

## 2023-03-14 ENCOUNTER — Other Ambulatory Visit (HOSPITAL_COMMUNITY): Payer: Self-pay | Admitting: Internal Medicine

## 2023-03-14 DIAGNOSIS — R1011 Right upper quadrant pain: Secondary | ICD-10-CM

## 2023-05-02 ENCOUNTER — Encounter (HOSPITAL_COMMUNITY): Payer: Self-pay

## 2023-05-02 ENCOUNTER — Ambulatory Visit (HOSPITAL_COMMUNITY)
Admission: RE | Admit: 2023-05-02 | Discharge: 2023-05-02 | Disposition: A | Discharge status: Home / Self Care | Payer: Medicaid Other | Source: Ambulatory Visit | Attending: Internal Medicine | Admitting: Internal Medicine

## 2023-05-02 DIAGNOSIS — Z1231 Encounter for screening mammogram for malignant neoplasm of breast: Secondary | ICD-10-CM | POA: Insufficient documentation

## 2023-05-07 ENCOUNTER — Other Ambulatory Visit (HOSPITAL_COMMUNITY): Payer: Self-pay | Admitting: Internal Medicine

## 2023-05-07 ENCOUNTER — Ambulatory Visit (HOSPITAL_COMMUNITY): Admission: RE | Admit: 2023-05-07 | Payer: Medicaid Other | Source: Ambulatory Visit

## 2023-05-07 DIAGNOSIS — R928 Other abnormal and inconclusive findings on diagnostic imaging of breast: Secondary | ICD-10-CM

## 2023-05-07 DIAGNOSIS — N63 Unspecified lump in unspecified breast: Secondary | ICD-10-CM

## 2023-06-04 ENCOUNTER — Other Ambulatory Visit (HOSPITAL_COMMUNITY): Payer: Self-pay | Admitting: Family Medicine

## 2023-06-04 DIAGNOSIS — R1011 Right upper quadrant pain: Secondary | ICD-10-CM

## 2023-07-02 ENCOUNTER — Ambulatory Visit (HOSPITAL_COMMUNITY): Admission: RE | Admit: 2023-07-02 | Payer: Medicaid Other | Source: Ambulatory Visit

## 2023-07-02 ENCOUNTER — Encounter (HOSPITAL_COMMUNITY): Payer: Medicaid Other

## 2023-07-02 ENCOUNTER — Ambulatory Visit (HOSPITAL_COMMUNITY): Payer: Medicaid Other

## 2023-07-10 ENCOUNTER — Ambulatory Visit (HOSPITAL_COMMUNITY): Admission: RE | Admit: 2023-07-10 | Source: Ambulatory Visit

## 2023-07-10 ENCOUNTER — Encounter (HOSPITAL_COMMUNITY): Payer: Self-pay

## 2023-07-30 ENCOUNTER — Ambulatory Visit (HOSPITAL_COMMUNITY)

## 2023-07-30 ENCOUNTER — Inpatient Hospital Stay (HOSPITAL_COMMUNITY): Admission: RE | Admit: 2023-07-30 | Source: Ambulatory Visit

## 2023-08-20 ENCOUNTER — Ambulatory Visit (HOSPITAL_COMMUNITY)
Admission: RE | Admit: 2023-08-20 | Discharge: 2023-08-20 | Disposition: A | Discharge status: Home / Self Care | Source: Ambulatory Visit | Attending: Internal Medicine | Admitting: Internal Medicine

## 2023-08-20 DIAGNOSIS — R928 Other abnormal and inconclusive findings on diagnostic imaging of breast: Secondary | ICD-10-CM | POA: Insufficient documentation

## 2023-08-20 DIAGNOSIS — N63 Unspecified lump in unspecified breast: Secondary | ICD-10-CM | POA: Diagnosis present

## 2023-09-11 ENCOUNTER — Ambulatory Visit (HOSPITAL_COMMUNITY): Admission: RE | Admit: 2023-09-11 | Source: Ambulatory Visit
